# Patient Record
Sex: Male | Born: 1935
Health system: Southern US, Community
[De-identification: ages and names within clinical notes are randomized; demographics above are authoritative.]

## PROBLEM LIST (undated history)

## (undated) DIAGNOSIS — K909 Intestinal malabsorption, unspecified: Secondary | ICD-10-CM

## (undated) DIAGNOSIS — H409 Unspecified glaucoma: Secondary | ICD-10-CM

## (undated) DIAGNOSIS — C169 Malignant neoplasm of stomach, unspecified: Secondary | ICD-10-CM

## (undated) DIAGNOSIS — Z86718 Personal history of other venous thrombosis and embolism: Secondary | ICD-10-CM

## (undated) DIAGNOSIS — C801 Malignant (primary) neoplasm, unspecified: Secondary | ICD-10-CM

## (undated) DIAGNOSIS — I2510 Atherosclerotic heart disease of native coronary artery without angina pectoris: Secondary | ICD-10-CM

## (undated) DIAGNOSIS — R410 Disorientation, unspecified: Secondary | ICD-10-CM

## (undated) HISTORY — PX: HERNIA REPAIR: SHX51

## (undated) HISTORY — PX: STOMACH SURGERY: SHX791

---
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
## 1985-10-16 DIAGNOSIS — C801 Malignant (primary) neoplasm, unspecified: Secondary | ICD-10-CM

## 1985-10-16 HISTORY — PX: STOMACH SURGERY: SHX791

## 1985-10-16 HISTORY — DX: Malignant (primary) neoplasm, unspecified: C80.1

## 2007-08-01 ENCOUNTER — Ambulatory Visit: Payer: Self-pay | Admitting: Family Medicine

## 2010-03-19 ENCOUNTER — Ambulatory Visit: Payer: Self-pay | Admitting: Internal Medicine

## 2011-10-18 ENCOUNTER — Ambulatory Visit: Payer: Self-pay | Admitting: Surgery

## 2011-10-18 LAB — CBC WITH DIFFERENTIAL/PLATELET
Basophil %: 0.7 %
Eosinophil #: 0.1 10*3/uL (ref 0.0–0.7)
MCH: 30 pg (ref 26.0–34.0)
MCHC: 32 g/dL (ref 32.0–36.0)
Monocyte #: 0.7 10*3/uL (ref 0.0–0.7)
Monocyte %: 10.8 %
Neutrophil #: 4 10*3/uL (ref 1.4–6.5)
Neutrophil %: 62.5 %
RDW: 13.1 % (ref 11.5–14.5)

## 2011-10-18 LAB — BASIC METABOLIC PANEL
Anion Gap: 6 — ABNORMAL LOW (ref 7–16)
Calcium, Total: 9.1 mg/dL (ref 8.5–10.1)
Co2: 31 mmol/L (ref 21–32)
Creatinine: 0.81 mg/dL (ref 0.60–1.30)
EGFR (African American): >60
EGFR (Non-African Amer.): >60
Osmolality: 280 (ref 275–301)
Sodium: 138 mmol/L (ref 136–145)

## 2011-10-19 ENCOUNTER — Ambulatory Visit: Payer: Self-pay | Admitting: Anesthesiology

## 2011-10-19 DIAGNOSIS — Z01810 Encounter for preprocedural cardiovascular examination: Secondary | ICD-10-CM

## 2011-10-23 ENCOUNTER — Ambulatory Visit: Payer: Self-pay | Admitting: Surgery

## 2012-12-18 ENCOUNTER — Ambulatory Visit: Payer: Self-pay | Admitting: Ophthalmology

## 2013-11-20 DIAGNOSIS — H6120 Impacted cerumen, unspecified ear: Secondary | ICD-10-CM | POA: Diagnosis not present

## 2013-12-04 DIAGNOSIS — H4010X0 Unspecified open-angle glaucoma, stage unspecified: Secondary | ICD-10-CM | POA: Diagnosis not present

## 2014-02-02 DIAGNOSIS — H4010X0 Unspecified open-angle glaucoma, stage unspecified: Secondary | ICD-10-CM | POA: Diagnosis not present

## 2014-02-02 DIAGNOSIS — H2510 Age-related nuclear cataract, unspecified eye: Secondary | ICD-10-CM | POA: Diagnosis not present

## 2014-02-11 ENCOUNTER — Ambulatory Visit: Payer: Self-pay | Admitting: Ophthalmology

## 2014-02-11 DIAGNOSIS — Z85028 Personal history of other malignant neoplasm of stomach: Secondary | ICD-10-CM | POA: Diagnosis not present

## 2014-02-11 DIAGNOSIS — K909 Intestinal malabsorption, unspecified: Secondary | ICD-10-CM | POA: Diagnosis not present

## 2014-02-11 DIAGNOSIS — H2510 Age-related nuclear cataract, unspecified eye: Secondary | ICD-10-CM | POA: Diagnosis not present

## 2014-02-11 DIAGNOSIS — H259 Unspecified age-related cataract: Secondary | ICD-10-CM | POA: Diagnosis not present

## 2014-02-11 DIAGNOSIS — H4010X0 Unspecified open-angle glaucoma, stage unspecified: Secondary | ICD-10-CM | POA: Diagnosis not present

## 2014-02-11 DIAGNOSIS — Z87891 Personal history of nicotine dependence: Secondary | ICD-10-CM | POA: Diagnosis not present

## 2014-02-11 DIAGNOSIS — Z79899 Other long term (current) drug therapy: Secondary | ICD-10-CM | POA: Diagnosis not present

## 2014-02-11 DIAGNOSIS — H409 Unspecified glaucoma: Secondary | ICD-10-CM | POA: Diagnosis not present

## 2014-05-11 DIAGNOSIS — L821 Other seborrheic keratosis: Secondary | ICD-10-CM | POA: Diagnosis not present

## 2014-05-11 DIAGNOSIS — D485 Neoplasm of uncertain behavior of skin: Secondary | ICD-10-CM | POA: Diagnosis not present

## 2014-06-05 DIAGNOSIS — H4010X0 Unspecified open-angle glaucoma, stage unspecified: Secondary | ICD-10-CM | POA: Diagnosis not present

## 2014-06-16 DIAGNOSIS — C44721 Squamous cell carcinoma of skin of unspecified lower limb, including hip: Secondary | ICD-10-CM | POA: Diagnosis not present

## 2014-07-21 DIAGNOSIS — H4011X3 Primary open-angle glaucoma, severe stage: Secondary | ICD-10-CM | POA: Diagnosis not present

## 2014-09-04 DIAGNOSIS — H4011X3 Primary open-angle glaucoma, severe stage: Secondary | ICD-10-CM | POA: Diagnosis not present

## 2014-10-02 DIAGNOSIS — H4011X3 Primary open-angle glaucoma, severe stage: Secondary | ICD-10-CM | POA: Diagnosis not present

## 2014-12-02 DIAGNOSIS — H9319 Tinnitus, unspecified ear: Secondary | ICD-10-CM | POA: Diagnosis not present

## 2014-12-02 DIAGNOSIS — H903 Sensorineural hearing loss, bilateral: Secondary | ICD-10-CM | POA: Diagnosis not present

## 2014-12-02 DIAGNOSIS — H6120 Impacted cerumen, unspecified ear: Secondary | ICD-10-CM | POA: Diagnosis not present

## 2014-12-04 DIAGNOSIS — H4011X3 Primary open-angle glaucoma, severe stage: Secondary | ICD-10-CM | POA: Diagnosis not present

## 2015-01-19 DIAGNOSIS — H4011X3 Primary open-angle glaucoma, severe stage: Secondary | ICD-10-CM | POA: Diagnosis not present

## 2015-02-05 DIAGNOSIS — H4011X3 Primary open-angle glaucoma, severe stage: Secondary | ICD-10-CM | POA: Diagnosis not present

## 2015-05-04 DIAGNOSIS — L821 Other seborrheic keratosis: Secondary | ICD-10-CM | POA: Diagnosis not present

## 2015-06-01 DIAGNOSIS — H4011X3 Primary open-angle glaucoma, severe stage: Secondary | ICD-10-CM | POA: Diagnosis not present

## 2015-06-03 DIAGNOSIS — L821 Other seborrheic keratosis: Secondary | ICD-10-CM | POA: Diagnosis not present

## 2016-01-19 DIAGNOSIS — H903 Sensorineural hearing loss, bilateral: Secondary | ICD-10-CM | POA: Diagnosis not present

## 2016-01-19 DIAGNOSIS — H6123 Impacted cerumen, bilateral: Secondary | ICD-10-CM | POA: Diagnosis not present

## 2016-01-30 ENCOUNTER — Encounter: Payer: Self-pay | Admitting: *Deleted

## 2016-01-30 ENCOUNTER — Ambulatory Visit
Admission: EM | Admit: 2016-01-30 | Discharge: 2016-01-30 | Disposition: A | Discharge status: Home / Self Care | Payer: Medicare Other | Attending: Family Medicine | Admitting: Family Medicine

## 2016-01-30 DIAGNOSIS — W57XXXA Bitten or stung by nonvenomous insect and other nonvenomous arthropods, initial encounter: Secondary | ICD-10-CM

## 2016-01-30 DIAGNOSIS — T148 Other injury of unspecified body region: Secondary | ICD-10-CM

## 2016-01-30 HISTORY — DX: Malignant neoplasm of stomach, unspecified: C16.9

## 2016-01-30 HISTORY — DX: Personal history of other venous thrombosis and embolism: Z86.718

## 2016-01-30 HISTORY — DX: Unspecified glaucoma: H40.9

## 2016-01-30 HISTORY — DX: Intestinal malabsorption, unspecified: K90.9

## 2016-01-30 MED ORDER — DOXYCYCLINE HYCLATE 100 MG PO CAPS: 100.0000 mg | ORAL_CAPSULE | Freq: Two times a day (BID) | ORAL | Status: DC

## 2016-01-30 NOTE — ED Provider Notes (Signed)
CSN: [id number]     Arrival date & time 01/30/16  1427 History   First MD Initiated Contact with Patient 01/30/16 1559     Chief Complaint  Patient presents with  . Insect Bite   (Consider location/radiation/quality/duration/timing/severity/associated sxs/prior Treatment) HPI: Patient presents today with history of tick bites. Patient states that he has one on his left axillary area into on his right posterior thigh. Patient denies any fever, chills, joint pain, nausea, headache, vomiting. He states that the area on his right posterior thigh looks like there is a ring forming. 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 He states that he removed the tick's on these areas starting last week.  Past Medical History  Diagnosis Date  . Glaucoma   . Stomach cancer ([place])   . Malabsorption syndrome   . History of blood clots    Past Surgical History  Procedure Laterality Date  . Stomach surgery    . Hernia repair     No family history on file. Social History  Substance Use Topics  . Smoking status: Former Research scientist (life sciences)  . Smokeless tobacco: None  . Alcohol Use: Yes    Review of Systems: Negative except mentioned above.  Allergies  Review of patient's allergies indicates no known allergies.  Home Medications   Prior to Admission medications   Medication Sig Start Date End Date Taking? Authorizing Provider  doxycycline (VIBRAMYCIN) 100 MG capsule Take 1 capsule (100 mg total) by mouth 2 (two) times daily. 01/30/16   Paulina Fusi, MD   Meds Ordered and Administered this Visit  Medications - No data to display  BP 136/78 mmHg  Pulse 72  Temp(Src) 97.4 F (36.3 C) (Oral)  Ht 5\' 10"  (1.778 m)  Wt 150 lb (68.04 kg)  BMI 21.52 kg/m2  SpO2 98% No data found.   Physical Exam   GENERAL: NAD HEENT: no pharyngeal erythema, no exudate RESP: CTA B CARD: RRR SKIN: dime sized erythematous slightly raised area in the left axillary area, no drainage from the site, no streaks,  approximately 1.5" x 1.5" circular erythematous area on right posterior thigh, no central clearing, no drainage from site no streaks from site, there were no ticks noted on the body today.   ED Course  Procedures (including critical care time)  Labs Review Labs Reviewed - No data to display  Imaging Review No results found.    MDM   1. Tick bite   Will treat patient with Doxycycline for 10 days, keep area clean and dry, if symptoms do develop I do recommend that patient follow up with his primary care physician or seek other medical attention. Patient addresses understanding of plan. I have also encouraged him to do skin checks daily if he is outdoors frequently around ticks.    Paulina Fusi, MD 01/30/16 [phone]

## 2016-01-30 NOTE — ED Notes (Signed)
Pt states that he has 3 tick bites, one under his left arm, 2 on back of his left thigh.  First tick was found last week.  Pt states that there is a red ring around one of the tick bites

## 2016-08-11 DIAGNOSIS — H401133 Primary open-angle glaucoma, bilateral, severe stage: Secondary | ICD-10-CM | POA: Diagnosis not present

## 2016-08-23 DIAGNOSIS — D485 Neoplasm of uncertain behavior of skin: Secondary | ICD-10-CM | POA: Diagnosis not present

## 2016-08-23 DIAGNOSIS — C44729 Squamous cell carcinoma of skin of left lower limb, including hip: Secondary | ICD-10-CM | POA: Diagnosis not present

## 2016-08-23 DIAGNOSIS — L570 Actinic keratosis: Secondary | ICD-10-CM | POA: Diagnosis not present

## 2016-09-12 DIAGNOSIS — C44729 Squamous cell carcinoma of skin of left lower limb, including hip: Secondary | ICD-10-CM | POA: Diagnosis not present

## 2016-09-19 DIAGNOSIS — C44729 Squamous cell carcinoma of skin of left lower limb, including hip: Secondary | ICD-10-CM | POA: Diagnosis not present

## 2016-10-30 DIAGNOSIS — D485 Neoplasm of uncertain behavior of skin: Secondary | ICD-10-CM | POA: Diagnosis not present

## 2016-10-30 DIAGNOSIS — C44722 Squamous cell carcinoma of skin of right lower limb, including hip: Secondary | ICD-10-CM | POA: Diagnosis not present

## 2017-01-24 DIAGNOSIS — C44722 Squamous cell carcinoma of skin of right lower limb, including hip: Secondary | ICD-10-CM | POA: Diagnosis not present

## 2017-02-12 DIAGNOSIS — H903 Sensorineural hearing loss, bilateral: Secondary | ICD-10-CM | POA: Diagnosis not present

## 2017-02-12 DIAGNOSIS — H6123 Impacted cerumen, bilateral: Secondary | ICD-10-CM | POA: Diagnosis not present

## 2017-06-20 ENCOUNTER — Encounter: Payer: Self-pay | Admitting: *Deleted

## 2017-06-20 ENCOUNTER — Ambulatory Visit
Admission: EM | Admit: 2017-06-20 | Discharge: 2017-06-20 | Disposition: A | Discharge status: Home / Self Care | Payer: Medicare Other | Attending: Family Medicine | Admitting: Family Medicine

## 2017-06-20 DIAGNOSIS — N401 Enlarged prostate with lower urinary tract symptoms: Secondary | ICD-10-CM | POA: Diagnosis not present

## 2017-06-20 DIAGNOSIS — R350 Frequency of micturition: Secondary | ICD-10-CM | POA: Insufficient documentation

## 2017-06-20 DIAGNOSIS — Z87891 Personal history of nicotine dependence: Secondary | ICD-10-CM | POA: Insufficient documentation

## 2017-06-20 DIAGNOSIS — Z86718 Personal history of other venous thrombosis and embolism: Secondary | ICD-10-CM | POA: Diagnosis not present

## 2017-06-20 DIAGNOSIS — R300 Dysuria: Secondary | ICD-10-CM | POA: Diagnosis present

## 2017-06-20 DIAGNOSIS — K909 Intestinal malabsorption, unspecified: Secondary | ICD-10-CM | POA: Insufficient documentation

## 2017-06-20 DIAGNOSIS — Z85028 Personal history of other malignant neoplasm of stomach: Secondary | ICD-10-CM | POA: Insufficient documentation

## 2017-06-20 DIAGNOSIS — H409 Unspecified glaucoma: Secondary | ICD-10-CM | POA: Diagnosis not present

## 2017-06-20 DIAGNOSIS — N400 Enlarged prostate without lower urinary tract symptoms: Secondary | ICD-10-CM

## 2017-06-20 LAB — URINALYSIS, COMPLETE (UACMP) WITH MICROSCOPIC
Bacteria, UA: NONE SEEN
Bilirubin Urine: NEGATIVE
Glucose, UA: NEGATIVE mg/dL
HGB URINE DIPSTICK: NEGATIVE
KETONES UR: NEGATIVE mg/dL
LEUKOCYTES UA: NEGATIVE
NITRITE: NEGATIVE
Protein, ur: NEGATIVE mg/dL
RBC / HPF: NONE SEEN RBC/hpf (ref 0–5)
SQUAMOUS EPITHELIAL / LPF: NONE SEEN
Specific Gravity, Urine: 1.02 (ref 1.005–1.030)
pH: 7.5 (ref 5.0–8.0)

## 2017-06-20 NOTE — ED Triage Notes (Signed)
Patient started having symptoms of urinary frequency and burning 2 weeks ago.

## 2017-06-20 NOTE — ED Provider Notes (Addendum)
[facility]    CSN: [id number] Arrival date & time: 06/20/17  1257     History   Chief Complaint Chief Complaint  Patient presents with  . Urinary Frequency    HPI Brian Ferguson is a 83 y.o. male.   The history is provided by the patient.  Urinary Frequency  This is a new problem. The current episode started more than 1 week ago. The problem occurs constantly. The problem has not changed since onset.Pertinent negatives include no chest pain, no abdominal pain, no headaches and no shortness of breath. Associated symptoms comments: Dysuria; denies fevers, chills, hematuria.    Past Medical History:  Diagnosis Date  . Glaucoma   . History of blood clots   . Malabsorption syndrome   . Stomach cancer ([place])     There are no active problems to display for this patient.   Past Surgical History:  Procedure Laterality Date  . HERNIA REPAIR    . STOMACH SURGERY         Home Medications    Prior to Admission medications   Medication Sig Start Date End Date Taking? Authorizing Provider  doxycycline (VIBRAMYCIN) 100 MG capsule Take 1 capsule (100 mg total) by mouth 2 (two) times daily. 01/30/16   Paulina Fusi, MD    Family History History reviewed. No pertinent family history.  Social History Social History  Substance Use Topics  . Smoking status: Former Research scientist (life sciences)  . Smokeless tobacco: Never Used  . Alcohol use Yes     Allergies   Patient has no known allergies.   Review of Systems Review of Systems  Respiratory: Negative for shortness of breath.   Cardiovascular: Negative for chest pain.  Gastrointestinal: Negative for abdominal pain.  Genitourinary: Positive for frequency.  Neurological: Negative for headaches.     Physical Exam Triage Vital Signs ED Triage Vitals  Enc Vitals Group     BP 06/20/17 1421 (!) 153/68     Pulse Rate 06/20/17 1421 71     Resp 06/20/17 1421 16     Temp 06/20/17 1421 97.6 F (36.4 C)     Temp Source 06/20/17  1421 Oral     SpO2 06/20/17 1421 100 %     Weight 06/20/17 1424 145 lb (65.8 kg)     Height 06/20/17 1424 5\' 10"  (1.778 m)     Head Circumference --      Peak Flow --      Pain Score 06/20/17 1424 0     Pain Loc --      Pain Edu? --      Excl. in [place]? --    No data found.   Updated Vital Signs BP (!) 153/68 (BP Location: Left Arm)   Pulse 71   Temp 97.6 F (36.4 C) (Oral)   Resp 16   Ht 5\' 10"  (1.778 m)   Wt 145 lb (65.8 kg)   SpO2 100%   BMI 20.81 kg/m   Visual Acuity Right Eye Distance:   Left Eye Distance:   Bilateral Distance:    Right Eye Near:   Left Eye Near:    Bilateral Near:     Physical Exam  Constitutional: He appears well-developed and well-nourished. No distress.  Abdominal: Soft. Bowel sounds are normal. He exhibits no distension and no mass. There is no tenderness. There is no rebound and no guarding. No hernia.  Genitourinary: Prostate is enlarged. Prostate is not tender.  Skin: He is not diaphoretic.  Nursing note  and vitals reviewed.    UC Treatments / Results  Labs (all labs ordered are listed, but only abnormal results are displayed) Labs Reviewed  URINALYSIS, COMPLETE (UACMP) WITH MICROSCOPIC    EKG  EKG Interpretation None       Radiology No results found.  Procedures Procedures (including critical care time)  Medications Ordered in UC Medications - No data to display   Initial Impression / Assessment and Plan / UC Course  I have reviewed the triage vital signs and the nursing notes.  Pertinent labs & imaging results that were available during my care of the patient were reviewed by me and considered in my medical decision making (see chart for details).       Final Clinical Impressions(s) / UC Diagnoses   Final diagnoses:  Benign prostatic hyperplasia, unspecified whether lower urinary tract symptoms present    New Prescriptions Discharge Medication List as of 06/20/2017  2:49 PM     1. Lab result (normal UA)  and diagnosis reviewed with patient 2.patient refuses medication for BPH; states he will take otc saw palmetto instead 3. Follow-up prn if symptoms worsen or don't improve  Controlled Substance Prescriptions [place] Controlled Substance Registry consulted? Not Applicable   Norval Gable, MD 06/20/17 [place], [place], MD 06/20/17 [phone]

## 2017-12-13 DIAGNOSIS — H401133 Primary open-angle glaucoma, bilateral, severe stage: Secondary | ICD-10-CM | POA: Diagnosis not present

## 2018-01-16 DIAGNOSIS — H353132 Nonexudative age-related macular degeneration, bilateral, intermediate dry stage: Secondary | ICD-10-CM | POA: Diagnosis not present

## 2018-01-16 DIAGNOSIS — H401133 Primary open-angle glaucoma, bilateral, severe stage: Secondary | ICD-10-CM | POA: Diagnosis not present

## 2018-02-28 DIAGNOSIS — L570 Actinic keratosis: Secondary | ICD-10-CM | POA: Diagnosis not present

## 2018-04-22 DIAGNOSIS — H401133 Primary open-angle glaucoma, bilateral, severe stage: Secondary | ICD-10-CM | POA: Diagnosis not present

## 2018-04-26 ENCOUNTER — Ambulatory Visit (INDEPENDENT_AMBULATORY_CARE_PROVIDER_SITE_OTHER): Payer: Medicare Other | Admitting: Podiatry

## 2018-04-26 DIAGNOSIS — B351 Tinea unguium: Secondary | ICD-10-CM

## 2018-04-26 DIAGNOSIS — L989 Disorder of the skin and subcutaneous tissue, unspecified: Secondary | ICD-10-CM | POA: Diagnosis not present

## 2018-04-26 DIAGNOSIS — M79676 Pain in unspecified toe(s): Secondary | ICD-10-CM

## 2018-04-29 NOTE — Progress Notes (Signed)
    Subjective: Patient is a 84 y.o. male presenting to the office today as a new patient with a chief complaint of a painful callus lesion to the right sub-fifth MPJ that has been present for several years. Walking increases the pain. He has tried to trim the callus himself unsuccessfully.   Patient also complains of elongated, thickened nails that cause pain while ambulating in shoes. He is unable to trim his own nails. Patient presents today for further treatment and evaluation.  Past Medical History:  Diagnosis Date  . Glaucoma   . History of blood clots   . Malabsorption syndrome   . Stomach cancer ([place])     Objective:  Physical Exam General: Alert and oriented x3 in no acute distress  Dermatology: Hyperkeratotic lesion present on the right sub-fifth MPJ. Pain on palpation with a central nucleated core noted. Skin is warm, dry and supple bilateral lower extremities. Negative for open lesions or macerations. Nails are tender, long, thickened and dystrophic with subungual debris, consistent with onychomycosis, 1-5 bilateral. No signs of infection noted.  Vascular: Palpable pedal pulses bilaterally. No edema or erythema noted. Capillary refill within normal limits.  Neurological: Epicritic and protective threshold grossly intact bilaterally.   Musculoskeletal Exam: Pain on palpation at the keratotic lesion noted. Range of motion within normal limits bilateral. Muscle strength 5/5 in all groups bilateral.  Assessment: 1. Onychodystrophic nails 1-5 bilateral with hyperkeratosis of nails.  2. Onychomycosis of nail due to dermatophyte bilateral 3. Porokeratosis noted to the right sib-fifth MPJ   Plan of Care:  1. Patient evaluated. 2. Excisional debridement of keratoic lesion using a chisel blade was performed without incident.  3. Dressed with light dressing. 4. Mechanical debridement of nails 1-5 bilaterally performed using a nail nipper. Filed with dremel without incident.  5.  Patient is to return to the clinic as needed.   Edrick Kins, DPM [facility]  Dr. Edrick Kins, [place]                                        [place] [place], [place] [address]                Office [phone]  Fax [phone]

## 2018-05-11 ENCOUNTER — Ambulatory Visit
Admission: EM | Admit: 2018-05-11 | Discharge: 2018-05-11 | Disposition: A | Discharge status: Home / Self Care | Payer: Medicare Other | Attending: Family Medicine | Admitting: Family Medicine

## 2018-05-11 ENCOUNTER — Ambulatory Visit: Payer: Medicare Other

## 2018-05-11 ENCOUNTER — Other Ambulatory Visit: Payer: Self-pay

## 2018-05-11 ENCOUNTER — Encounter: Payer: Self-pay | Admitting: Emergency Medicine

## 2018-05-11 DIAGNOSIS — Z87891 Personal history of nicotine dependence: Secondary | ICD-10-CM | POA: Insufficient documentation

## 2018-05-11 DIAGNOSIS — S8001XA Contusion of right knee, initial encounter: Secondary | ICD-10-CM

## 2018-05-11 DIAGNOSIS — S0081XA Abrasion of other part of head, initial encounter: Secondary | ICD-10-CM

## 2018-05-11 DIAGNOSIS — S022XXA Fracture of nasal bones, initial encounter for closed fracture: Secondary | ICD-10-CM | POA: Insufficient documentation

## 2018-05-11 DIAGNOSIS — M25561 Pain in right knee: Secondary | ICD-10-CM | POA: Diagnosis not present

## 2018-05-11 DIAGNOSIS — W19XXXA Unspecified fall, initial encounter: Secondary | ICD-10-CM | POA: Insufficient documentation

## 2018-05-11 DIAGNOSIS — S0181XA Laceration without foreign body of other part of head, initial encounter: Secondary | ICD-10-CM | POA: Diagnosis not present

## 2018-05-11 DIAGNOSIS — S0083XA Contusion of other part of head, initial encounter: Secondary | ICD-10-CM | POA: Diagnosis not present

## 2018-05-11 DIAGNOSIS — M1711 Unilateral primary osteoarthritis, right knee: Secondary | ICD-10-CM | POA: Diagnosis not present

## 2018-05-11 DIAGNOSIS — Z23 Encounter for immunization: Secondary | ICD-10-CM | POA: Diagnosis not present

## 2018-05-11 DIAGNOSIS — H409 Unspecified glaucoma: Secondary | ICD-10-CM | POA: Insufficient documentation

## 2018-05-11 DIAGNOSIS — Y9301 Activity, walking, marching and hiking: Secondary | ICD-10-CM | POA: Insufficient documentation

## 2018-05-11 DIAGNOSIS — S0003XA Contusion of scalp, initial encounter: Secondary | ICD-10-CM | POA: Diagnosis not present

## 2018-05-11 DIAGNOSIS — G9389 Other specified disorders of brain: Secondary | ICD-10-CM | POA: Insufficient documentation

## 2018-05-11 DIAGNOSIS — R51 Headache: Secondary | ICD-10-CM | POA: Diagnosis present

## 2018-05-11 MED ORDER — MUPIROCIN 2 % EX OINT: TOPICAL_OINTMENT | CUTANEOUS | 0 refills | Status: DC

## 2018-05-11 MED ORDER — TETANUS-DIPHTHERIA TOXOIDS TD 5-2 LFU IM INJ
0.5000 mL | INJECTION | Freq: Once | INTRAMUSCULAR | Status: AC
  Administered 2018-05-11: 0.5 mL via INTRAMUSCULAR

## 2018-05-11 MED ORDER — TETANUS-DIPHTH-ACELL PERTUSSIS 5-2.5-18.5 LF-MCG/0.5 IM SUSP: 0.5000 mL | Freq: Once | INTRAMUSCULAR | Status: DC

## 2018-05-11 NOTE — ED Provider Notes (Signed)
[facility] ____________________________________________  Time seen: Approximately 9518 AM  I have reviewed the triage vital signs and the nursing notes.   HISTORY  Chief Complaint Fall  HPI Brian Ferguson is a 84 y.o. male presenting with significant other and daughter at bedside for evaluation after fall that occurred approximately 1 hour prior to arrival.  Patient reports that he is a very active person and regularly jogs and exercises outside.  States he has not been running as much recently due to the higher heat levels, and states last week he was at the beach.  States that today he wanted to start back in his exercise regimen.  States that he went for a 1 mile jog and felt fine during the jog except for he felt both of his legs getting sore, and he feels that he overdid his running.  States when he got back home and he was walking to his house in the driveway his right knee gave out some causing him to fall forward.  States that he tried to catch himself but was unable to do so quick enough and hit his left face on the concrete and grass.  States that he did also hit his right knee and having some minor discomfort to the knee but does not really say a pain.  States left wrist feels fine.  Daughter states that he earlier said he had a mild headache, patient denies any headache at this time.  History of glaucoma, denies any acute vision loss or change.  Denies any unsteady gait, loss of consciousness, confusion, nausea, vomiting, neck or back pain, other extremity pain or paresthesias.  Daughter reports that he is continued to act normally since the event and is continue to remain ambulatory.  Patient states he felt completely fine before his  right knee gave out.  States that he has a previous cartilage injury to his right knee and he does sometimes have similar issues with his knee,   And states that this is the only reason he fell.   Denies chest pain, shortness of breath,  paresthesias, weakness,abdominal pain, dysuria, or rash. Denies recent sickness. Denies recent antibiotic use.  Unsure last tetanus immunization.  Juline Patch, MD: PCP    Past Medical History:  Diagnosis Date  . Glaucoma   . History of blood clots   . Malabsorption syndrome   . Stomach cancer ([place])     There are no active problems to display for this patient.   Past Surgical History:  Procedure Laterality Date  . HERNIA REPAIR    . STOMACH SURGERY       No current facility-administered medications for this encounter.   Current Outpatient Medications:  .  mupirocin ointment (BACTROBAN) 2 %, Apply two times a day for 7 days., Disp: 22 g, Rfl: 0  Allergies Patient has no known allergies.  History reviewed. No pertinent family history.  Social History Social History   Tobacco Use  . Smoking status: Former Research scientist (life sciences)  . Smokeless tobacco: Never Used  Substance Use Topics  . Alcohol use: Yes  . Drug use: No    Review of Systems Constitutional: No fever Eyes: No visual changes. ENT: No sore throat. Cardiovascular: Denies chest pain. Respiratory: Denies shortness of breath. Gastrointestinal: No abdominal pain.  No nausea, no vomiting.  No diarrhea.  No constipation. Genitourinary: Negative for dysuria. Musculoskeletal: Negative for back pain. Skin: Abrasions to face. Neurological: Negative for focal weakness or numbness.   ____________________________________________   PHYSICAL  EXAM:  VITAL SIGNS: ED Triage Vitals  Enc Vitals Group     BP 05/11/18 1020 (!) 150/86     Pulse Rate 05/11/18 1020 74     Resp 05/11/18 1020 18     Temp 05/11/18 1020 97.9 F (36.6 C)     Temp Source 05/11/18 1020 Oral     SpO2 05/11/18 1020 99 %     Weight 05/11/18 1017 145 lb (65.8 kg)     Height 05/11/18 1017 5\' 11"  (1.803 m)     Head Circumference --      Peak Flow --      Pain Score 05/11/18 1017 0     Pain Loc --      Pain Edu? --      Excl. in [place]? --      Constitutional: Alert and oriented. Well appearing and in no acute distress. Eyes: Conjunctivae are normal. PERRL. EOMI. ENT      Head: Normocephalic.  Left frontal mild hematoma, nontender superficial abrasions.  Left maxillary also with superficial abrasions and mild ecchymosis, nontender.  Nasal bridge with superficial abrasions, mild left medial tenderness.  No other facial tenderness noted.      Nose: As above.  No epistaxis.  Dried blood around external nares.      Mouth/Throat: Mucous membranes are moist.Oropharynx non-erythematous.No dental injury noted.  Neck: No stridor. Supple without meningismus.  Hematological/Lymphatic/Immunilogical: No cervical lymphadenopathy. Cardiovascular: Normal rate, regular rhythm. Grossly normal heart sounds.  Good peripheral circulation. Respiratory: Normal respiratory effort without tachypnea nor retractions. Breath sounds are clear and equal bilaterally. No wheezes, rales, rhonchi. Gastrointestinal: Soft and nontender.  Musculoskeletal: Nontender with normal range of motion in all extremities. No midline cervical, thoracic or lumbar tenderness to palpation. Bilateral distal radial and pedal pulses equal and easily palpated.  Minimal tenderness to right superior medial knee with superficial abrasion noted, full range of motion present, knee stable, no medial or lateral stress, no pain with anterior posterior drawer test.  Left forearm nontender. Neurologic:  Normal speech and language. No gross focal neurologic deficits are appreciated. Speech is normal. No gait instability. Steady gait. No paresthesias.  Skin:  Skin is warm, dry. Abrasions to left face.  Psychiatric: Mood and affect are normal. Speech and behavior are normal. Patient exhibits appropriate insight and judgment   ___________________________________________   LABS (all labs ordered are listed, but only abnormal results are displayed)  Labs Reviewed - No data to  display  RADIOLOGY  Ct Head Wo Contrast  Result Date: 05/11/2018 CLINICAL DATA:  Status post fall with laceration to the face and trauma to the head. EXAM: CT HEAD WITHOUT CONTRAST CT MAXILLOFACIAL WITHOUT CONTRAST TECHNIQUE: Multidetector CT imaging of the head and maxillofacial structures were performed using the standard protocol without intravenous contrast. Multiplanar CT image reconstructions of the maxillofacial structures were also generated. COMPARISON:  None. FINDINGS: CT HEAD FINDINGS Brain: No evidence of acute infarction, hemorrhage, or extra-axial collection or mass lesion/mass effect. The lateral ventricles are dilated out of proportion to the amount of volume loss of the brain parenchyma. Encephalomalacia in the periventricular white matter of the right frontal lobe may represent asymmetric microangiopathy or prior ischemic infarction. Vascular: Calcific atherosclerotic disease of the intra cavernous carotid arteries. Skull: Normal. Negative for fracture or focal lesion. Other: Bilateral cataract surgery. CT MAXILLOFACIAL FINDINGS Osseous: Fragmentation of the left nasal bone likely represents minimally displaced comminuted fracture. Orbits: Negative. No traumatic or inflammatory finding. Sinuses: Clear. Soft tissues: Left frontal scalp  hematoma IMPRESSION: No evidence of acute intracranial traumatic injury. Lateral ventricles are dilated out of proportion to the amount of brain parenchymal volume loss, which may be seen with normal pressure hydrocephalus. Comparison to prior exams, if available, will be most helpful. Encephalomalacia in the periventricular white matter of the right frontal lobe may represent asymmetric microangiopathy or prior ischemic infarction. Suspected minimally displaced comminuted left nasal bone fracture. Left frontal scalp hematoma. Electronically Signed   By: Fidela Salisbury M.D.   On: 05/11/2018 11:58   Dg Knee Complete 4 Views Right  Result Date:  05/11/2018 CLINICAL DATA:  Right knee pain after fall EXAM: RIGHT KNEE - COMPLETE 4+ VIEW COMPARISON:  None. FINDINGS: No fracture or dislocation. Soft tissue thickening in the region of the quadriceps tendon without appreciable joint effusion. No suspicious focal osseous lesion. Small superior patellar enthesophyte. Mild tricompartmental osteoarthritis, most prominent in the medial compartment. Vascular calcifications throughout the posterior soft tissues without radiopaque foreign body. IMPRESSION: 1. No right knee fracture, dislocation or joint effusion. 2. Soft tissue thickening in the region of the quadriceps tendon. 3. Mild tricompartmental right knee osteoarthritis. Electronically Signed   By: Ilona Sorrel M.D.   On: 05/11/2018 11:25   Ct Maxillofacial Wo Contrast  Result Date: 05/11/2018 CLINICAL DATA:  Status post fall with laceration to the face and trauma to the head. EXAM: CT HEAD WITHOUT CONTRAST CT MAXILLOFACIAL WITHOUT CONTRAST TECHNIQUE: Multidetector CT imaging of the head and maxillofacial structures were performed using the standard protocol without intravenous contrast. Multiplanar CT image reconstructions of the maxillofacial structures were also generated. COMPARISON:  None. FINDINGS: CT HEAD FINDINGS Brain: No evidence of acute infarction, hemorrhage, or extra-axial collection or mass lesion/mass effect. The lateral ventricles are dilated out of proportion to the amount of volume loss of the brain parenchyma. Encephalomalacia in the periventricular white matter of the right frontal lobe may represent asymmetric microangiopathy or prior ischemic infarction. Vascular: Calcific atherosclerotic disease of the intra cavernous carotid arteries. Skull: Normal. Negative for fracture or focal lesion. Other: Bilateral cataract surgery. CT MAXILLOFACIAL FINDINGS Osseous: Fragmentation of the left nasal bone likely represents minimally displaced comminuted fracture. Orbits: Negative. No traumatic or  inflammatory finding. Sinuses: Clear. Soft tissues: Left frontal scalp hematoma IMPRESSION: No evidence of acute intracranial traumatic injury. Lateral ventricles are dilated out of proportion to the amount of brain parenchymal volume loss, which may be seen with normal pressure hydrocephalus. Comparison to prior exams, if available, will be most helpful. Encephalomalacia in the periventricular white matter of the right frontal lobe may represent asymmetric microangiopathy or prior ischemic infarction. Suspected minimally displaced comminuted left nasal bone fracture. Left frontal scalp hematoma. Electronically Signed   By: Fidela Salisbury M.D.   On: 05/11/2018 11:58   ____________________________________________   PROCEDURES Procedures   INITIAL IMPRESSION / ASSESSMENT AND PLAN / ED COURSE  Pertinent labs & imaging results that were available during my care of the patient were reviewed by me and considered in my medical decision making (see chart for details).  Presenting with significant other and daughter at bedside after a fall that occurred at home prior to arrival as above.  Patient at this time states that he feels fine.  Patient states that he fell only because his right knee gave out, and reports that this is history of similar in the past.  Denies any precipitating dizziness, chest pain, weakness, shortness of breath or other complaints.  Steady gait.  Discussed with patient with multiple abrasions and contusions noted  to, recommend CT eval of head and maxillofacial.  We will also evaluate right knee.  Tetanus immunization updated.  CT facial and head as above per radiologist.  Discussed these results in detail with patient and family as well as copy given.  Radiologist no evidence of acute intracranial  traumatic injury, lateral ventricle proportional abnormality and reports may be seen with normal pressure hydrocephalus as well as encephalomalacia right frontal lobe may be present  prior ischemic infarct.  Patient initially reports that he has no previous history of infarct, however reports he believes that he was told he had a mini stroke 30 years ago.  Recommend for close follow-up with primary care this coming week.  Also encouraged to follow-up with neurologist this week regarding the above other ct findings.  Encouraged ice, rest and supportive care over-the-counter Tylenol as needed.Patient states currently he feels fine and has no pain or complaints.  Discussed follow up with Primary care physician this week. Discussed follow up and return parameters including no resolution or any worsening concerns. Patient verbalized understanding and agreed to plan.   ____________________________________________   FINAL CLINICAL IMPRESSION(S) / ED DIAGNOSES  Final diagnoses:  Contusion of face, initial encounter  Closed fracture of nasal bone, initial encounter  Facial abrasion, initial encounter  Contusion of right knee, initial encounter     ED Discharge Orders        Ordered    mupirocin ointment (BACTROBAN) 2 %     05/11/18 1219       Note: This dictation was prepared with Dragon dictation along with smaller phrase technology. Any transcriptional errors that result from this process are unintentional.         Marylene Land, NP 05/11/18 1245

## 2018-05-11 NOTE — ED Triage Notes (Signed)
Patient was out jogging and fell in his driveway after his left leg "gave out" on him. He fell on his right knee, left wrist and hit his face. No LOC.

## 2018-05-11 NOTE — Discharge Instructions (Addendum)
Use medication as prescribed. Rest. Drink plenty of fluids.  Apply ice.  You need to follow-up closely with primary care as discussed.  Follow-up with neurology next week.  Return to Urgent care for new or worsening concerns.

## 2018-06-03 DIAGNOSIS — H401133 Primary open-angle glaucoma, bilateral, severe stage: Secondary | ICD-10-CM | POA: Diagnosis not present

## 2018-06-03 DIAGNOSIS — H47013 Ischemic optic neuropathy, bilateral: Secondary | ICD-10-CM | POA: Diagnosis not present

## 2018-06-11 DIAGNOSIS — E860 Dehydration: Secondary | ICD-10-CM | POA: Diagnosis not present

## 2018-06-11 DIAGNOSIS — H6121 Impacted cerumen, right ear: Secondary | ICD-10-CM | POA: Diagnosis not present

## 2018-06-11 DIAGNOSIS — H903 Sensorineural hearing loss, bilateral: Secondary | ICD-10-CM | POA: Diagnosis not present

## 2018-07-22 DIAGNOSIS — H401133 Primary open-angle glaucoma, bilateral, severe stage: Secondary | ICD-10-CM | POA: Diagnosis not present

## 2018-11-08 ENCOUNTER — Other Ambulatory Visit: Payer: Self-pay

## 2018-11-08 ENCOUNTER — Emergency Department: Payer: Medicare Other

## 2018-11-08 ENCOUNTER — Emergency Department
Admission: EM | Admit: 2018-11-08 | Discharge: 2018-11-08 | Disposition: A | Discharge status: Home / Self Care | Payer: Medicare Other | Attending: Emergency Medicine | Admitting: Emergency Medicine

## 2018-11-08 ENCOUNTER — Encounter: Payer: Self-pay | Admitting: Emergency Medicine

## 2018-11-08 DIAGNOSIS — M5489 Other dorsalgia: Secondary | ICD-10-CM | POA: Diagnosis present

## 2018-11-08 DIAGNOSIS — Z87891 Personal history of nicotine dependence: Secondary | ICD-10-CM | POA: Insufficient documentation

## 2018-11-08 DIAGNOSIS — M4726 Other spondylosis with radiculopathy, lumbar region: Secondary | ICD-10-CM | POA: Diagnosis not present

## 2018-11-08 DIAGNOSIS — M549 Dorsalgia, unspecified: Secondary | ICD-10-CM | POA: Diagnosis not present

## 2018-11-08 MED ORDER — TRAMADOL HCL 50 MG PO TABS: 50.0000 mg | ORAL_TABLET | Freq: Two times a day (BID) | ORAL | 0 refills | Status: DC | PRN

## 2018-11-08 MED ORDER — LIDOCAINE 5 % EX PTCH
1.0000 | MEDICATED_PATCH | CUTANEOUS | Status: DC
  Administered 2018-11-08: 1 via TRANSDERMAL
  Filled 2018-11-08: qty 1

## 2018-11-08 MED ORDER — LIDOCAINE 5 % EX PTCH: 1.0000 | MEDICATED_PATCH | Freq: Two times a day (BID) | CUTANEOUS | 0 refills | Status: DC

## 2018-11-08 MED ORDER — TRAMADOL HCL 50 MG PO TABS
50.0000 mg | ORAL_TABLET | Freq: Once | ORAL | Status: AC
  Administered 2018-11-08: 50 mg via ORAL
  Filled 2018-11-08: qty 1

## 2018-11-08 NOTE — Discharge Instructions (Addendum)
Advised to discontinue push-ups and sit ups until evaluated by orthopedic doctor.  Call orthopedic clinic to schedule an appointment.

## 2018-11-08 NOTE — ED Provider Notes (Signed)
[facility] Emergency Department Provider Note   ____________________________________________   First MD Initiated Contact with Patient 11/08/18 1341     (approximate)  I have reviewed the triage vital signs and the nursing notes.   HISTORY  Chief Complaint Back Pain    HPI Tiras Bianchini is a 85 y.o. male patient complain of lower left back pain increasing over the past 2 weeks.  He state pain increased with walking and moving.  Patient state mild trans-relief with heat application is applied also anti-inflammatory medication.  Patient's report he does a lot of squats and push-ups in order to keep healthy.  He state he did not believe that the push-ups and squats increases his back pain.  Patient states pain does increase with flexion.  Patient denies bladder or bowel dysfunction.  Patient said there is a radicular component down the left leg when walking.    Past Medical History:  Diagnosis Date  . Glaucoma   . History of blood clots   . Malabsorption syndrome   . Stomach cancer ([place])     There are no active problems to display for this patient.   Past Surgical History:  Procedure Laterality Date  . HERNIA REPAIR    . STOMACH SURGERY      Prior to Admission medications   Medication Sig Start Date End Date Taking? Authorizing Provider  lidocaine (LIDODERM) 5 % Place 1 patch onto the skin every 12 (twelve) hours. Remove & Discard patch within 12 hours or as directed by MD 11/08/18 11/08/19  Sable Feil, PA-C  mupirocin ointment (BACTROBAN) 2 % Apply two times a day for 7 days. 05/11/18   Marylene Land, NP  traMADol (ULTRAM) 50 MG tablet Take 1 tablet (50 mg total) by mouth every 12 (twelve) hours as needed. 11/08/18   Sable Feil, PA-C    Allergies Patient has no known allergies.  History reviewed. No pertinent family history.  Social History Social History   Tobacco Use  . Smoking status: Former Research scientist (life sciences)  . Smokeless tobacco: Never  Used  Substance Use Topics  . Alcohol use: Yes  . Drug use: No    Review of Systems Constitutional: No fever/chills Eyes: No visual changes. ENT: No sore throat. Cardiovascular: Denies chest pain. Respiratory: Denies shortness of breath. Gastrointestinal: No abdominal pain.  No nausea, no vomiting.  No diarrhea.  No constipation. Genitourinary: Negative for dysuria. Musculoskeletal: Positive for back pain. Skin: Negative for rash. Neurological: Negative for headaches, focal weakness or numbness.   ____________________________________________   PHYSICAL EXAM:  VITAL SIGNS: ED Triage Vitals  Enc Vitals Group     BP 11/08/18 1332 (!) 146/81     Pulse Rate 11/08/18 1332 79     Resp 11/08/18 1332 16     Temp 11/08/18 1332 97.9 F (36.6 C)     Temp Source 11/08/18 1332 Oral     SpO2 11/08/18 1332 97 %     Weight 11/08/18 1335 147 lb (66.7 kg)     Height 11/08/18 1335 5\' 10"  (1.778 m)     Head Circumference --      Peak Flow --      Pain Score 11/08/18 1334 4     Pain Loc --      Pain Edu? --      Excl. in [place]? --    Constitutional: Alert and oriented. Well appearing and in no acute distress. Neck: No stridor.  Hematological/Lymphatic/Immunilogical: No cervical lymphadenopathy. Cardiovascular: Normal rate,  regular rhythm. Grossly normal heart sounds.  Good peripheral circulation. Respiratory: Normal respiratory effort.  No retractions. Lungs CTAB. Gastrointestinal: Soft and nontender. No distention. No abdominal bruits. No CVA tenderness. Musculoskeletal: No lower extremity tenderness nor edema.  No joint effusions. Neurologic:  Normal speech and language. No gross focal neurologic deficits are appreciated. No gait instability. Skin:  Skin is warm, dry and intact. No rash noted. Psychiatric: Mood and affect are normal. Speech and behavior are normal.  ____________________________________________   LABS (all labs ordered are listed, but only abnormal results are  displayed)  Labs Reviewed - No data to display ____________________________________________  EKG   ____________________________________________  RADIOLOGY  ED MD interpretation:    Official radiology report(s): Dg Lumbar Spine 2-3 Views  Result Date: 11/08/2018 CLINICAL DATA:  Worsening back pain for the past 2 weeks. EXAM: LUMBAR SPINE - 2-3 VIEW COMPARISON:  None. FINDINGS: There are 5 non rib-bearing lumbar type vertebral bodies. Moderate to severe scoliotic curvature of the thoracolumbar spine with dominant mid component convex to the right measuring approximately 21 degrees (as from the superior endplate of E95 to the inferior endplate of L3). There is straightening expected lumbar lordosis. No anterolisthesis or retrolisthesis. Lumbar vertebral body heights appear preserved given obliquity. Moderate to severe multilevel lumbar spine DDD, worse at L1-L2 and L2-L3 with disc space height loss, endplate irregularity and sclerosis. Multiple surgical clips and enteric staples are seen within the upper abdomen. Moderate to large colonic stool burden without evidence of enteric obstruction. IMPRESSION: 1. Moderate to severe multilevel lumbar spine DDD, worse at L1-L2 and L2-L3. 2. Moderate to severe scoliotic curvature of the thoracolumbar spine. Electronically Signed   By: Sandi Mariscal M.D.   On: 11/08/2018 15:11    ____________________________________________   PROCEDURES  Procedure(s) performed:   Procedures  Critical Care performed: No  ____________________________________________   INITIAL IMPRESSION / ASSESSMENT AND PLAN / ED COURSE  As part of my medical decision making, I reviewed the following data within the [place] back pain secondary to degenerative disc disease.  Discussed x-ray findings with patient.  Patient given discharge care instructions.  Patient advised to follow-up with orthopedic for definitive evaluation and treatment.       ____________________________________________   FINAL CLINICAL IMPRESSION(S) / ED DIAGNOSES  Final diagnoses:  Osteoarthritis of spine with radiculopathy, lumbar region     ED Discharge Orders         Ordered    lidocaine (LIDODERM) 5 %  Every 12 hours     11/08/18 1523    traMADol (ULTRAM) 50 MG tablet  Every 12 hours PRN     11/08/18 1523           Note:  This document was prepared using Dragon voice recognition software and may include unintentional dictation errors.    Sable Feil, PA-C 11/08/18 1528    Lavonia Drafts, MD 11/09/18 [phone]

## 2018-11-08 NOTE — ED Notes (Signed)
See triage note  Presents with pain to lower back  States pain started about 2 weeks ago.Pain is when walking and moving.  If patient lays with legs up there is no pain.  Heat relieves pain.  aleeve also relieves the pain.  Pt reports does a lot of squats and push ups and that does not seem to aggravate pain, only when walking.  Does not hurt when bends.  Mild pain when sitting.  Pain is to left lower back and radiates down left lower leg when walking.

## 2018-11-08 NOTE — ED Triage Notes (Addendum)
Pt has had lower left back pain for last 2 weeks.  Pain is when walking and moving.  If patient lays with legs up there is no pain.  Heat relieves pain.  aleeve also relieves the pain.  Pt reports does a lot of squats and push ups and that does not seem to aggravate pain, only when walking.  Does not hurt when bends.  Mild pain when sitting.  Pain is to left lower back and radiates down left lower leg when walking.  Pain is not to flank area.  Pain does have to spurs on lower vertebrae.  Denies urinary sx. No fevers.

## 2018-11-11 ENCOUNTER — Ambulatory Visit: Payer: Self-pay | Admitting: Family Medicine

## 2018-12-06 DIAGNOSIS — H02056 Trichiasis without entropian left eye, unspecified eyelid: Secondary | ICD-10-CM | POA: Diagnosis not present

## 2018-12-06 DIAGNOSIS — H401133 Primary open-angle glaucoma, bilateral, severe stage: Secondary | ICD-10-CM | POA: Diagnosis not present

## 2019-03-07 ENCOUNTER — Encounter: Payer: Self-pay | Admitting: Podiatry

## 2019-03-07 ENCOUNTER — Other Ambulatory Visit: Payer: Self-pay

## 2019-03-07 ENCOUNTER — Ambulatory Visit (INDEPENDENT_AMBULATORY_CARE_PROVIDER_SITE_OTHER): Payer: Medicare Other | Admitting: Podiatry

## 2019-03-07 VITALS — Temp 97.1°F | Resp 16

## 2019-03-07 DIAGNOSIS — B351 Tinea unguium: Secondary | ICD-10-CM | POA: Diagnosis not present

## 2019-03-07 DIAGNOSIS — M79676 Pain in unspecified toe(s): Secondary | ICD-10-CM

## 2019-03-07 DIAGNOSIS — L989 Disorder of the skin and subcutaneous tissue, unspecified: Secondary | ICD-10-CM | POA: Diagnosis not present

## 2019-03-07 DIAGNOSIS — M7751 Other enthesopathy of right foot: Secondary | ICD-10-CM | POA: Diagnosis not present

## 2019-03-10 NOTE — Progress Notes (Signed)
    Subjective: Patient is a 85 y.o. male presenting to the office today for follow up evaluation of painful callus lesion to the right sub-fifth MPJ. Walking increases the pain. He has not done anything at home for treatment.   Patient also complains of elongated, thickened nails that cause pain while ambulating in shoes. He is unable to trim his own nails. Patient presents today for further treatment and evaluation.  Past Medical History:  Diagnosis Date  . Glaucoma   . History of blood clots   . Malabsorption syndrome   . Stomach cancer ([place])     Objective:  Physical Exam General: Alert and oriented x3 in no acute distress  Dermatology: Hyperkeratotic lesion present on the right sub-fifth MPJ. Pain on palpation with a central nucleated core noted. Skin is warm, dry and supple bilateral lower extremities. Negative for open lesions or macerations. Nails are tender, long, thickened and dystrophic with subungual debris, consistent with onychomycosis, 1-5 bilateral. No signs of infection noted.  Vascular: Palpable pedal pulses bilaterally. No edema or erythema noted. Capillary refill within normal limits.  Neurological: Epicritic and protective threshold grossly intact bilaterally.   Musculoskeletal Exam: Pain on palpation at the keratotic lesion noted as well as to the right 5th MPJ. Range of motion within normal limits bilateral. Muscle strength 5/5 in all groups bilateral.  Assessment: 1. Onychodystrophic nails 1-5 bilateral with hyperkeratosis of nails.  2. Onychomycosis of nail due to dermatophyte bilateral 3. Porokeratosis noted to the right sib-fifth MPJ   Plan of Care:  1. Patient evaluated. 2. Excisional debridement of keratoic lesion using a chisel blade was performed without incident.  3. Dressed with light dressing. 4. Mechanical debridement of nails 1-5 bilaterally performed using a nail nipper. Filed with dremel without incident.  5. Injection of 0.5 mLs Celestone  Soluspan injected into the 5th MPJ of the right foot.  6. Recommended good shoe gear.  7. Patient is to return to clinic in 6 months.   Edrick Kins, DPM [facility]  Dr. Edrick Kins, [address]                                        [place], [place] [address]                Office [phone]  Fax [phone]

## 2019-03-18 ENCOUNTER — Other Ambulatory Visit: Payer: Self-pay

## 2019-03-18 ENCOUNTER — Encounter: Payer: Self-pay | Admitting: Family Medicine

## 2019-03-18 ENCOUNTER — Ambulatory Visit (INDEPENDENT_AMBULATORY_CARE_PROVIDER_SITE_OTHER): Payer: Medicare Other | Admitting: Family Medicine

## 2019-03-18 VITALS — BP 132/58 | HR 68 | Temp 97.8°F | Resp 16 | Ht 70.0 in | Wt 108.0 lb

## 2019-03-18 DIAGNOSIS — R296 Repeated falls: Secondary | ICD-10-CM | POA: Diagnosis not present

## 2019-03-18 DIAGNOSIS — Z85028 Personal history of other malignant neoplasm of stomach: Secondary | ICD-10-CM

## 2019-03-18 DIAGNOSIS — E43 Unspecified severe protein-calorie malnutrition: Secondary | ICD-10-CM | POA: Diagnosis not present

## 2019-03-18 DIAGNOSIS — R7309 Other abnormal glucose: Secondary | ICD-10-CM | POA: Diagnosis not present

## 2019-03-18 DIAGNOSIS — R1310 Dysphagia, unspecified: Secondary | ICD-10-CM | POA: Diagnosis not present

## 2019-03-18 DIAGNOSIS — R531 Weakness: Secondary | ICD-10-CM

## 2019-03-18 DIAGNOSIS — E46 Unspecified protein-calorie malnutrition: Secondary | ICD-10-CM | POA: Insufficient documentation

## 2019-03-18 DIAGNOSIS — R29818 Other symptoms and signs involving the nervous system: Secondary | ICD-10-CM | POA: Diagnosis not present

## 2019-03-18 NOTE — Patient Instructions (Addendum)
Thank you for coming to the office today.  Increase nutrition with Ensure protein supplement. Work on more soft mechanical diet eggs yogurt type foods, mac n cheese, avoid meats.  Referral to [facility] to do Swallow Study testing - they will call and schedule.  Referral to [facility] to send a nurse and a physical therapist to the house to help you a few times a week with evaluation and therapy. This is only short term. - you can ask them about home health aide that can come out more often to help with daily activities if needed  [employer] (resources, through [facility], Care Management, assist with home health / family caregivers, local community resources, PDF educational resource)  Requirement for Care Management - NOTE: In order to be eligible for Care Management, the individual must be 44 years of age or older, have impairments in two activities of daily living, have complicated medical or behavioral impairments, and prefer to be cared for at home.  ?[facility] [address] [place], [place] [address]  Phone: [phone] [email or web address]   Please schedule a Follow-up Appointment to: Return in about 6 weeks (around 04/29/2019) for 6 week follow-up swallowing, weakness, falls HH update.  If you have any other questions or concerns, please feel free to call the office or send a message through [facility]. You may also schedule an earlier appointment if necessary.  Additionally, you may be receiving a survey about your experience at our office within a few days to 1 week by e-mail or mail. We value your feedback.  Nobie Putnam, DO [place]

## 2019-03-18 NOTE — Progress Notes (Signed)
Subjective:    Patient ID: Brian Ferguson, male    DOB: 11/27/1928, 85 y.o.   MRN: [id number]  Brian Ferguson is a 85 y.o. male presenting on 03/18/2019 for Establish Care (weight, choking on pasta yesterday as per daughter, fall, head pressure past weekend)  Transferred care to here, previous PCP Dr Otilio Miu at [facility].  Lives at home with son, Darly. His oldest daughter, [name] is primary caregiver assistance, lives nearby.  HPI   Dysphagia / Severe Protein Calorie Malnutrition / Weight Loss / Recurrent Falls Reports recently worsening difficulty with chewing and swallowing, has not had this tested before, they are asking for a swallow study. Eats 3 times a day, reduced portions, soft mechanical diet, yogurts, eggs, ice cream. He was having difficulty with some foods caused choking and now he tries to avoid those. He has original teeth, no dentures. Problem he endorses is swallowing not chewing as much. - He has had weight loss and poor nutrition, some reduced appetite at times a swell - Using some weights home weight exercise, not doing as much cardio - Recurrent falls with issue of weakness and balance, not using cane or walker but has devices at home. Admits some minor skin tears with bleeding, from some recent fall and lifting   PMH - Osteoarthritis, lumbar spine, chronic history of low back pain PMH - BPH not on medication  History Blood Clots, Right Lower Leg >10 years ago, diagnosed with multiple blood clots triggered by stomach cancer we believe. No other provoking injury. Resolved no further issue since  History of Stomach Cancer / Malabsoprtion - Age 35 he was diagnosed with stomach cancer, he was treated with surgical intervention, and resulting in malabsorption.  Glaucoma / reduced Vision Established w/ Eye Doctor, drops for glaucoma.  Also Podiatry for foot pain, see note.    Depression screen PHQ 2/9 03/18/2019  Decreased Interest 0  Down, Depressed, Hopeless  0  PHQ - 2 Score 0    Past Medical History:  Diagnosis Date  . Glaucoma   . History of blood clots   . Malabsorption syndrome    Past Surgical History:  Procedure Laterality Date  . HERNIA REPAIR    . STOMACH SURGERY     Social History   Socioeconomic History  . Marital status: Widowed    Spouse name: Not on file  . Number of children: Not on file  . Years of education: Not on file  . Highest education level: Not on file  Occupational History  . Not on file  Social Needs  . Financial resource strain: Not on file  . Food insecurity:    Worry: Not on file    Inability: Not on file  . Transportation needs:    Medical: Not on file    Non-medical: Not on file  Tobacco Use  . Smoking status: Former Research scientist (life sciences)  . Smokeless tobacco: Former Network engineer and Sexual Activity  . Alcohol use: Yes  . Drug use: No  . Sexual activity: Not on file  Lifestyle  . Physical activity:    Days per week: Not on file    Minutes per session: Not on file  . Stress: Not on file  Relationships  . Social connections:    Talks on phone: Not on file    Gets together: Not on file    Attends religious service: Not on file    Active member of club or organization: Not on file    Attends  meetings of clubs or organizations: Not on file    Relationship status: Not on file  . Intimate partner violence:    Fear of current or ex partner: Not on file    Emotionally abused: Not on file    Physically abused: Not on file    Forced sexual activity: Not on file  Other Topics Concern  . Not on file  Social History Narrative  . Not on file   History reviewed. No pertinent family history. No current outpatient medications on file prior to visit.   No current facility-administered medications on file prior to visit.     Review of Systems Per HPI unless specifically indicated above     Objective:    BP (!) 132/58   Pulse 68   Temp 97.8 F (36.6 C) (Oral)   Resp 16   Ht 5\' 10"  (1.778 m)    Wt 108 lb (49 kg)   BMI 15.50 kg/m   Wt Readings from Last 3 Encounters:  03/18/19 108 lb (49 kg)  11/08/18 147 lb (66.7 kg)  05/11/18 145 lb (65.8 kg)    Physical Exam Vitals signs and nursing note reviewed.  Constitutional:      General: He is not in acute distress.    Appearance: He is well-developed. He is not diaphoretic.     Comments: Chronically ill and elderly appearing 85 year old male, comfortable, cooperative, thin  HENT:     Head: Normocephalic and atraumatic.  Eyes:     General:        Right eye: No discharge.        Left eye: No discharge.     Conjunctiva/sclera: Conjunctivae normal.  Neck:     Musculoskeletal: Normal range of motion and neck supple.     Thyroid: No thyromegaly.  Cardiovascular:     Rate and Rhythm: Normal rate and regular rhythm.     Heart sounds: Normal heart sounds. No murmur.  Pulmonary:     Effort: Pulmonary effort is normal. No respiratory distress.     Breath sounds: Normal breath sounds. No wheezing or rales.  Musculoskeletal:     Right lower leg: No edema.     Left lower leg: No edema.     Comments: Significant muscle atrophy generalized, upper and lower extremity, facial and temples, and trunk.  Lymphadenopathy:     Cervical: No cervical adenopathy.  Skin:    General: Skin is warm and dry.     Findings: No erythema or rash.     Comments: Chronic dry skin statis dermatitis discoloration bilateral lower extremity, no open ulceration  Neurological:     Mental Status: He is alert and oriented to person, place, and time.  Psychiatric:        Behavior: Behavior normal.     Comments: Well groomed, good eye contact, normal speech and thoughts    Results for orders placed or performed during the hospital encounter of 06/20/17  Urinalysis, Complete w Microscopic  Result Value Ref Range   Color, Urine YELLOW YELLOW   APPearance CLEAR CLEAR   Specific Gravity, Urine 1.020 1.005 - 1.030   pH 7.5 5.0 - 8.0   Glucose, UA NEGATIVE  NEGATIVE mg/dL   Hgb urine dipstick NEGATIVE NEGATIVE   Bilirubin Urine NEGATIVE NEGATIVE   Ketones, ur NEGATIVE NEGATIVE mg/dL   Protein, ur NEGATIVE NEGATIVE mg/dL   Nitrite NEGATIVE NEGATIVE   Leukocytes, UA NEGATIVE NEGATIVE   Squamous Epithelial / LPF NONE SEEN NONE SEEN  WBC, UA 0-5 0 - 5 WBC/hpf   RBC / HPF NONE SEEN 0 - 5 RBC/hpf   Bacteria, UA NONE SEEN NONE SEEN      Assessment & Plan:   Problem List Items Addressed This Visit    Generalized weakness   Relevant Orders   COMPLETE METABOLIC PANEL WITH GFR   CBC with Differential/Platelet   TSH   Ambulatory referral to Home Health   History of cancer of stomach   Recurrent falls - Primary   Relevant Orders   Ambulatory referral to Home Health   Severe protein-calorie malnutrition ([place])   Relevant Orders   COMPLETE METABOLIC PANEL WITH GFR   CBC with Differential/Platelet   TSH   Ambulatory referral to Home Health    Other Visit Diagnoses    Dysphagia, unspecified type       Relevant Orders   DG SWALLOW FUNC OP [place]   Ambulatory referral to [place]   Difficulty balancing       Relevant Orders   Ambulatory referral to [place]   Abnormal glucose       Relevant Orders   Hemoglobin A1c      Clinically consistent with constellation of symptoms affecting his function, primarily recurrent falls and associated factors with generalized weakness, off balance - Significant malnutrition, trial ensure supplement, mechanical soft diet - Also has underlying dysphagia, uncertain etiology, at risk for aspiration - Encourage use of walker to avoid falls  Referral to [facility] PT RN home aide if possible, see below  Contact info given for [employer] for home aide in future  Ordered SLP swallow study - anticipating schedule soon, advise given mechanical soft diet at this time.   No orders of the defined types were placed in this encounter.  Orders Placed This Encounter  Procedures  . DG  SWALLOW FUNC OP MEDICARE SPEECH PATH    Standing Status:   Future    Standing Expiration Date:   05/17/2020    Order Specific Question:   Reason for Exam (SYMPTOM  OR DIAGNOSIS REQUIRED)    Answer:   Worsening dysphagia difficulty with chewing and swallowing, more choking, poor nutrition 85 year old male    Order Specific Question:   Where should this test be performed?    Answer:   Other  . COMPLETE METABOLIC PANEL WITH GFR  . CBC with Differential/Platelet  . TSH  . Hemoglobin A1c  . Ambulatory referral to Home Health    Referral Priority:   Routine    Referral Type:   Home Health Care    Referral Reason:   Specialty Services Required    Requested Specialty:   [place]    Number of Visits Requested:   1     Follow up plan: Return in about 6 weeks (around 04/29/2019) for 6 week follow-up swallowing, weakness, falls HH update.  Nobie Putnam, DO [facility] 03/18/2019, 10:24 AM

## 2019-03-19 LAB — COMPLETE METABOLIC PANEL WITH GFR
AG Ratio: 1.5 (calc) (ref 1.0–2.5)
ALT: 10 U/L (ref 9–46)
AST: 16 U/L (ref 10–35)
Albumin: 4.1 g/dL (ref 3.6–5.1)
Alkaline phosphatase (APISO): 54 U/L (ref 35–144)
BUN/Creatinine Ratio: 37 (calc) — ABNORMAL HIGH (ref 6–22)
BUN: 31 mg/dL — ABNORMAL HIGH (ref 7–25)
CO2: 29 mmol/L (ref 20–32)
Calcium: 9.5 mg/dL (ref 8.6–10.3)
Chloride: 105 mmol/L (ref 98–110)
Creat: 0.83 mg/dL (ref 0.70–1.11)
GFR, Est African American: 91 mL/min/{1.73_m2} (ref >=60)
GFR, Est Non African American: 79 mL/min/{1.73_m2} (ref >=60)
Globulin: 2.7 g/dL (calc) (ref 1.9–3.7)
Glucose, Bld: 100 mg/dL (ref 65–139)
Potassium: 4.6 mmol/L (ref 3.5–5.3)
Sodium: 142 mmol/L (ref 135–146)
Total Bilirubin: 0.8 mg/dL (ref 0.2–1.2)
Total Protein: 6.8 g/dL (ref 6.1–8.1)

## 2019-03-19 LAB — CBC WITH DIFFERENTIAL/PLATELET
Absolute Monocytes: 893 cells/uL (ref 200–950)
Basophils Absolute: 38 cells/uL (ref 0–200)
Basophils Relative: 0.4 %
Eosinophils Absolute: 47 cells/uL (ref 15–500)
Eosinophils Relative: 0.5 %
HCT: 40.4 % (ref 38.5–50.0)
Hemoglobin: 12.7 g/dL — ABNORMAL LOW (ref 13.2–17.1)
Lymphs Abs: 1513 cells/uL (ref 850–3900)
MCH: 29.1 pg (ref 27.0–33.0)
MCHC: 31.4 g/dL — ABNORMAL LOW (ref 32.0–36.0)
MCV: 92.7 fL (ref 80.0–100.0)
MPV: 11.1 fL (ref 7.5–12.5)
Monocytes Relative: 9.5 %
Neutro Abs: 6909 cells/uL (ref 1500–7800)
Neutrophils Relative %: 73.5 %
Platelets: 200 10*3/uL (ref 140–400)
RBC: 4.36 10*6/uL (ref 4.20–5.80)
RDW: 12.4 % (ref 11.0–15.0)
Total Lymphocyte: 16.1 %
WBC: 9.4 10*3/uL (ref 3.8–10.8)

## 2019-03-19 LAB — TSH: TSH: 2.27 mIU/L (ref 0.40–4.50)

## 2019-03-19 LAB — HEMOGLOBIN A1C
Hgb A1c MFr Bld: 6 % of total Hgb — ABNORMAL HIGH (ref <5.7)
Mean Plasma Glucose: 126 (calc)
eAG (mmol/L): 7 (calc)

## 2019-03-20 DIAGNOSIS — Z85028 Personal history of other malignant neoplasm of stomach: Secondary | ICD-10-CM | POA: Diagnosis not present

## 2019-03-20 DIAGNOSIS — M47816 Spondylosis without myelopathy or radiculopathy, lumbar region: Secondary | ICD-10-CM | POA: Diagnosis not present

## 2019-03-20 DIAGNOSIS — K909 Intestinal malabsorption, unspecified: Secondary | ICD-10-CM | POA: Diagnosis not present

## 2019-03-20 DIAGNOSIS — E43 Unspecified severe protein-calorie malnutrition: Secondary | ICD-10-CM | POA: Diagnosis not present

## 2019-03-20 DIAGNOSIS — Z87891 Personal history of nicotine dependence: Secondary | ICD-10-CM | POA: Diagnosis not present

## 2019-03-20 DIAGNOSIS — G8929 Other chronic pain: Secondary | ICD-10-CM | POA: Diagnosis not present

## 2019-03-20 DIAGNOSIS — R1310 Dysphagia, unspecified: Secondary | ICD-10-CM | POA: Diagnosis not present

## 2019-03-20 DIAGNOSIS — H409 Unspecified glaucoma: Secondary | ICD-10-CM | POA: Diagnosis not present

## 2019-03-20 DIAGNOSIS — M6281 Muscle weakness (generalized): Secondary | ICD-10-CM | POA: Diagnosis not present

## 2019-03-20 DIAGNOSIS — Z9181 History of falling: Secondary | ICD-10-CM | POA: Diagnosis not present

## 2019-03-21 DIAGNOSIS — M47816 Spondylosis without myelopathy or radiculopathy, lumbar region: Secondary | ICD-10-CM | POA: Diagnosis not present

## 2019-03-21 DIAGNOSIS — K909 Intestinal malabsorption, unspecified: Secondary | ICD-10-CM | POA: Diagnosis not present

## 2019-03-21 DIAGNOSIS — E43 Unspecified severe protein-calorie malnutrition: Secondary | ICD-10-CM | POA: Diagnosis not present

## 2019-03-21 DIAGNOSIS — M6281 Muscle weakness (generalized): Secondary | ICD-10-CM | POA: Diagnosis not present

## 2019-03-21 DIAGNOSIS — R1310 Dysphagia, unspecified: Secondary | ICD-10-CM | POA: Diagnosis not present

## 2019-03-21 DIAGNOSIS — G8929 Other chronic pain: Secondary | ICD-10-CM | POA: Diagnosis not present

## 2019-03-25 DIAGNOSIS — G8929 Other chronic pain: Secondary | ICD-10-CM | POA: Diagnosis not present

## 2019-03-25 DIAGNOSIS — R1310 Dysphagia, unspecified: Secondary | ICD-10-CM | POA: Diagnosis not present

## 2019-03-25 DIAGNOSIS — E43 Unspecified severe protein-calorie malnutrition: Secondary | ICD-10-CM | POA: Diagnosis not present

## 2019-03-25 DIAGNOSIS — K909 Intestinal malabsorption, unspecified: Secondary | ICD-10-CM | POA: Diagnosis not present

## 2019-03-25 DIAGNOSIS — M6281 Muscle weakness (generalized): Secondary | ICD-10-CM | POA: Diagnosis not present

## 2019-03-25 DIAGNOSIS — M47816 Spondylosis without myelopathy or radiculopathy, lumbar region: Secondary | ICD-10-CM | POA: Diagnosis not present

## 2019-03-27 DIAGNOSIS — M6281 Muscle weakness (generalized): Secondary | ICD-10-CM | POA: Diagnosis not present

## 2019-03-27 DIAGNOSIS — E43 Unspecified severe protein-calorie malnutrition: Secondary | ICD-10-CM | POA: Diagnosis not present

## 2019-03-27 DIAGNOSIS — K909 Intestinal malabsorption, unspecified: Secondary | ICD-10-CM | POA: Diagnosis not present

## 2019-03-27 DIAGNOSIS — M47816 Spondylosis without myelopathy or radiculopathy, lumbar region: Secondary | ICD-10-CM | POA: Diagnosis not present

## 2019-03-27 DIAGNOSIS — G8929 Other chronic pain: Secondary | ICD-10-CM | POA: Diagnosis not present

## 2019-03-27 DIAGNOSIS — R1310 Dysphagia, unspecified: Secondary | ICD-10-CM | POA: Diagnosis not present

## 2019-03-31 DIAGNOSIS — K909 Intestinal malabsorption, unspecified: Secondary | ICD-10-CM | POA: Diagnosis not present

## 2019-03-31 DIAGNOSIS — E43 Unspecified severe protein-calorie malnutrition: Secondary | ICD-10-CM | POA: Diagnosis not present

## 2019-03-31 DIAGNOSIS — M6281 Muscle weakness (generalized): Secondary | ICD-10-CM | POA: Diagnosis not present

## 2019-03-31 DIAGNOSIS — G8929 Other chronic pain: Secondary | ICD-10-CM | POA: Diagnosis not present

## 2019-03-31 DIAGNOSIS — R1310 Dysphagia, unspecified: Secondary | ICD-10-CM | POA: Diagnosis not present

## 2019-03-31 DIAGNOSIS — M47816 Spondylosis without myelopathy or radiculopathy, lumbar region: Secondary | ICD-10-CM | POA: Diagnosis not present

## 2019-04-02 DIAGNOSIS — R1310 Dysphagia, unspecified: Secondary | ICD-10-CM | POA: Diagnosis not present

## 2019-04-02 DIAGNOSIS — E43 Unspecified severe protein-calorie malnutrition: Secondary | ICD-10-CM | POA: Diagnosis not present

## 2019-04-02 DIAGNOSIS — G8929 Other chronic pain: Secondary | ICD-10-CM | POA: Diagnosis not present

## 2019-04-02 DIAGNOSIS — M47816 Spondylosis without myelopathy or radiculopathy, lumbar region: Secondary | ICD-10-CM | POA: Diagnosis not present

## 2019-04-02 DIAGNOSIS — K909 Intestinal malabsorption, unspecified: Secondary | ICD-10-CM | POA: Diagnosis not present

## 2019-04-02 DIAGNOSIS — M6281 Muscle weakness (generalized): Secondary | ICD-10-CM | POA: Diagnosis not present

## 2019-04-07 DIAGNOSIS — H401133 Primary open-angle glaucoma, bilateral, severe stage: Secondary | ICD-10-CM | POA: Diagnosis not present

## 2019-04-08 DIAGNOSIS — R1310 Dysphagia, unspecified: Secondary | ICD-10-CM | POA: Diagnosis not present

## 2019-04-08 DIAGNOSIS — G8929 Other chronic pain: Secondary | ICD-10-CM | POA: Diagnosis not present

## 2019-04-08 DIAGNOSIS — K909 Intestinal malabsorption, unspecified: Secondary | ICD-10-CM | POA: Diagnosis not present

## 2019-04-08 DIAGNOSIS — M6281 Muscle weakness (generalized): Secondary | ICD-10-CM | POA: Diagnosis not present

## 2019-04-08 DIAGNOSIS — M47816 Spondylosis without myelopathy or radiculopathy, lumbar region: Secondary | ICD-10-CM | POA: Diagnosis not present

## 2019-04-08 DIAGNOSIS — E43 Unspecified severe protein-calorie malnutrition: Secondary | ICD-10-CM | POA: Diagnosis not present

## 2019-04-10 DIAGNOSIS — R1310 Dysphagia, unspecified: Secondary | ICD-10-CM | POA: Diagnosis not present

## 2019-04-10 DIAGNOSIS — G8929 Other chronic pain: Secondary | ICD-10-CM | POA: Diagnosis not present

## 2019-04-10 DIAGNOSIS — M47816 Spondylosis without myelopathy or radiculopathy, lumbar region: Secondary | ICD-10-CM | POA: Diagnosis not present

## 2019-04-10 DIAGNOSIS — E43 Unspecified severe protein-calorie malnutrition: Secondary | ICD-10-CM | POA: Diagnosis not present

## 2019-04-10 DIAGNOSIS — M6281 Muscle weakness (generalized): Secondary | ICD-10-CM | POA: Diagnosis not present

## 2019-04-10 DIAGNOSIS — K909 Intestinal malabsorption, unspecified: Secondary | ICD-10-CM | POA: Diagnosis not present

## 2019-04-25 ENCOUNTER — Ambulatory Visit: Payer: Medicare Other | Admitting: Podiatry

## 2019-04-29 ENCOUNTER — Ambulatory Visit (INDEPENDENT_AMBULATORY_CARE_PROVIDER_SITE_OTHER): Payer: Medicare Other | Admitting: Family Medicine

## 2019-04-29 ENCOUNTER — Other Ambulatory Visit: Payer: Self-pay

## 2019-04-29 ENCOUNTER — Encounter: Payer: Self-pay | Admitting: Family Medicine

## 2019-04-29 VITALS — BP 115/67 | HR 72 | Temp 97.6°F | Resp 16 | Ht 70.0 in | Wt 105.0 lb

## 2019-04-29 DIAGNOSIS — E43 Unspecified severe protein-calorie malnutrition: Secondary | ICD-10-CM | POA: Diagnosis not present

## 2019-04-29 DIAGNOSIS — Z85028 Personal history of other malignant neoplasm of stomach: Secondary | ICD-10-CM

## 2019-04-29 DIAGNOSIS — R634 Abnormal weight loss: Secondary | ICD-10-CM | POA: Diagnosis not present

## 2019-04-29 DIAGNOSIS — R1310 Dysphagia, unspecified: Secondary | ICD-10-CM | POA: Diagnosis not present

## 2019-04-29 NOTE — Progress Notes (Addendum)
Subjective:    Patient ID: Brian Ferguson, male    DOB: 10-17-1928, 85 y.o.   MRN: [id number]  Brian Ferguson is a 85 y.o. male presenting on 04/29/2019 for Weight Loss   HPI   Lives at home with son, Arvine. His oldest daughter, [name] is primary caregiver assistance who provides history today  Follow-up Dysphagia / Severe Protein Calorie Malnutrition / Weight Loss / Recurrent Falls - Last visit with me 03/18/19, for initial visit for same problems, treated with basic blood panel ordered and referral to [place] and SLP Swallow Study, see prior notes for background information. - Interval update with labs were shared with patient. [place] provided assistance and they may have completed their therapy as of now, and SLP Swallow Study never scheduled due to issue with delays on scheduling those tests. Also SLP advised them that they needed the results to help further.  - Today patient / caregiver report still worsening problems. His appetite is mostly improved but still issue is difficulty with choking and swallowing. He has lost 3 lbs weight in past 6 weeks since last visit. Despite trying to keep up with Ensure supplement and mac n cheese food and trying to improve PO intake but still very limited due to choking. Has tried soft mechanical diet. Has worked with SLP before. - He can chew but problem is primarily swallowing. He has original teeth, no dentures - Using some weights home weight exercise, not doing as much cardio - Recurrent falls with issue of weakness and balance, has used wheelchair mostly, w/ home health tried walker but needs a new one, request order today - Also he needs a bedside urinal to avoid walking to bathroom [facility] PT, forward posture, slow gait, using walker - would benefit from rolling walker   PMH - Osteoarthritis, lumbar spine, chronic history of low back pain  History of Stomach Cancer / Malabsoprtion - Age 66 he was diagnosed with  stomach cancer, he was treated with surgical intervention, and resulting in malabsorption. He says this was resolved in past. But now they are worried malabsorption symptoms can be causing some of his weight loss, he is interested in evaluation by a GI specialist now due to further weight loss   Additional questions about Podiatry / Foot Pain - he was seen recently given cortisone injection, limited relief, plans to return and ask about x-ray   Depression screen [facility] 2/9 04/29/2019 03/18/2019  Decreased Interest 0 0  Down, Depressed, Hopeless 3 0  PHQ - 2 Score 3 0  Altered sleeping 0 -  Tired, decreased energy 0 -  Change in appetite 0 -  Feeling bad or failure about yourself  0 -  Trouble concentrating 1 -  Moving slowly or fidgety/restless 0 -  Suicidal thoughts 0 -  PHQ-9 Score 4 -  Difficult doing work/chores Somewhat difficult -    Social History   Tobacco Use  . Smoking status: Former Research scientist (life sciences)  . Smokeless tobacco: Former Network engineer Use Topics  . Alcohol use: Yes  . Drug use: No    Review of Systems Per HPI unless specifically indicated above     Objective:    BP 115/67   Pulse 72   Temp 97.6 F (36.4 C) (Oral)   Resp 16   Ht _0  (1.778 m)   Wt 105 lb (47.6 kg)   BMI 15.07 kg/m   Wt Readings from Last 3 Encounters:  04/29/19 105 lb (47.6  kg)  03/18/19 108 lb (49 kg)  11/08/18 147 lb (66.7 kg)    Physical Exam Vitals signs and nursing note reviewed.  Constitutional:      General: He is not in acute distress.    Appearance: He is well-developed. He is not diaphoretic.     Comments: Chronically ill and elderly appearing 85 year old male, comfortable, cooperative, thin. In wheelchair.  HENT:     Head: Normocephalic and atraumatic.  Eyes:     General:        Right eye: No discharge.        Left eye: No discharge.     Conjunctiva/sclera: Conjunctivae normal.  Neck:     Musculoskeletal: Normal range of motion and neck supple.     Thyroid: No  thyromegaly.  Cardiovascular:     Rate and Rhythm: Normal rate and regular rhythm.     Heart sounds: Normal heart sounds. No murmur.  Pulmonary:     Effort: Pulmonary effort is normal. No respiratory distress.     Breath sounds: Normal breath sounds. No wheezing or rales.  Musculoskeletal:     Right lower leg: No edema.     Left lower leg: No edema.     Comments: Significant muscle atrophy generalized, upper and lower extremity, facial and temples, and trunk.  Lymphadenopathy:     Cervical: No cervical adenopathy.  Skin:    General: Skin is warm and dry.     Findings: No erythema or rash.     Comments: Chronic dry skin statis dermatitis discoloration bilateral lower extremity, no open ulceration  Neurological:     Mental Status: He is alert and oriented to person, place, and time.  Psychiatric:        Behavior: Behavior normal.     Comments: Well groomed, good eye contact, normal speech and thoughts       Results for orders placed or performed in visit on 03/18/19  COMPLETE METABOLIC PANEL WITH GFR  Result Value Ref Range   Glucose, Bld 100 65 - 139 mg/dL   BUN 31 (H) 7 - 25 mg/dL   Creat 0.83 0.70 - 1.11 mg/dL   GFR, Est Non African American 79 > OR = 60 mL/min/1.10m   GFR, Est African American 91 > OR = 60 mL/min/1.716m  BUN/Creatinine Ratio 37 (H) 6 - 22 (calc)   Sodium 142 135 - 146 mmol/L   Potassium 4.6 3.5 - 5.3 mmol/L   Chloride 105 98 - 110 mmol/L   CO2 29 20 - 32 mmol/L   Calcium 9.5 8.6 - 10.3 mg/dL   Total Protein 6.8 6.1 - 8.1 g/dL   Albumin 4.1 3.6 - 5.1 g/dL   Globulin 2.7 1.9 - 3.7 g/dL (calc)   AG Ratio 1.5 1.0 - 2.5 (calc)   Total Bilirubin 0.8 0.2 - 1.2 mg/dL   Alkaline phosphatase (APISO) 54 35 - 144 U/L   AST 16 10 - 35 U/L   ALT 10 9 - 46 U/L  CBC with Differential/Platelet  Result Value Ref Range   WBC 9.4 3.8 - 10.8 Thousand/uL   RBC 4.36 4.20 - 5.80 Million/uL   Hemoglobin 12.7 (L) 13.2 - 17.1 g/dL   HCT 40.4 38.5 - 50.0 %   MCV 92.7  80.0 - 100.0 fL   MCH 29.1 27.0 - 33.0 pg   MCHC 31.4 (L) 32.0 - 36.0 g/dL   RDW 12.4 11.0 - 15.0 %   Platelets 200 140 - 400 Thousand/uL  MPV 11.1 7.5 - 12.5 fL   Neutro Abs 6,909 1,500 - 7,800 cells/uL   Lymphs Abs 1,513 850 - 3,900 cells/uL   Absolute Monocytes 893 200 - 950 cells/uL   Eosinophils Absolute 47 15 - 500 cells/uL   Basophils Absolute 38 0 - 200 cells/uL   Neutrophils Relative % 73.5 %   Total Lymphocyte 16.1 %   Monocytes Relative 9.5 %   Eosinophils Relative 0.5 %   Basophils Relative 0.4 %  TSH  Result Value Ref Range   TSH 2.27 0.40 - 4.50 mIU/L  Hemoglobin A1c  Result Value Ref Range   Hgb A1c MFr Bld 6.0 (H) <5.7 % of total Hgb   Mean Plasma Glucose 126 (calc)   eAG (mmol/L) 7.0 (calc)      Assessment & Plan:   Problem List Items Addressed This Visit    History of cancer of stomach   Relevant Orders   Ambulatory referral to Gastroenterology   Severe protein-calorie malnutrition ([place]) - Primary   Relevant Orders   Ambulatory referral to Gastroenterology   DG SWALLOW FUNC OP MEDICARE SPEECH PATH    Other Visit Diagnoses    Weight loss, unintentional       Relevant Orders   Ambulatory referral to Gastroenterology   DG SWALLOW FUNC OP MEDICARE SPEECH PATH   Dysphagia, unspecified type       Relevant Orders   Ambulatory referral to Gastroenterology   DG SWALLOW FUNC OP MEDICARE SPEECH PATH      Gradual worsening and functional decline in frail 85 year old patient with worsening weight loss and difficulty with PO due to dysphagia, with severe protein calorie malnutrition - Recurrent falls and generalized weakness can be related to poor nutrition and elderly age - Has completed Home Health program with [facility] already - Awaiting on SLP Swallow Study scheduled, had issue with scheduling this test last ordered at last visit, now today to investigate this  Plan - Referral to [facility] for further evaluation and management, now concern with history  of gastric cancer and known dysphagia / malabsorption may be affecting his weight loss further, requesting further input and treatment options - Re order SLP Swallow Study STAT now to request sooner scheduling given continued weight loss very limited PO intake now, hospital was contacted by staff and work on scheduling this, as opposed to waiting until late August for next apt since problem with referral initially - Hand written orders for DME for Rolling Walker without seat and Bedside Urinal with Lid given to patient today they can take to med supply store, notify if need - Follow-up as needed within 4-6 weeks  Orders Placed This Encounter  Procedures  . DG SWALLOW FUNC OP MEDICARE SPEECH PATH    Standing Status:   Future    Standing Expiration Date:   06/29/2020    Order Specific Question:   Reason for Exam (SYMPTOM  OR DIAGNOSIS REQUIRED)    Answer:   Significant weight loss - Worsening dysphagia difficulty with chewing and swallowing, more choking, poor nutrition 85 year old male    Order Specific Question:   Where should this test be performed?    Answer:   [facility])  . Ambulatory referral to Gastroenterology    Referral Priority:   Routine    Referral Type:   Consultation    Referral Reason:   Specialty Services Required    Number of Visits Requested:   1     No orders  of the defined types were placed in this encounter.   Follow up plan: Return in about 6 weeks (around 06/10/2019) for Weight Loss / Malabsorption.  Nobie Putnam, [facility] 04/29/2019, 11:26 AM

## 2019-04-29 NOTE — Patient Instructions (Addendum)
Thank you for coming to the office today.  Referral to GI specialist for malabsorption, dysphagia or choking,  [facility]) [address], [place] [address] Phone: [phone]  We have re ordered the swallow study, they should be scheduling this soon now, we have made it more urgent.  [address], [place] [address] [address] [place] Phone: [phone]  Please schedule a Follow-up Appointment to: Return in about 6 weeks (around 06/10/2019) for Weight Loss / Malabsorption.  If you have any other questions or concerns, please feel free to call the office or send a message through [place]. You may also schedule an earlier appointment if necessary.  Additionally, you may be receiving a survey about your experience at our office within a few days to 1 week by e-mail or mail. We value your feedback.  Nobie Putnam, DO [place]

## 2019-05-01 ENCOUNTER — Other Ambulatory Visit: Payer: Self-pay

## 2019-05-01 ENCOUNTER — Ambulatory Visit
Admission: RE | Admit: 2019-05-01 | Discharge: 2019-05-01 | Disposition: A | Discharge status: Home / Self Care | Payer: Medicare Other | Source: Ambulatory Visit | Attending: Family Medicine | Admitting: Family Medicine

## 2019-05-01 DIAGNOSIS — E43 Unspecified severe protein-calorie malnutrition: Secondary | ICD-10-CM

## 2019-05-01 DIAGNOSIS — R634 Abnormal weight loss: Secondary | ICD-10-CM | POA: Insufficient documentation

## 2019-05-01 DIAGNOSIS — R1310 Dysphagia, unspecified: Secondary | ICD-10-CM | POA: Diagnosis not present

## 2019-05-01 DIAGNOSIS — R1312 Dysphagia, oropharyngeal phase: Secondary | ICD-10-CM | POA: Insufficient documentation

## 2019-05-01 NOTE — Therapy (Signed)
[place], [place], [address] Phone: [phone]   Fax:     Modified Barium Swallow  Patient Details  Name: Brian Ferguson MRN: [id number] Date of Birth: [birth year] No data recorded  Encounter Date: 05/01/2019  End of Session - 05/01/19 1445    Visit Number  1    Number of Visits  1    Date for SLP Re-Evaluation  05/01/19    SLP Start Time  76    SLP Stop Time   2111    SLP Time Calculation (min)  60 min       Past Medical History:  Diagnosis Date  . Glaucoma   . History of blood clots   . Malabsorption syndrome     Past Surgical History:  Procedure Laterality Date  . HERNIA REPAIR    . STOMACH SURGERY      There were no vitals filed for this visit.   Subjective: Patient behavior: (alertness, ability to follow instructions, etc.):  The patient is very frail and thin.  He is cooperative with the study and demonstrated the capacity to understand results and recommendations.  Chief complaint: Significant weight loss- Worsening dysphagia eifficulty with chewing and swallowing, more choking, poor nutrition    Objective:  Radiological Procedure: A videoflouroscopic evaluation of oral-preparatory, reflex initiation, and pharyngeal phases of the swallow was performed; as well as a screening of the upper esophageal phase.  I. POSTURE: Upright in MBS chair  II. VIEW: Lateral  III. COMPENSATORY STRATEGIES: N/A  IV. BOLUSES ADMINISTERED:   Thin Liquid: DNT   Nectar-thick Liquid: 1 cup rim   Honey-thick Liquid: DNT   Puree: 1 teaspoon presentation   Mechanical Soft: DNT  V. RESULTS OF EVALUATION: A. ORAL PREPARATORY PHASE: (The lips, tongue, and velum are observed for strength and coordination)       **Overall Severity Rating: Within functional limits  B. SWALLOW INITIATION/REFLEX: (The reflex is normal if "triggered" by the time the bolus reached the base of the  tongue)  **Overall Severity Rating: Severe; requires multiple attempts to initiate pharyngeal swallow  C. PHARYNGEAL PHASE: (Pharyngeal function is normal if the bolus shows rapid, smooth, and continuous transit through the pharynx and there is no pharyngeal residue after the swallow)  **Overall Severity Rating: Profound; Insufficient opening of the pharyngoesophageal segment, absent hyolaryngeal anterior movement, absent epiglottic inversion, minimal amount of food/liquid entering the esophagus with severe pharyngeal residue, aspiration of residue with nectar-thick liquid.  D. LARYNGEAL PENETRATION: (Material entering into the laryngeal inlet/vestibule but not aspirated) No  E. ASPIRATION: Greater than trace aspiration of pharyngeal residue with nectar-thick liquid  F. ESOPHAGEAL PHASE: (Screening of the upper esophagus) Insufficient opening of the pharyngoesophageal segment with a tight appearance in the upper esophagus with the small amount that enters the esophagus.  ASSESSMENT: This 85 year old man; with significant weight loss; is presenting with severe oropharyngeal dysphagia characterized by insufficient opening of the pharyngoesophageal segment, absent hyolaryngeal anterior movement, absent epiglottic inversion, minimal amount of food/liquid entering the esophagus with severe pharyngeal residue, aspiration of residue with nectar-thick liquid.  The patient is at severe risk for malnutrition secondary poor PES opening and severe risk for chronic aspiration of pharyngeal resdue.  The patient may benefit from referral to ENT to assess laryngeal function.  Given the weight loss and aspiration risk, recommend alternative feeding route such as a PEG.  In the meantime, he should maintain stingent oral care to reduce the risk of aspriation  of oral/pharyngeal bacteria.  PLAN/RECOMMENDATIONS:   A. Diet: Safest diet is NPO, in the mean time very soft/loose purees may be best    B. Swallowing  Precautions: Stringent oral care   C. Recommended consultation to: GI, ENT   D. Therapy recommendations: speech therapy is not indicated at this point- the patient does not have sufficient strength to participate in active dysphagia therapy.   E. Results and recommendations were discussed with the patient and his daughter immediately following the study and the final report routed to the referring provider.   Patient will benefit from skilled therapeutic intervention in order to improve the following deficits and impairments:   1. Oropharyngeal dysphagia   2. Severe protein-calorie malnutrition ([place])   3. Weight loss, unintentional           Problem List Patient Active Problem List   Diagnosis Date Noted  . History of cancer of stomach 03/18/2019  . Severe protein-calorie malnutrition ([place]) 03/18/2019  . Recurrent falls 03/18/2019  . Generalized weakness 03/18/2019   Brian Sea, MS/CCC- SLP  [name] 05/01/2019, 2:46 PM  [name] [place], [place], [address] Phone: [phone]   Fax:     Name: Brian Ferguson MRN: [id number] Date of Birth: Mar 13, 1929

## 2019-05-02 ENCOUNTER — Ambulatory Visit (INDEPENDENT_AMBULATORY_CARE_PROVIDER_SITE_OTHER): Payer: Medicare Other

## 2019-05-02 ENCOUNTER — Telehealth: Payer: Self-pay | Admitting: Family Medicine

## 2019-05-02 ENCOUNTER — Other Ambulatory Visit: Payer: Self-pay

## 2019-05-02 ENCOUNTER — Ambulatory Visit (INDEPENDENT_AMBULATORY_CARE_PROVIDER_SITE_OTHER): Payer: Medicare Other | Admitting: Podiatry

## 2019-05-02 DIAGNOSIS — M7751 Other enthesopathy of right foot: Secondary | ICD-10-CM | POA: Diagnosis not present

## 2019-05-02 DIAGNOSIS — G8929 Other chronic pain: Secondary | ICD-10-CM

## 2019-05-02 DIAGNOSIS — M778 Other enthesopathies, not elsewhere classified: Secondary | ICD-10-CM

## 2019-05-02 DIAGNOSIS — M779 Enthesopathy, unspecified: Secondary | ICD-10-CM

## 2019-05-02 DIAGNOSIS — M79671 Pain in right foot: Secondary | ICD-10-CM

## 2019-05-02 NOTE — Patient Instructions (Signed)
Recommend Biofreeze topical daily

## 2019-05-02 NOTE — Telephone Encounter (Signed)
Referral form completed to [facility] for in home Palliative Care assessment for goals of care discuss future indication of G-Tube / PEG tube due to worsening oropharyngeal dysphagia choking and high aspiration risk on recent swallow eval, with severe protein calorie malnutrition / weight loss, tolerating some foods such as mac n cheese this has been gradual declining problem but he is still able to take in PO at this time safely per daughter primary caregiver but now she has higher concern for him, discussion briefly today on phone with Brian Ferguson that we may need to pursue G-Tube per SLP recommendation, she says patient was hesitant on this initially and declined now he is open to this idea, I advised next step is consultation with Palliative to formally discuss his goals of care and wishes and make a plan - let us know if decision is for G-tube then proceed w/ surgery referral and already has GI specialist apt scheduled for August. She also asks about refer to [facility] for ALL specialist, and mentioned vascular too with podiatry saying reduced pulses. I advised that one potential option is to transfer his primary care to [facility] as well to consider coordinating with their specialist care as well, she will consider this.  In meantime, I have contacted [facility] and gave them case information, they will proceed with eval patient palliative next week, I am sending fax to them today for review with all requested info.  Also will place referral to CCM Nurse CM and Social Worker to help coordinate his care due to higher level of care needed now with clinical decline.  Brian Ferguson, [facility] 05/02/2019, 2:49 PM

## 2019-05-05 ENCOUNTER — Telehealth: Payer: Self-pay

## 2019-05-05 NOTE — Telephone Encounter (Signed)
Telephone call to patient's daughter [name] to schedule Palliative care assessment visit.  Daughter states patient has appointment to get his hair cut today at lunchtime. Daughter in agreement with palliative care SW and RM making home visit 05/05/19 at 9:00 AM.

## 2019-05-06 ENCOUNTER — Telehealth: Payer: Self-pay

## 2019-05-06 ENCOUNTER — Other Ambulatory Visit: Payer: Self-pay

## 2019-05-06 ENCOUNTER — Other Ambulatory Visit: Payer: Medicare Other

## 2019-05-06 DIAGNOSIS — Z515 Encounter for palliative care: Secondary | ICD-10-CM

## 2019-05-06 NOTE — Telephone Encounter (Signed)
Virgie informed she will let daughter know as well.

## 2019-05-06 NOTE — Telephone Encounter (Signed)
Brian Ferguson with Hospice called [phone].  Patient has GI consult 06/09/19 and patient is declining in weight and not eating well.  They are requesting for you to order an ultrasound since he can not be seen sooner.  Please advise

## 2019-05-06 NOTE — Progress Notes (Signed)
PATIENT NAME: Brian Ferguson DOB: 09-26-1929 MRN: [id number]  PRIMARY CARE PROVIDER: Olin Hauser, DO  RESPONSIBLE PARTY:  Acct ID - Guarantor Home Phone Work Phone Relationship Acct Type  [id number] Brian Ferguson [phone]  Self P/F     [address] [place], [place] [address]    PLAN OF CARE and INTERVENTIONS:               1.  GOALS OF CARE/ ADVANCE CARE PLANNING: Patient wants to know why he's losing weight and if he is a canditate for a G tube, Patient wants to remain in his home.                  2.  PATIENT/CAREGIVER EDUCATION:  Education on mechanical soft diet, education on thickened liquids, education on aspiration precautions, education on fall precautions, support.               3.  DISEASE STATUS: SW and RN made home visit to patient's home.  Palliative care team met with patient, daughter Brian Ferguson and son Brian Ferguson.  Son lives in the home with patient and has schizophrenia but is doing well in his meds.  Patient's past medial history: H/O stomach cancer, H/O blood clots, Severe protein calorie malnutrition, weakness, falls, Glaucoma and malabsorption syndrome. Patient has lost 40 pounds since 04/2018.  Patient had a swallowing study completed last week and ST recommended patient have a G tube placed.  Patient has a GI appointment 8/24 however patient is aspirating and only drinking small amounts.  Dr Parks Ranger recommended mechanical soft diet until patient can be seen by GI.  RN provided education to patient and family on foods and recommended Thick It in patient's liquids.  RN called GI department and MD can see patient 05/04/2019 at 11:00 AM.  RN feels patient is hospice appropriate however patient and family want diagnosis of what is causing patient's decline. Daughter concerned patient's cancer may have returned.  Patient reports MD removed 2/3 of his stomach and some of his intestine.  Education to patient he may not be a canditate for a G tube with removal of stomach and  malabsorption syndrome.  Patient states he would like G tube placed this week and would like to gain 100 pounds.  Patient denies pain at the present time but reports he has pain in his right foot when he walks.  Patient reports he has not been sleeping well at night due to anxiety about G tube.  Patient's breathsounds are diminished throughout.  Patient is unsteady on his feet and requires 1 person assist and use of walker when ambulating.  SW and RN made recommendation of bed assist rail and shower chair for patient.  Education provided to patient and family on fall precautions.  Patient wishes to remain a Full Code at the present time. Daughter reports patient has had marked difficulty swallowing the past 4-6 weeks.  Patient and family in agreement with palliative care services.  Patient and family in agreement with phone call from palliative care team member later this week. Patient and family encouraged to call with questions or concerns.                          HISTORY OF PRESENT ILLNESS:    CODE STATUS: Full Code  ADVANCED DIRECTIVES: N MOST FORM: No PPS: 40%   PHYSICAL EXAM:   VITALS: See vital signs  LUNGS: decreased breath sounds CARDIAC: Cor RRR  EXTREMITIES: Trace edema SKIN: Skin color, texture, turgor normal. No rashes or lesions or erythema - lower leg(s) bilateral and hyperpigmentation - lower leg(s) bilateral  NEURO: positive for headaches and weakness       Nilda Simmer, RN

## 2019-05-06 NOTE — Telephone Encounter (Signed)
Daughter called back and now patient has an appointment with Dr. Bonna Gains for his GI issues.  Please disregard last message.  Now Daughter is needing rx for wakler with seat to be sent to [facility].  They stated they are waiting on the form they faxed last week.  Ask for Helene Kelp or Corene Cornea at [facility] supply.  [phone] or [phone]

## 2019-05-06 NOTE — Telephone Encounter (Signed)
[facility] form for walker was completed yesterday after hours 05/05/19, it should have been in the to be faxed pile today for 05/06/19.  They can check back with [facility] later this week for any updates on that form after it is processed.  I see GI apt on 7/22 will stay tuned.  Nobie Putnam, [facility] 05/06/2019, 1:32 PM

## 2019-05-06 NOTE — Progress Notes (Signed)
COMMUNITY PALLIATIVE CARE SW NOTE  PATIENT NAME: Brian Ferguson DOB: [birth year] MRN: [id number]  PRIMARY CARE PROVIDER: Olin Hauser, DO  RESPONSIBLE PARTY:  Acct ID - Guarantor Home Phone Work Phone Relationship Acct Type  [id number] TAYSHON, WINKER [phone]  Self P/F     [address], [place],  [address]     PLAN OF CARE and INTERVENTIONS:             1. GOALS OF CARE/ ADVANCE CARE PLANNING:  Patient's goals are to remain at home, gain weight and feel better. Patient is a FULL CODE. Patient's HCPOA is [name].  2. SOCIAL/EMOTIONAL/SPIRITUAL ASSESSMENT/ INTERVENTIONS:  SW and RN met with patient, [name] (patient's daughter) and son. [name] (patient's daughter) was on speaker phone. [name] lives at [facility], and another daughter lives in [place]. Patient lives in his home with his adult son, [name] noted that patient's son has mental health diagnoses but is managed and doing well. Diane checks on patient daily. Patient also has a girlfriend, Rod Holler that visits. Patient and family provided brief history. Patient has history of stomach cancer and has had multiple surgeries. Patient started having difficulty with eating in March. Patient has lost 40 lbs since last July. Patient expressed desire for G-tube given his significant weight loss. Patient reports that he does cough with eating. Diane discussed food options. Patient uses a walker, and walks slowly. Patient does have pain in his foot and was told by podiatry to see vascular. Patient said he only has pain with walking. Patient is not on any medications. Patient is a widower, and is a retired Chief Financial Officer. Patient lived up [place] for most of his life but enjoys being in [place]. Patient talked about his old toy collections and reflected on his life during visit. 3. PATIENT/CAREGIVER EDUCATION/ COPING:  Team provided education on palliative care. Patient has many questions about diagnosis, prognosis and treatment options. Team  encouraged discussion with MD at visit tomorrow. SW provided emotional support, used active and reflective listening and encouraged ongoing goals of care conversation once they have more information.  4. PERSONAL EMERGENCY PLAN:  Patient/family will call 9-1-1 for emergencies. 5. COMMUNITY RESOURCES COORDINATION/ HEALTH CARE NAVIGATION:  RN contacted GI office during visit and was able to move patient's visit to tomorrow at Tony will take patient to appointment and helps to coordinate visits. Team provided education on DME, including shower bench and hospital bed -- patient and family want to discuss. Team discussed safety and fall precautions.  6. FINANCIAL/LEGAL CONCERNS/INTERVENTIONS:  None.     SOCIAL HX:  Social History   Tobacco Use  . Smoking status: Former Research scientist (life sciences)  . Smokeless tobacco: Former Network engineer Use Topics  . Alcohol use: Yes    CODE STATUS:   Code Status: Not on file  ADVANCED DIRECTIVES: N MOST FORM COMPLETE:  No. HOSPICE EDUCATION PROVIDED: Yes.  PPS: Patient is dependent of most ADLs. Patient is walking slowly with a walker.  I spent 90 minutes with patient/family, from 9:00-10:30a providing education, support and consultation.   Margaretmary Lombard, LCSW

## 2019-05-06 NOTE — Progress Notes (Signed)
   HPI: 85 y.o. male presenting today for evaluation of right foot pain.  Patient states that his foot kills him when he applies any pressure on his foot.  It hurts all over.  He does not want any oral medication or pain reliever.  He presents for further treatment and evaluation  Past Medical History:  Diagnosis Date  . Glaucoma   . History of blood clots   . Malabsorption syndrome        Physical Exam: General: The patient is alert and oriented x3 in no acute distress.  Dermatology: Skin is warm, dry and supple bilateral lower extremities. Negative for open lesions or macerations.  Skin is very frail with discoloration consistent with a possible venous congestion.  Vascular: Palpable pedal pulses bilaterally. No edema or erythema noted. Capillary refill within normal limits.  Skin is cool to touch.  Neurological: Epicritic and protective threshold grossly intact bilaterally.   Musculoskeletal Exam: Pain on palpation diffusely throughout the foot with limited range of motion likely consistent with DJD given the patient's age  Assessment: 1.  Generalized right foot pain   Plan of Care:  1. Patient evaluated.  2.  Oral anti-inflammatories and pain medication was offered however the patient declined. 3.  Recommend OTC topical analgesics such as Biofreeze 4.  Recommend good supportive shoe gear 5.  Given the patient's age I explained to the patient and his daughter that there is not much I could do other than oral medication to alleviate the patient's symptoms. 6.  Return to clinic as needed      Edrick Kins, DPM [facility]  Dr. Edrick Kins, DPM    [address], [place] [address]                Office [phone]  Fax [phone]

## 2019-05-07 ENCOUNTER — Ambulatory Visit (INDEPENDENT_AMBULATORY_CARE_PROVIDER_SITE_OTHER): Payer: Medicare Other | Admitting: Gastroenterology

## 2019-05-07 ENCOUNTER — Other Ambulatory Visit: Payer: Self-pay

## 2019-05-07 ENCOUNTER — Inpatient Hospital Stay
Admission: AD | Admit: 2019-05-07 | Discharge: 2019-06-17 | DRG: 329 | Disposition: E | Payer: Medicare Other | Source: Ambulatory Visit | Attending: Internal Medicine | Admitting: Internal Medicine

## 2019-05-07 ENCOUNTER — Encounter: Payer: Self-pay | Admitting: Gastroenterology

## 2019-05-07 ENCOUNTER — Inpatient Hospital Stay: Payer: Medicare Other

## 2019-05-07 VITALS — BP 108/73 | HR 79 | Temp 98.0°F | Ht 70.0 in | Wt 103.2 lb

## 2019-05-07 DIAGNOSIS — K660 Peritoneal adhesions (postprocedural) (postinfection): Secondary | ICD-10-CM | POA: Diagnosis present

## 2019-05-07 DIAGNOSIS — E46 Unspecified protein-calorie malnutrition: Secondary | ICD-10-CM | POA: Diagnosis not present

## 2019-05-07 DIAGNOSIS — H409 Unspecified glaucoma: Secondary | ICD-10-CM | POA: Diagnosis present

## 2019-05-07 DIAGNOSIS — R29818 Other symptoms and signs involving the nervous system: Secondary | ICD-10-CM | POA: Diagnosis not present

## 2019-05-07 DIAGNOSIS — E860 Dehydration: Secondary | ICD-10-CM | POA: Diagnosis present

## 2019-05-07 DIAGNOSIS — S36408A Unspecified injury of other part of small intestine, initial encounter: Secondary | ICD-10-CM | POA: Diagnosis not present

## 2019-05-07 DIAGNOSIS — Z85028 Personal history of other malignant neoplasm of stomach: Secondary | ICD-10-CM | POA: Diagnosis not present

## 2019-05-07 DIAGNOSIS — Z903 Acquired absence of stomach [part of]: Secondary | ICD-10-CM

## 2019-05-07 DIAGNOSIS — G9340 Encephalopathy, unspecified: Secondary | ICD-10-CM | POA: Diagnosis not present

## 2019-05-07 DIAGNOSIS — K9171 Accidental puncture and laceration of a digestive system organ or structure during a digestive system procedure: Secondary | ICD-10-CM | POA: Diagnosis not present

## 2019-05-07 DIAGNOSIS — Z87891 Personal history of nicotine dependence: Secondary | ICD-10-CM

## 2019-05-07 DIAGNOSIS — R918 Other nonspecific abnormal finding of lung field: Secondary | ICD-10-CM | POA: Diagnosis not present

## 2019-05-07 DIAGNOSIS — T17908A Unspecified foreign body in respiratory tract, part unspecified causing other injury, initial encounter: Secondary | ICD-10-CM

## 2019-05-07 DIAGNOSIS — Y658 Other specified misadventures during surgical and medical care: Secondary | ICD-10-CM | POA: Diagnosis not present

## 2019-05-07 DIAGNOSIS — Z515 Encounter for palliative care: Secondary | ICD-10-CM | POA: Diagnosis not present

## 2019-05-07 DIAGNOSIS — I872 Venous insufficiency (chronic) (peripheral): Secondary | ICD-10-CM | POA: Diagnosis present

## 2019-05-07 DIAGNOSIS — R634 Abnormal weight loss: Secondary | ICD-10-CM

## 2019-05-07 DIAGNOSIS — L89152 Pressure ulcer of sacral region, stage 2: Secondary | ICD-10-CM | POA: Diagnosis not present

## 2019-05-07 DIAGNOSIS — J690 Pneumonitis due to inhalation of food and vomit: Secondary | ICD-10-CM | POA: Diagnosis not present

## 2019-05-07 DIAGNOSIS — R1312 Dysphagia, oropharyngeal phase: Secondary | ICD-10-CM | POA: Diagnosis present

## 2019-05-07 DIAGNOSIS — Z980 Intestinal bypass and anastomosis status: Secondary | ICD-10-CM | POA: Diagnosis not present

## 2019-05-07 DIAGNOSIS — Z86718 Personal history of other venous thrombosis and embolism: Secondary | ICD-10-CM

## 2019-05-07 DIAGNOSIS — R0600 Dyspnea, unspecified: Secondary | ICD-10-CM

## 2019-05-07 DIAGNOSIS — Z538 Procedure and treatment not carried out for other reasons: Secondary | ICD-10-CM | POA: Diagnosis not present

## 2019-05-07 DIAGNOSIS — R45851 Suicidal ideations: Secondary | ICD-10-CM | POA: Diagnosis present

## 2019-05-07 DIAGNOSIS — I2510 Atherosclerotic heart disease of native coronary artery without angina pectoris: Secondary | ICD-10-CM | POA: Diagnosis not present

## 2019-05-07 DIAGNOSIS — K911 Postgastric surgery syndromes: Secondary | ICD-10-CM | POA: Diagnosis not present

## 2019-05-07 DIAGNOSIS — R451 Restlessness and agitation: Secondary | ICD-10-CM | POA: Diagnosis not present

## 2019-05-07 DIAGNOSIS — R1310 Dysphagia, unspecified: Secondary | ICD-10-CM | POA: Diagnosis not present

## 2019-05-07 DIAGNOSIS — R059 Cough, unspecified: Secondary | ICD-10-CM

## 2019-05-07 DIAGNOSIS — R05 Cough: Secondary | ICD-10-CM

## 2019-05-07 DIAGNOSIS — Z66 Do not resuscitate: Secondary | ICD-10-CM | POA: Diagnosis not present

## 2019-05-07 DIAGNOSIS — J9601 Acute respiratory failure with hypoxia: Secondary | ICD-10-CM | POA: Diagnosis not present

## 2019-05-07 DIAGNOSIS — E43 Unspecified severe protein-calorie malnutrition: Secondary | ICD-10-CM | POA: Diagnosis present

## 2019-05-07 DIAGNOSIS — Z8673 Personal history of transient ischemic attack (TIA), and cerebral infarction without residual deficits: Secondary | ICD-10-CM | POA: Diagnosis not present

## 2019-05-07 DIAGNOSIS — K567 Ileus, unspecified: Secondary | ICD-10-CM | POA: Diagnosis not present

## 2019-05-07 DIAGNOSIS — R339 Retention of urine, unspecified: Secondary | ICD-10-CM | POA: Diagnosis not present

## 2019-05-07 DIAGNOSIS — R633 Feeding difficulties: Secondary | ICD-10-CM | POA: Diagnosis not present

## 2019-05-07 DIAGNOSIS — F329 Major depressive disorder, single episode, unspecified: Secondary | ICD-10-CM | POA: Diagnosis present

## 2019-05-07 DIAGNOSIS — R0602 Shortness of breath: Secondary | ICD-10-CM

## 2019-05-07 DIAGNOSIS — E870 Hyperosmolality and hypernatremia: Secondary | ICD-10-CM | POA: Diagnosis not present

## 2019-05-07 DIAGNOSIS — E876 Hypokalemia: Secondary | ICD-10-CM | POA: Diagnosis not present

## 2019-05-07 DIAGNOSIS — R64 Cachexia: Secondary | ICD-10-CM | POA: Diagnosis present

## 2019-05-07 DIAGNOSIS — Z20828 Contact with and (suspected) exposure to other viral communicable diseases: Secondary | ICD-10-CM | POA: Diagnosis present

## 2019-05-07 DIAGNOSIS — Z681 Body mass index (BMI) 19 or less, adult: Secondary | ICD-10-CM | POA: Diagnosis not present

## 2019-05-07 DIAGNOSIS — L8990 Pressure ulcer of unspecified site, unspecified stage: Secondary | ICD-10-CM | POA: Insufficient documentation

## 2019-05-07 HISTORY — DX: Disorientation, unspecified: R41.0

## 2019-05-07 HISTORY — DX: Atherosclerotic heart disease of native coronary artery without angina pectoris: I25.10

## 2019-05-07 LAB — COMPREHENSIVE METABOLIC PANEL
ALT: 17 U/L (ref 0–44)
AST: 22 U/L (ref 15–41)
Albumin: 3.6 g/dL (ref 3.5–5.0)
Alkaline Phosphatase: 62 U/L (ref 38–126)
Anion gap: 8 (ref 5–15)
BUN: 35 mg/dL — ABNORMAL HIGH (ref 8–23)
CO2: 29 mmol/L (ref 22–32)
Calcium: 9.3 mg/dL (ref 8.9–10.3)
Chloride: 105 mmol/L (ref 98–111)
Creatinine, Ser: 0.74 mg/dL (ref 0.61–1.24)
GFR calc Af Amer: >60 mL/min (ref >60)
GFR calc non Af Amer: >60 mL/min (ref >60)
Glucose, Bld: 104 mg/dL — ABNORMAL HIGH (ref 70–99)
Potassium: 4.1 mmol/L (ref 3.5–5.1)
Sodium: 142 mmol/L (ref 135–145)
Total Bilirubin: 0.8 mg/dL (ref 0.3–1.2)
Total Protein: 6.9 g/dL (ref 6.5–8.1)

## 2019-05-07 LAB — CBC
HCT: 39.8 % (ref 39.0–52.0)
Hemoglobin: 12.4 g/dL — ABNORMAL LOW (ref 13.0–17.0)
MCH: 30.2 pg (ref 26.0–34.0)
MCHC: 31.2 g/dL (ref 30.0–36.0)
MCV: 97.1 fL (ref 80.0–100.0)
Platelets: 194 10*3/uL (ref 150–400)
RBC: 4.1 MIL/uL — ABNORMAL LOW (ref 4.22–5.81)
RDW: 12.9 % (ref 11.5–15.5)
WBC: 8.1 10*3/uL (ref 4.0–10.5)
nRBC: 0 % (ref 0.0–0.2)

## 2019-05-07 LAB — ABO/RH: ABO/RH(D): O POS

## 2019-05-07 LAB — SARS CORONAVIRUS 2 BY RT PCR (HOSPITAL ORDER, PERFORMED IN ~~LOC~~ HOSPITAL LAB): SARS Coronavirus 2: NEGATIVE

## 2019-05-07 MED ORDER — ONDANSETRON HCL 4 MG/2ML IJ SOLN
4.0000 mg | Freq: Four times a day (QID) | INTRAMUSCULAR | Status: DC | PRN
  Administered 2019-05-11: 18:00:00 4 mg via INTRAVENOUS
  Filled 2019-05-07 (×2): qty 2

## 2019-05-07 MED ORDER — BISACODYL 5 MG PO TBEC: 5.0000 mg | DELAYED_RELEASE_TABLET | Freq: Every day | ORAL | Status: DC | PRN

## 2019-05-07 MED ORDER — ACETAMINOPHEN 650 MG RE SUPP
650.0000 mg | Freq: Four times a day (QID) | RECTAL | Status: DC | PRN
  Administered 2019-05-21: 04:00:00 650 mg via RECTAL
  Filled 2019-05-07: qty 1

## 2019-05-07 MED ORDER — DOCUSATE SODIUM 100 MG PO CAPS
100.0000 mg | ORAL_CAPSULE | Freq: Two times a day (BID) | ORAL | Status: DC
  Filled 2019-05-07: qty 1

## 2019-05-07 MED ORDER — SODIUM CHLORIDE 0.9 % IV SOLN
INTRAVENOUS | Status: DC
  Administered 2019-05-07 – 2019-05-13 (×11): via INTRAVENOUS
  Administered 2019-05-14: 75 mL/h via INTRAVENOUS
  Administered 2019-05-14: 04:00:00 via INTRAVENOUS

## 2019-05-07 MED ORDER — ONDANSETRON HCL 4 MG PO TABS: 4.0000 mg | ORAL_TABLET | Freq: Four times a day (QID) | ORAL | Status: DC | PRN

## 2019-05-07 MED ORDER — HEPARIN SODIUM (PORCINE) 5000 UNIT/ML IJ SOLN
5000.0000 [IU] | Freq: Three times a day (TID) | INTRAMUSCULAR | Status: AC
  Administered 2019-05-07 – 2019-05-13 (×14): 5000 [IU] via SUBCUTANEOUS
  Filled 2019-05-07 (×14): qty 1

## 2019-05-07 MED ORDER — IOHEXOL 240 MG/ML SOLN: 50.0000 mL | Freq: Once | INTRAMUSCULAR | Status: DC | PRN

## 2019-05-07 MED ORDER — TRAZODONE HCL 50 MG PO TABS
25.0000 mg | ORAL_TABLET | Freq: Every evening | ORAL | Status: DC | PRN
  Administered 2019-05-18: 25 mg via ORAL
  Filled 2019-05-07: qty 1

## 2019-05-07 MED ORDER — ACETAMINOPHEN 325 MG PO TABS: 650.0000 mg | ORAL_TABLET | Freq: Four times a day (QID) | ORAL | Status: DC | PRN

## 2019-05-07 MED ORDER — IOHEXOL 300 MG/ML  SOLN
75.0000 mL | Freq: Once | INTRAMUSCULAR | Status: AC | PRN
  Administered 2019-05-07: 75 mL via INTRAVENOUS

## 2019-05-07 NOTE — H&P (Signed)
[place] [facility] [place] NAME: Brian Ferguson    MR#:  [id number]  DATE OF BIRTH:  May 21, 1929  DATE OF ADMISSION:  05/16/2019  PRIMARY CARE PHYSICIAN: Olin Hauser, DO   REQUESTING/REFERRING PHYSICIAN: Dr. Bonna Gains  CHIEF COMPLAINT: Dysphagia, weight loss  No chief complaint on file.   HISTORY OF PRESENT ILLNESS:  Brian Ferguson  is a 85 y.o. male with a known history of gastric cancer in 1987 sent in by Dr. Bonna Gains for IV fluids, NG tube placement.  Patient had gastric cancer in 1987 and has been having progressive weight loss for last 6 to 7 months, patient told me that he lost about 40 pounds in the last 3 months associated with dysphagia.  Patient was seen by speech therapy recommended n.p.o., referred to gastroenterology.  Patient denies any nausea or abdominal pain.  And he says that he likes to eat pizza and has no abdominal pain or dysphagia or difficulty swallowing.  Patient was evaluated by speech therapy and had trouble even with applesauce and therefore the recommended n.p.o. status.  Patient had a history of gastric cancer in 1987 status post to surgery two thirds of stomach removed along with submental stents by Dr. Pat Patrick 1989, patient did not have any follow-up with oncology, surgery or GI since then.  Patient went to GI clinic today for feeding tube placement but because of his weight loss, dysphagia with unknown cancer status patient is referred for admission.  PAST MEDICAL HISTORY:   Past Medical History:  Diagnosis Date  . Glaucoma   . History of blood clots   . Malabsorption syndrome     PAST SURGICAL HISTOIRY:   Past Surgical History:  Procedure Laterality Date  . HERNIA REPAIR    . STOMACH SURGERY      SOCIAL HISTORY:   Social History   Tobacco Use  . Smoking status: Former Research scientist (life sciences)  . Smokeless tobacco: Former Network engineer Use Topics  . Alcohol use: Not Currently    FAMILY HISTORY:  History  reviewed. No pertinent family history.  DRUG ALLERGIES:  No Known Allergies  REVIEW OF SYSTEMS:  CONSTITUTIONAL: No fever but cachectic.Marland Kitchen  Appears very weak. EYES: No blurred or double vision.  EARS, NOSE, AND THROAT: No tinnitus or ear pain.  RESPIRATORY: No cough, shortness of breath, wheezing or hemoptysis.  CARDIOVASCULAR: No chest pain, orthopnea, edema.  GASTROINTESTINAL: No nausea, vomiting, diarrhea or abdominal pain.  GENITOURINARY: No dysuria, hematuria.  ENDOCRINE: No polyuria, nocturia,  HEMATOLOGY: No anemia, easy bruising or bleeding SKIN: No rash or lesion. MUSCULOSKELETAL: No joint pain or arthritis.   NEUROLOGIC: No tingling, numbness, weakness.  PSYCHIATRY: No anxiety or depression.   MEDICATIONS AT HOME:   Prior to Admission medications   Not on File      VITAL SIGNS:  Blood pressure 131/73, pulse 64, temperature 97.6 F (36.4 C), resp. rate 16, height 5\' 10"  (1.778 m), weight 48 kg, SpO2 100 %.  PHYSICAL EXAMINATION:  GENERAL:  85 y.o.-year-old patient lying in the bed with no acute distress.  Patient appears cachectic. EYES: Pupils equal, round, reactive to light  No scleral icterus. Extraocular muscles intact.  HEENT: Head atraumatic, normocephalic. Oropharynx and nasopharynx clear.  NECK:  Supple, no jugular venous distention. No thyroid enlargement, no tenderness.  LUNGS: Normal breath sounds bilaterally, no wheezing, rales,rhonchi or crepitation. No use of accessory muscles of respiration.  CARDIOVASCULAR: S1, S2 normal. No murmurs, rubs, or gallops.  ABDOMEN:  Soft, nontender, nondistended. Bowel sounds present. No organomegaly or mass.  EXTREMITIES: No pedal edema, cyanosis, or clubbing.  NEUROLOGIC: Cranial nerves II through XII are intact. Muscle strength 5/5 in all extremities. Sensation intact. Gait not checked.  PSYCHIATRIC: The patient is alert and oriented x 3.  SKIN: No obvious rash, lesion, or ulcer.   LABORATORY PANEL:   CBC Recent  Labs  Lab 04/18/2019 1416  WBC 8.1  HGB 12.4*  HCT 39.8  PLT 194   ------------------------------------------------------------------------------------------------------------------  Chemistries  Recent Labs  Lab 04/27/2019 1416  NA 142  K 4.1  CL 105  CO2 29  GLUCOSE 104*  BUN 35*  CREATININE 0.74  CALCIUM 9.3  AST 22  ALT 17  ALKPHOS 62  BILITOT 0.8   ------------------------------------------------------------------------------------------------------------------  Cardiac Enzymes No results for input(s): TROPONINI in the last 168 hours. ------------------------------------------------------------------------------------------------------------------  RADIOLOGY:  No results found.  EKG:  No orders found for this or any previous visit.  IMPRESSION AND PLAN:  85 year old male patient with history of gastric cancer now has weight loss, dysphagia. 1.  Dysphagia, weight loss with cachexia, referred from gastroenterology clinic /as we do not have any previous records or surgeries gastroenterology recommended admission to hospital, continue n.p.o. status as per speech therapy, started on IV fluids, will insert Dobbhoff tube placement 2 .history of stomach cancer with weight loss, GI recommends CT chest abdomen pelvis. 3.  Dysphagia, GI consult requested, no tenderness at Dr. tolerated on call. CT chest, abdomen pelvis report, based on that anatomy of feeding tube placement will depend.  Patient understands.  All the records are reviewed and case discussed with ED provider. Management plans discussed with the patient, family and they are in agreement.  CODE STATUS: Full code  TOTAL TIME TAKING CARE OF THIS PATIENT: 55 minutes.    Epifanio Lesches M.D on 04/25/2019 at 4:44 PM  Between 7am to 6pm - Pager - [phone]  After 6pm go to [email or web address] - password [email or web address] [facility]  Office  [phone]  CC: Primary care physician; Olin Hauser, DO  Note: This dictation was prepared with Dragon dictation along with smaller phrase technology. Any transcriptional errors that result from this process are unintentional.

## 2019-05-07 NOTE — Progress Notes (Signed)
[facility] [address]  [place]  [place], [place] [address]  Main: [phone]  Fax: [phone]   Gastroenterology Consultation  Referring Provider:     Nobie Putnam * Primary Care Physician:  Olin Hauser, DO Reason for Consultation:     Dysphagia, weight loss        HPI:    Chief Complaint  Patient presents with  . Weight Loss  . Dysphagia    Brian Ferguson is a 85 y.o. y/o male referred for consultation & management  by Dr. Parks Ranger, Devonne Doughty, DO.  Patient accompanied by his daughter, who complains of dysphagia and weight loss for the last 6 to 7 months.  Patient appears quite cachectic.  Patient underwent speech pathology evaluation last week which reported severe oropharyngeal dysphagia, recommended n.p.o. diet, and referral to ENT or GI.  Patient denies any nausea or vomiting.  States he is able to eat macaroni and cheese and drink Ensure, but daughter states when he was evaluated by speech pathology he had trouble even with applesauce and therefore they recommended n.p.o. status.  Patient has history of gastric cancer in 1987 and he states he had two thirds of the stomach removed along with some intestines removed with Dr. Geryl Rankins in 1989.  He does not know why his intestines were removed.  He has not had any follow-up with oncology or surgery or any GI doctor since then.  He also reports in the 90s he had a small bowel obstruction and surgery in [place] at the time.  We do not have any of his previous records  Past Medical History:  Diagnosis Date  . Glaucoma   . History of blood clots   . Malabsorption syndrome     Past Surgical History:  Procedure Laterality Date  . HERNIA REPAIR    . STOMACH SURGERY      Prior to Admission medications   Not on File    No family history on file.   Social History   Tobacco Use  . Smoking status: Former Research scientist (life sciences)  . Smokeless tobacco: Former Network engineer Use Topics  .  Alcohol use: Yes  . Drug use: No    Allergies as of 05/03/2019  . (No Known Allergies)    Review of Systems:    All systems reviewed and negative except where noted in HPI.   Physical Exam:  BP 108/73   Pulse 79   Temp 98 F (36.7 C)   Ht 5\' 10"  (1.778 m)   Wt 103 lb 3.2 oz (46.8 kg)   BMI 14.81 kg/m  No LMP for male patient. Psych:  Alert and cooperative. Normal mood and affect. General:   Alert,  Well-developed, well-nourished, pleasant and cooperative in NAD Head:  Normocephalic and atraumatic. Eyes:  Sclera clear, no icterus.   Conjunctiva pink. Ears:  Normal auditory acuity. Nose:  No deformity, discharge, or lesions. Mouth:  No deformity or lesions,oropharynx pink & moist. Neck:  Supple; no masses or thyromegaly. Abdomen:  Normal bowel sounds.  No bruits.  Soft, non-tender and non-distended without masses, hepatosplenomegaly or hernias noted.  No guarding or rebound tenderness.    Msk:  Symmetrical without gross deformities. Good, equal movement & strength bilaterally. Pulses:  Normal pulses noted. Extremities:  No clubbing or edema.  No cyanosis. Neurologic:  Alert and oriented x3;  grossly normal neurologically. Skin:  Intact without significant lesions or rashes. No jaundice. Lymph Nodes:  No significant cervical adenopathy. Psych:  Alert and cooperative. Normal mood and affect.   Labs: CBC    Component Value Date/Time   WBC 9.4 03/18/2019 1122   RBC 4.36 03/18/2019 1122   HGB 12.7 (L) 03/18/2019 1122   HGB 13.8 10/18/2011 1442   HCT 40.4 03/18/2019 1122   HCT 43.3 10/18/2011 1442   PLT 200 03/18/2019 1122   PLT 157 10/18/2011 1442   MCV 92.7 03/18/2019 1122   MCV 94 10/18/2011 1442   MCH 29.1 03/18/2019 1122   MCHC 31.4 (L) 03/18/2019 1122   RDW 12.4 03/18/2019 1122   RDW 13.1 10/18/2011 1442   LYMPHSABS 1,513 03/18/2019 1122   LYMPHSABS 1.5 10/18/2011 1442   MONOABS 0.7 10/18/2011 1442   EOSABS 47 03/18/2019 1122   EOSABS 0.1 10/18/2011 1442    BASOSABS 38 03/18/2019 1122   BASOSABS 0.0 10/18/2011 1442   CMP     Component Value Date/Time   NA 142 03/18/2019 1122   NA 138 10/18/2011 1442   K 4.6 03/18/2019 1122   K 4.0 10/18/2011 1442   CL 105 03/18/2019 1122   CL 101 10/18/2011 1442   CO2 29 03/18/2019 1122   CO2 31 10/18/2011 1442   GLUCOSE 100 03/18/2019 1122   GLUCOSE 106 (H) 10/18/2011 1442   BUN 31 (H) 03/18/2019 1122   BUN 24 (H) 10/18/2011 1442   CREATININE 0.83 03/18/2019 1122   CALCIUM 9.5 03/18/2019 1122   CALCIUM 9.1 10/18/2011 1442   PROT 6.8 03/18/2019 1122   AST 16 03/18/2019 1122   ALT 10 03/18/2019 1122   BILITOT 0.8 03/18/2019 1122   GFRNONAA 79 03/18/2019 1122   GFRAA 91 03/18/2019 1122    Imaging Studies: Dg Swallow Func Op Medicare Speech Path  Result Date: 05/01/2019 CLINICAL DATA:  Weight loss.  Dysphagia. EXAM: MODIFIED BARIUM SWALLOW TECHNIQUE: Different consistencies of barium were administered orally to the patient by the Speech Pathologist. Imaging of the pharynx was performed in the lateral projection. The radiologist was present in the fluoroscopy room for this study, providing personal supervision. FLUOROSCOPY TIME:  Fluoroscopy Time:  1 minutes Number of Acquired Spot Images: 0 COMPARISON:  None. FINDINGS: There was aspiration during the study. Please see the speech pathologist's report for full details. IMPRESSION: There was aspiration during the study. Please see the speech pathologist's report for full details. Please refer to the Speech Pathologists report for complete details and recommendations. Electronically Signed   By: Dorise Bullion III M.D   On: 05/01/2019 14:08   Dg Foot Complete Right  Result Date: 05/02/2019 Please see detailed radiograph report in office note.   Assessment and Plan:   Brian Ferguson is a 85 y.o. y/o male has been referred for weight loss and dysphagia  This is a complicated patient, with no previous records available and previous history of gastric  cancer and surgeries in the past  Endoscopic feeding tube placement may or may not be possible given his altered anatomy due to his previous surgeries.  In addition, given his cachexia and advanced age unclear if he will be able to tolerate sedation for endoscopy  Since speech pathology has recommended n.p.o. status, it would be best for patient to be admitted to the hospital, get IV fluids as needed to prevent dehydration due to the n.p.o. status.  Get Dobbhoff tube placement for feeds until a feeding tube can be placed.  Rather feeding tube placement will depend on his anatomy and ablility to tolerate procedures  I have discussed the options with  the patient and his daughter extensively and discussed that outpatient work-up would require obtaining previous records, getting CT scans to delineate his anatomy, referrals to other specialties if needed if feeding tube placement cannot be done endoscopically and this work-up can take weeks.  Given that he is getting dehydrated due to his n.p.o. status and inability to eat much at home, they are willing to be admitted to the hospital for work-up  I have talked to Dr. Vianne Bulls, hospitalist on call and she has accepted the patient for admission  Would recommend CT scan of the abdomen and pelvis and chest to evaluate his anatomy and rule out any underlying lesions.  This would help evaluate if endoscopic feeding tube placement would even be possible given his small stomach post surgery, and also would allow to rule out any large lesions on CT scan.  Would recommend inpatient GI consult on admission  It was also discussed with the patient that feeding tubes do not take away the risk of aspiration and other side effects such as infection, perforation can occur with feeding tube placement.  Again the route of feeding tube placement, to be done surgically, via IR, or via GI will depend on what above work-up and CT scans show  Dr Vonda Antigua  Speech  recognition software was used to dictate the above note.

## 2019-05-07 NOTE — Progress Notes (Addendum)
Please note, patient is currently followed by outpatient Palliative at home. CSW [employer] made aware.  Flo Shanks BSN, RN, [facility] [employer] [phone]

## 2019-05-08 LAB — BASIC METABOLIC PANEL
Anion gap: 6 (ref 5–15)
BUN: 32 mg/dL — ABNORMAL HIGH (ref 8–23)
CO2: 27 mmol/L (ref 22–32)
Calcium: 8.6 mg/dL — ABNORMAL LOW (ref 8.9–10.3)
Chloride: 109 mmol/L (ref 98–111)
Creatinine, Ser: 0.6 mg/dL — ABNORMAL LOW (ref 0.61–1.24)
GFR calc Af Amer: >60 mL/min (ref >60)
GFR calc non Af Amer: >60 mL/min (ref >60)
Glucose, Bld: 92 mg/dL (ref 70–99)
Potassium: 4 mmol/L (ref 3.5–5.1)
Sodium: 142 mmol/L (ref 135–145)

## 2019-05-08 LAB — GLUCOSE, CAPILLARY
Glucose-Capillary: 68 mg/dL — ABNORMAL LOW (ref 70–99)
Glucose-Capillary: 80 mg/dL (ref 70–99)

## 2019-05-08 LAB — CBC
HCT: 38.4 % — ABNORMAL LOW (ref 39.0–52.0)
Hemoglobin: 11.9 g/dL — ABNORMAL LOW (ref 13.0–17.0)
MCH: 29.8 pg (ref 26.0–34.0)
MCHC: 31 g/dL (ref 30.0–36.0)
MCV: 96 fL (ref 80.0–100.0)
Platelets: 177 10*3/uL (ref 150–400)
RBC: 4 MIL/uL — ABNORMAL LOW (ref 4.22–5.81)
RDW: 12.9 % (ref 11.5–15.5)
WBC: 6.7 10*3/uL (ref 4.0–10.5)
nRBC: 0 % (ref 0.0–0.2)

## 2019-05-08 MED ORDER — SODIUM CHLORIDE 0.9 % IV SOLN: INTRAVENOUS | Status: DC

## 2019-05-08 MED ORDER — CEFAZOLIN SODIUM-DEXTROSE 2-4 GM/100ML-% IV SOLN
2.0000 g | Freq: Once | INTRAVENOUS | Status: DC
  Filled 2019-05-08: qty 100

## 2019-05-08 NOTE — Progress Notes (Signed)
Initial Nutrition Assessment  RD working remotely.  DOCUMENTATION CODES:   Underweight  INTERVENTION:  Once Dobbhoff tube placed and confirmed, recommend initiating Osmolite 1.5 Cal at 15 mL/hr and advancing by 15 mL/hr every 12 hours to goal rate of 45 mL/hr. Also provide Pro-Stat 30 mL once daily per tube. Goal regimen provides 1720 kcal, 83 grams of protein, 821 mL H2O daily.  While on IV fluids provide a minimum free water flush of 30 mL Q4hrs to maintain tube patency. If plan is to transition to meeting fluid needs with enteral regimen, recommend a free water flush of 120 mL Q4hrs. Provides 1541 mL H2O daily including water in TF regimen.  If patient does not tolerate Osmolite can consider using a peptide-based formula (Vital).   Provide liquid MVI daily per tube.  Monitor magnesium, potassium, and phosphorus daily for at least 3 days, MD to replete as needed, as pt is at risk for refeeding syndrome.  NUTRITION DIAGNOSIS:   Inadequate oral intake related to inability to eat as evidenced by NPO status.  GOAL:   Patient will meet greater than or equal to 90% of their needs  MONITOR:   Labs, Weight trends, TF tolerance, I & O's  REASON FOR ASSESSMENT:   Consult Assessment of nutrition requirement/status  ASSESSMENT:   85 year old male with PMHx of gastric cancer in 1987 s/p removal of 2/3 of stomach, hx resection of some bowel in 1989 (unsure of reason removed), glaucoma, malabsorption syndrome admitted from GI clinic for dysphagia, weight loss with cachexia.   Attempted to call patient over the phone for nutrition/weight history but he was unable to answer the phone. There is a very thorough history in GI note from yesterday. Patient is followed by outpatient palliative at home. He has had dysphagia and weight loss for the past 6-7 months and per chart patient is cachetic. Patient underwent MBSS on 05/01/2019 and recommendation was for patient to remain NPO. Patient reported  to GI MD that he had 2/3 of stomach removed in 1987 for cancer and also had a bowel resection in 1989 and then another operation for SBO at later time. No records are available. Noted there was a hx of malabsorption syndrome listed in chart. There is a risk that patient will have difficulty absorbing enteral nutrition so will need to monitor. CT Abd/Pelvis was completed yesterday but still pending read so will monitor for comments on anatomy. Plan is for placement of Dobbhoff tube by IR for initiation of enteral nutrition and patient will also receive IV fluids. He will undergo work-up to see if it is possible for G-tube to be placed with his anatomy.  Per weight history in chart patient's recent UBW appeared to be around 66 kg. He was 65.8 kg on 05/11/2018 and 66.7 kg on 11/08/2018. He is now 48 kg (105.82 lbs). He has lost 18.7 kg (28% body weight) over the past 6 months, which is significant for time frame. This is a very concerning amount of weight loss as >20% over one year is significant and he has lost more than this is only 6 months.  Medications reviewed and include: Colace 100 mg BID, NS at 75 mL/hr.  Labs reviewed: CBG 68, BUN 32, Creatinine 0.6.  Strongly suspect patient would meet criteria for severe chronic malnutrition and noted he has been diagnosed with this recently by his PCP. RD unable to confirm if patients meets criteria without completing NFPE.  NUTRITION - FOCUSED PHYSICAL EXAM:  Unable to  complete at this time.  Diet Order:   Diet Order            Diet NPO time specified  Diet effective now             EDUCATION NEEDS:   No education needs have been identified at this time  Skin:  Skin Assessment: Reviewed RN Assessment  Last BM:  Unknown  Height:   Ht Readings from Last 1 Encounters:  05/05/2019 5\' 10"  (1.778 m)   Weight:   Wt Readings from Last 1 Encounters:  04/30/2019 48 kg   Ideal Body Weight:  75.5 kg  BMI:  Body mass index is 15.18  kg/m.  Estimated Nutritional Needs:   Kcal:  1500-1700  Protein:  72-86 grams  Fluid:  1.5 L/day  Willey Blade, MS, RD, LDN Office: [phone] Pager: [phone] After Hours/Weekend Pager: [phone]

## 2019-05-08 NOTE — H&P (View-Only) (Signed)
[facility] GI Inpatient Consult Note   Kathline Magic, M.D.  Reason for Consult: Feeding problem, dysphagia, abnormal swallowing study.   Attending Requesting Consult: Bettey Costa, M.D.   History of Present Illness: Brian Ferguson is a 85 y.o. male presents after hospital admission for dysphagia, malnutrition, dehydration weight loss.  Patient was seen yesterday by Dr. Bonna Gains of [facility] and sent for elective admission and asked that I see the patient.  Patient was seen today and complains of weakness, dysphagia to liquids and solids but does claim that he is very hungry.  He has lost over 50 pounds in the last 3 months.  He has a remote history of gastric cancer diagnosed in 1987 and underwent 2 surgeries.  He is undergone laparotomies as well for partial small bowel obstructions.  His last colonoscopy was over 10 years ago and was "normal". The patient underwent CT scan of the abdomen showing no obvious recurrence of gastric cancer.  I spoke with the radiologist, Dr. Candise Che, who says that the patient had still has retained approximately one third of his stomach including the cardia and fundus and part possibly part of the body of the stomach.  There was some gastric wall thickening without obvious tumor involvement.  Past Medical History:  Past Medical History:  Diagnosis Date   Cancer ([place])    stomach   Glaucoma    History of blood clots    Malabsorption syndrome     Problem List: Patient Active Problem List   Diagnosis Date Noted   Cachexia ([place]) 04/17/2019   History of cancer of stomach 03/18/2019   Severe protein-calorie malnutrition ([place]) 03/18/2019   Recurrent falls 03/18/2019   Generalized weakness 03/18/2019    Past Surgical History: Past Surgical History:  Procedure Laterality Date   HERNIA REPAIR     STOMACH SURGERY      Allergies: No Known Allergies  Home Medications: No medications prior to admission.   Home  medication reconciliation was completed with the patient.   Scheduled Inpatient Medications:    docusate sodium  100 mg Oral BID   heparin  5,000 Units Subcutaneous Q8H    Continuous Inpatient Infusions:    sodium chloride 75 mL/hr at 05/08/19 0702    PRN Inpatient Medications:  acetaminophen **OR** acetaminophen, bisacodyl, iohexol, ondansetron **OR** ondansetron (ZOFRAN) IV, traZODone  Family History: family history is not on file.   GI Family History: Negative  Social History:   reports that he has quit smoking. He has quit using smokeless tobacco. He reports previous alcohol use. He reports that he does not use drugs. The patient denies ETOH, tobacco, or drug use.    Review of Systems: Review of Systems - Negative except HPI  Physical Examination: BP 135/89    Pulse 77    Temp 98.3 F (36.8 C) (Oral)    Resp 20    Ht _0  (1.778 m)    Wt 48 kg    SpO2 98%    BMI 15.18 kg/m  Physical Exam Vitals signs reviewed.  Constitutional:      General: He is not in acute distress.    Appearance: Normal appearance. He is ill-appearing. He is not diaphoretic.  HENT:     Head: Normocephalic and atraumatic.  Eyes:     Conjunctiva/sclera: Conjunctivae normal.     Pupils: Pupils are equal, round, and reactive to light.  Cardiovascular:     Rate and Rhythm: Normal rate.     Pulses: Normal pulses.  Pulmonary:     Effort: Pulmonary effort is normal.     Breath sounds: Normal breath sounds.  Abdominal:     General: Abdomen is flat.     Palpations: Abdomen is soft.       Comments: Scaphoid, multiple scars  Musculoskeletal: Normal range of motion.  Skin:    General: Skin is warm and dry.  Neurological:     Mental Status: He is alert.     Data: Lab Results  Component Value Date   WBC 6.7 05/08/2019   HGB 11.9 (L) 05/08/2019   HCT 38.4 (L) 05/08/2019   MCV 96.0 05/08/2019   PLT 177 05/08/2019   Recent Labs  Lab 04/18/2019 1416 05/08/19 0251  HGB 12.4* 11.9*    Lab Results  Component Value Date   NA 142 05/08/2019   K 4.0 05/08/2019   CL 109 05/08/2019   CO2 27 05/08/2019   BUN 32 (H) 05/08/2019   CREATININE 0.60 (L) 05/08/2019   Lab Results  Component Value Date   ALT 17 05/06/2019   AST 22 05/06/2019   ALKPHOS 62 05/06/2019   BILITOT 0.8 04/22/2019   No results for input(s): APTT, INR, PTT in the last 168 hours. CBC Latest Ref Rng & Units 05/08/2019 04/27/2019 03/18/2019  WBC 4.0 - 10.5 K/uL 6.7 8.1 9.4  Hemoglobin 13.0 - 17.0 g/dL 11.9(L) 12.4(L) 12.7(L)  Hematocrit 39.0 - 52.0 % 38.4(L) 39.8 40.4  Platelets 150 - 400 K/uL 177 194 200    STUDIES: Ct Chest W Contrast  Result Date: 05/08/2019 CLINICAL DATA:  Remote history of gastric cancer. Patient is having progressive weight loss. Symptoms of dysphagia. EXAM: CT CHEST, ABDOMEN, AND PELVIS WITH CONTRAST TECHNIQUE: Multidetector CT imaging of the chest, abdomen and pelvis was performed following the standard protocol during bolus administration of intravenous contrast. CONTRAST:  81m OMNIPAQUE IOHEXOL 300 MG/ML  SOLN COMPARISON:  None. FINDINGS: CT CHEST FINDINGS Cardiovascular: The heart is normal in size. There is age-related tortuosity, ectasia and calcification of the thoracic aorta but no dissection or focal aneurysm. The branch vessels are patent. Moderate three-vessel coronary artery calcifications are noted. Mediastinum/Nodes: No mediastinal or hilar mass or lymphadenopathy. The esophagus is grossly normal. Lungs/Pleura: There is a 2.5 x 1.4 x 1.3 cm lesion in the right upper lobe suspicious for neoplasm but possibly scar tissue. Without prior studies for comparison PET-CT may be helpful for further evaluation. Patchy areas of tree-in-bud opacity in the left upper lobe and left lower lobe suggesting chronic inflammation or possibly atypical infection such as MAC. Endobronchial debris in the right lower lobe along with peribronchial thickening may suggest aspiration. No right lower lobe  pneumonia. Patchy atelectasis or scarring change noted in the superior segment of the left lower lobe. Musculoskeletal: Fairly marked cachexia. No chest wall mass or axillary adenopathy. The bony structures are intact.  No bone lesions are identified. CT ABDOMEN PELVIS FINDINGS Hepatobiliary: Numerous hepatic cysts. No worrisome hepatic lesions or intrahepatic biliary dilatation. Pancreas: No mass, inflammation or ductal dilatation. Moderate pancreatic atrophy. Spleen: Normal size.  No worrisome lesions. Adrenals/Urinary Tract: The adrenal glands and kidneys are unremarkable. A right renal cyst is noted with some thin peripheral calcifications. No hydronephrosis. The bladder is grossly normal. Stomach/Bowel: Surgical changes noted involving the stomach but I do not see any findings suspicious for recurrent mass. The small bowel and colon are grossly normal. Vascular/Lymphatic: Marked tortuosity and mild ectasia of the abdominal aorta with moderate atherosclerotic calcifications but no focal aneurysm or  dissection. The branch vessels are patent. There is marked tortuosity, ectasia and calcification of the iliac arteries. 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 No obvious mesenteric or retroperitoneal mass or adenopathy. Reproductive: The prostate gland and seminal vesicles are grossly normal. Other: No pelvic mass or free pelvic fluid collections. Musculoskeletal: Scoliosis and advanced degenerative lumbar spondylosis but no worrisome bone lesions. IMPRESSION: 1. Worrisome right upper lobe pulmonary lesion measuring 2.5 x 1.4 x 1.3 cm. PET-CT may be helpful for further evaluation. No mediastinal or hilar lymphadenopathy or evidence of metastatic disease elsewhere. 2. Patchy areas of tree-in-bud appearance suggesting chronic inflammation or atypical infection such as MAC. 3. Moderate amount of endobronchial debris in the right lower lobe along with peribronchial thickening  possibly due to aspiration but no definite findings for pneumonia. 4. Numerous hepatic cysts but no worrisome hepatic lesions. There is also a complex upper pole right renal cyst with areas of peripheral calcification but no solid enhancing components. 5. Advanced vascular disease. 6. Surgical changes from gastric surgery without complicating features. I do not see any definite findings for recurrent gastric mass. Electronically Signed   By: P.  Gallerani M.D.   On: 05/08/2019 08:51  ° °Ct Abdomen Pelvis W Contrast ° °Result Date: 05/08/2019 °CLINICAL DATA:  Remote history of gastric cancer. Patient is having progressive weight loss. Symptoms of dysphagia. EXAM: CT CHEST, ABDOMEN, AND PELVIS WITH CONTRAST TECHNIQUE: Multidetector CT imaging of the chest, abdomen and pelvis was performed following the standard protocol during bolus administration of intravenous contrast. CONTRAST:  75mL OMNIPAQUE IOHEXOL 300 MG/ML  SOLN COMPARISON:  None. FINDINGS: CT CHEST FINDINGS Cardiovascular: The heart is normal in size. There is age-related tortuosity, ectasia and calcification of the thoracic aorta but no dissection or focal aneurysm. The branch vessels are patent. Moderate three-vessel coronary artery calcifications are noted. Mediastinum/Nodes: No mediastinal or hilar mass or lymphadenopathy. The esophagus is grossly normal. Lungs/Pleura: There is a 2.5 x 1.4 x 1.3 cm lesion in the right upper lobe suspicious for neoplasm but possibly scar tissue. Without prior studies for comparison PET-CT may be helpful for further evaluation. Patchy areas of tree-in-bud opacity in the left upper lobe and left lower lobe suggesting chronic inflammation or possibly atypical infection such as MAC. Endobronchial debris in the right lower lobe along with peribronchial thickening may suggest aspiration. No right lower lobe pneumonia. Patchy atelectasis or scarring change noted in the superior segment of the left lower lobe. Musculoskeletal:  Fairly marked cachexia. No chest wall mass or axillary adenopathy. The bony structures are intact.  No bone lesions are identified. CT ABDOMEN PELVIS FINDINGS Hepatobiliary: Numerous hepatic cysts. No worrisome hepatic lesions or intrahepatic biliary dilatation. Pancreas: No mass, inflammation or ductal dilatation. Moderate pancreatic atrophy. Spleen: Normal size.  No worrisome lesions. Adrenals/Urinary Tract: The adrenal glands and kidneys are unremarkable. A right renal cyst is noted with some thin peripheral calcifications. No hydronephrosis. The bladder is grossly normal. Stomach/Bowel: Surgical changes noted involving the stomach but I do not see any findings suspicious for recurrent mass. The small bowel and colon are grossly normal. Vascular/Lymphatic: Marked tortuosity and mild ectasia of the abdominal aorta with moderate atherosclerotic calcifications but no focal aneurysm or dissection. The branch vessels are patent. There is marked tortuosity, ectasia and calcification of the iliac arteries. 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 No obvious mesenteric or retroperitoneal mass or adenopathy. Reproductive: The prostate gland and seminal vesicles are grossly normal.   Other: No pelvic mass or free pelvic fluid collections. Musculoskeletal: Scoliosis and advanced degenerative lumbar spondylosis but no worrisome bone lesions. IMPRESSION: 1. Worrisome right upper lobe pulmonary lesion measuring 2.5 x 1.4 x 1.3 cm. PET-CT may be helpful for further evaluation. No mediastinal or hilar lymphadenopathy or evidence of metastatic disease elsewhere. 2. Patchy areas of tree-in-bud appearance suggesting chronic inflammation or atypical infection such as MAC. 3. Moderate amount of endobronchial debris in the right lower lobe along with peribronchial thickening possibly due to aspiration but no definite findings for pneumonia. 4. Numerous hepatic cysts but no worrisome hepatic  lesions. There is also a complex upper pole right renal cyst with areas of peripheral calcification but no solid enhancing components. 5. Advanced vascular disease. 6. Surgical changes from gastric surgery without complicating features. I do not see any definite findings for recurrent gastric mass. Electronically Signed   By: Marijo Sanes M.D.   On: 05/08/2019 08:51   _0 @  Assessment: 1. Oropharyngeal dysphagia - LIkely CNS etiology.   2. Hx of gastric cancer - No obvious bowel obstruction or recurrent CA on CT scan of abdomen/pelvis.     COVID-19 status: Tested negative  Recommendations:  1. IV hydratrion, NG tube feeding temporarily.  2. EGD/PEG when cilinicaly feasible. I discussed the EGD and PEG placement with the patient and his daughter, Eutimio Gharibian, Durable power of attorney for the patient. The patient understands the nature of the planned procedure, indications, risks, alternatives and potential complications including but not limited to bleeding, infection, perforation, damage to internal organs and possible oversedation/side effects from anesthesia. The patient and Elya Diloreto agree and give  consent to proceed. Phone consent obtained from Bing Neighbors and witnessed by Freddie Breech, RN on the hospital floor.  Please refer to procedure notes for findings, recommendations and patient disposition/instructions.  Thank you for the consult. Please call with questions or concerns.  Olean Ree, "Lanny Hurst MD [facility] [place], [place] [address] [phone]  05/08/2019 12:14 PM

## 2019-05-08 NOTE — Progress Notes (Signed)
[facility] NAME: Brian Ferguson    MR#:  [id number]  DATE OF BIRTH:  1929-07-02  SUBJECTIVE:   Patient plan for Dobbhoff tube.  REVIEW OF SYSTEMS:    Review of Systems  Constitutional: Negative for fever, chills ++ poor appetite with weight loss HENT: Negative for ear pain, nosebleeds, congestion, facial swelling, rhinorrhea, neck pain, neck stiffness and ear discharge.   Respiratory: Negative for cough, shortness of breath, wheezing  Cardiovascular: Negative for chest pain, palpitations and leg swelling.  Gastrointestinal: Negative for heartburn, abdominal pain, vomiting, diarrhea or consitpation Genitourinary: Negative for dysuria, urgency, frequency, hematuria Musculoskeletal: Negative for back pain or joint pain Neurological: Negative for dizziness, seizures, syncope, focal weakness,  numbness and headaches.  Hematological: Does not bruise/bleed easily.  Psychiatric/Behavioral: Negative for hallucinations, confusion, dysphoric mood    Tolerating Diet: npo      DRUG ALLERGIES:  No Known Allergies  VITALS:  Blood pressure 135/89, pulse 77, temperature 98.3 F (36.8 C), temperature source Oral, resp. rate 20, height _0  (1.778 m), weight 48 kg, SpO2 98 %.  PHYSICAL EXAMINATION:  Constitutional: Appears frail thin cachectic no distress. HENT: Normocephalic. Marland Kitchen Oropharynx is clear and moist.  Eyes: Conjunctivae and EOM are normal. PERRLA, no scleral icterus.  Neck: Normal ROM. Neck supple. No JVD. No tracheal deviation. CVS: RRR, S1/S2 +, no murmurs, no gallops, no carotid bruit.  Pulmonary: Effort and breath sounds normal, no stridor, rhonchi, wheezes, rales.  Abdominal: Soft. BS +,  no distension, tenderness, rebound or guarding.  Musculoskeletal: Normal range of motion. No edema and no tenderness.  Neuro: Alert. CN 2-12 grossly intact. No focal deficits. Skin: Skin is warm and dry. No rash noted. Psychiatric:  Normal mood and affect.      LABORATORY PANEL:   CBC Recent Labs  Lab 05/08/19 0251  WBC 6.7  HGB 11.9*  HCT 38.4*  PLT 177   ------------------------------------------------------------------------------------------------------------------  Chemistries  Recent Labs  Lab 04/16/2019 1416 05/08/19 0251  NA 142 142  K 4.1 4.0  CL 105 109  CO2 29 27  GLUCOSE 104* 92  BUN 35* 32*  CREATININE 0.74 0.60*  CALCIUM 9.3 8.6*  AST 22  --   ALT 17  --   ALKPHOS 62  --   BILITOT 0.8  --    ------------------------------------------------------------------------------------------------------------------  Cardiac Enzymes No results for input(s): TROPONINI in the last 168 hours. ------------------------------------------------------------------------------------------------------------------  RADIOLOGY:  Ct Chest W Contrast  Result Date: 05/08/2019 CLINICAL DATA:  Remote history of gastric cancer. Patient is having progressive weight loss. Symptoms of dysphagia. EXAM: CT CHEST, ABDOMEN, AND PELVIS WITH CONTRAST TECHNIQUE: Multidetector CT imaging of the chest, abdomen and pelvis was performed following the standard protocol during bolus administration of intravenous contrast. CONTRAST:  82m OMNIPAQUE IOHEXOL 300 MG/ML  SOLN COMPARISON:  None. FINDINGS: CT CHEST FINDINGS Cardiovascular: The heart is normal in size. There is age-related tortuosity, ectasia and calcification of the thoracic aorta but no dissection or focal aneurysm. The branch vessels are patent. Moderate three-vessel coronary artery calcifications are noted. Mediastinum/Nodes: No mediastinal or hilar mass or lymphadenopathy. The esophagus is grossly normal. Lungs/Pleura: There is a 2.5 x 1.4 x 1.3 cm lesion in the right upper lobe suspicious for neoplasm but possibly scar tissue. Without prior studies for comparison PET-CT may be helpful for further evaluation. Patchy areas of tree-in-bud opacity in the left upper lobe and  left lower lobe suggesting chronic inflammation or possibly atypical infection  such as MAC. Endobronchial debris in the right lower lobe along with peribronchial thickening may suggest aspiration. No right lower lobe pneumonia. Patchy atelectasis or scarring change noted in the superior segment of the left lower lobe. Musculoskeletal: Fairly marked cachexia. No chest wall mass or axillary adenopathy. The bony structures are intact.  No bone lesions are identified. CT ABDOMEN PELVIS FINDINGS Hepatobiliary: Numerous hepatic cysts. No worrisome hepatic lesions or intrahepatic biliary dilatation. Pancreas: No mass, inflammation or ductal dilatation. Moderate pancreatic atrophy. Spleen: Normal size.  No worrisome lesions. Adrenals/Urinary Tract: The adrenal glands and kidneys are unremarkable. A right renal cyst is noted with some thin peripheral calcifications. No hydronephrosis. The bladder is grossly normal. Stomach/Bowel: Surgical changes noted involving the stomach but I do not see any findings suspicious for recurrent mass. The small bowel and colon are grossly normal. Vascular/Lymphatic: Marked tortuosity and mild ectasia of the abdominal aorta with moderate atherosclerotic calcifications but no focal aneurysm or dissection. The branch vessels are patent. There is marked tortuosity, ectasia and calcification of the iliac arteries. 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 No obvious mesenteric or retroperitoneal mass or adenopathy. Reproductive: The prostate gland and seminal vesicles are grossly normal. Other: No pelvic mass or free pelvic fluid collections. Musculoskeletal: Scoliosis and advanced degenerative lumbar spondylosis but no worrisome bone lesions. IMPRESSION: 1. Worrisome right upper lobe pulmonary lesion measuring 2.5 x 1.4 x 1.3 cm. PET-CT may be helpful for further evaluation. No mediastinal or hilar lymphadenopathy or evidence of metastatic disease  elsewhere. 2. Patchy areas of tree-in-bud appearance suggesting chronic inflammation or atypical infection such as MAC. 3. Moderate amount of endobronchial debris in the right lower lobe along with peribronchial thickening possibly due to aspiration but no definite findings for pneumonia. 4. Numerous hepatic cysts but no worrisome hepatic lesions. There is also a complex upper pole right renal cyst with areas of peripheral calcification but no solid enhancing components. 5. Advanced vascular disease. 6. Surgical changes from gastric surgery without complicating features. I do not see any definite findings for recurrent gastric mass. Electronically Signed   By: Marijo Sanes M.D.   On: 05/08/2019 08:51   Ct Abdomen Pelvis W Contrast  Result Date: 05/08/2019 CLINICAL DATA:  Remote history of gastric cancer. Patient is having progressive weight loss. Symptoms of dysphagia. EXAM: CT CHEST, ABDOMEN, AND PELVIS WITH CONTRAST TECHNIQUE: Multidetector CT imaging of the chest, abdomen and pelvis was performed following the standard protocol during bolus administration of intravenous contrast. CONTRAST:  42m OMNIPAQUE IOHEXOL 300 MG/ML  SOLN COMPARISON:  None. FINDINGS: CT CHEST FINDINGS Cardiovascular: The heart is normal in size. There is age-related tortuosity, ectasia and calcification of the thoracic aorta but no dissection or focal aneurysm. The branch vessels are patent. Moderate three-vessel coronary artery calcifications are noted. Mediastinum/Nodes: No mediastinal or hilar mass or lymphadenopathy. The esophagus is grossly normal. Lungs/Pleura: There is a 2.5 x 1.4 x 1.3 cm lesion in the right upper lobe suspicious for neoplasm but possibly scar tissue. Without prior studies for comparison PET-CT may be helpful for further evaluation. Patchy areas of tree-in-bud opacity in the left upper lobe and left lower lobe suggesting chronic inflammation or possibly atypical infection such as MAC. Endobronchial debris in  the right lower lobe along with peribronchial thickening may suggest aspiration. No right lower lobe pneumonia. Patchy atelectasis or scarring change noted in the superior segment of the left lower lobe. Musculoskeletal: Fairly marked cachexia. No chest wall mass or axillary  adenopathy. The bony structures are intact.  No bone lesions are identified. CT ABDOMEN PELVIS FINDINGS Hepatobiliary: Numerous hepatic cysts. No worrisome hepatic lesions or intrahepatic biliary dilatation. Pancreas: No mass, inflammation or ductal dilatation. Moderate pancreatic atrophy. Spleen: Normal size.  No worrisome lesions. Adrenals/Urinary Tract: The adrenal glands and kidneys are unremarkable. A right renal cyst is noted with some thin peripheral calcifications. No hydronephrosis. The bladder is grossly normal. Stomach/Bowel: Surgical changes noted involving the stomach but I do not see any findings suspicious for recurrent mass. The small bowel and colon are grossly normal. Vascular/Lymphatic: Marked tortuosity and mild ectasia of the abdominal aorta with moderate atherosclerotic calcifications but no focal aneurysm or dissection. The branch vessels are patent. There is marked tortuosity, ectasia and calcification of the iliac arteries. 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 No obvious mesenteric or retroperitoneal mass or adenopathy. Reproductive: The prostate gland and seminal vesicles are grossly normal. Other: No pelvic mass or free pelvic fluid collections. Musculoskeletal: Scoliosis and advanced degenerative lumbar spondylosis but no worrisome bone lesions. IMPRESSION: 1. Worrisome right upper lobe pulmonary lesion measuring 2.5 x 1.4 x 1.3 cm. PET-CT may be helpful for further evaluation. No mediastinal or hilar lymphadenopathy or evidence of metastatic disease elsewhere. 2. Patchy areas of tree-in-bud appearance suggesting chronic inflammation or atypical infection such as MAC. 3.  Moderate amount of endobronchial debris in the right lower lobe along with peribronchial thickening possibly due to aspiration but no definite findings for pneumonia. 4. Numerous hepatic cysts but no worrisome hepatic lesions. There is also a complex upper pole right renal cyst with areas of peripheral calcification but no solid enhancing components. 5. Advanced vascular disease. 6. Surgical changes from gastric surgery without complicating features. I do not see any definite findings for recurrent gastric mass. Electronically Signed   By: Marijo Sanes M.D.   On: 05/08/2019 08:51     ASSESSMENT AND PLAN:   85 year old male with history of gastric cancer and two thirds of stomach removed who presented from GI office as a direct admit due to dysphasia and weight loss.  1.  Severe malnutrition with severe cachexia, weight loss and dysphasia: Patient plan for Dobbhoff and possible endoscopic feeding tube pending CT scan and anatomy due to previous surgeries. Dr. Alice Reichert to see patient today. After Dobbhoff tube placed then plan to start nutrition as per dietary.   Patient is currently followed by outpatient Palliative at home   Management plans discussed with the patient and he is in agreement.  CODE STATUS: FULL  TOTAL TIME TAKING CARE OF THIS PATIENT: 30 minutes.     POSSIBLE D/C 1-2 days, DEPENDING ON CLINICAL CONDITION.   Bettey Costa M.D on 05/08/2019 at 10:50 AM  Between 7am to 6pm - Pager - [phone] After 6pm go to [email or web address] - password [email or web address] [facility]  Office  [phone]  CC: Primary care physician; Olin Hauser, DO  Note: This dictation was prepared with Dragon dictation along with smaller phrase technology. Any transcriptional errors that result from this process are unintentional.

## 2019-05-08 NOTE — Plan of Care (Signed)

## 2019-05-08 NOTE — Consult Note (Signed)
[facility] GI Inpatient Consult Note   Kathline Magic, M.D.  Reason for Consult: Feeding problem, dysphagia, abnormal swallowing study.   Attending Requesting Consult: Bettey Costa, M.D.   History of Present Illness: Brian Ferguson is a 85 y.o. male presents after hospital admission for dysphagia, malnutrition, dehydration weight loss.  Patient was seen yesterday by Dr. Bonna Gains of [facility] and sent for elective admission and asked that I see the patient.  Patient was seen today and complains of weakness, dysphagia to liquids and solids but does claim that he is very hungry.  He has lost over 50 pounds in the last 3 months.  He has a remote history of gastric cancer diagnosed in 1987 and underwent 2 surgeries.  He is undergone laparotomies as well for partial small bowel obstructions.  His last colonoscopy was over 10 years ago and was "normal". The patient underwent CT scan of the abdomen showing no obvious recurrence of gastric cancer.  I spoke with the radiologist, Dr. Candise Che, who says that the patient had still has retained approximately one third of his stomach including the cardia and fundus and part possibly part of the body of the stomach.  There was some gastric wall thickening without obvious tumor involvement.  Past Medical History:  Past Medical History:  Diagnosis Date   Cancer ([place])    stomach   Glaucoma    History of blood clots    Malabsorption syndrome     Problem List: Patient Active Problem List   Diagnosis Date Noted   Cachexia ([place]) 04/17/2019   History of cancer of stomach 03/18/2019   Severe protein-calorie malnutrition ([place]) 03/18/2019   Recurrent falls 03/18/2019   Generalized weakness 03/18/2019    Past Surgical History: Past Surgical History:  Procedure Laterality Date   HERNIA REPAIR     STOMACH SURGERY      Allergies: No Known Allergies  Home Medications: No medications prior to admission.   Home  medication reconciliation was completed with the patient.   Scheduled Inpatient Medications:    docusate sodium  100 mg Oral BID   heparin  5,000 Units Subcutaneous Q8H    Continuous Inpatient Infusions:    sodium chloride 75 mL/hr at 05/08/19 0702    PRN Inpatient Medications:  acetaminophen **OR** acetaminophen, bisacodyl, iohexol, ondansetron **OR** ondansetron (ZOFRAN) IV, traZODone  Family History: family history is not on file.   GI Family History: Negative  Social History:   reports that he has quit smoking. He has quit using smokeless tobacco. He reports previous alcohol use. He reports that he does not use drugs. The patient denies ETOH, tobacco, or drug use.    Review of Systems: Review of Systems - Negative except HPI  Physical Examination: BP 135/89    Pulse 77    Temp 98.3 F (36.8 C) (Oral)    Resp 20    Ht _0  (1.778 m)    Wt 48 kg    SpO2 98%    BMI 15.18 kg/m  Physical Exam Vitals signs reviewed.  Constitutional:      General: He is not in acute distress.    Appearance: Normal appearance. He is ill-appearing. He is not diaphoretic.  HENT:     Head: Normocephalic and atraumatic.  Eyes:     Conjunctiva/sclera: Conjunctivae normal.     Pupils: Pupils are equal, round, and reactive to light.  Cardiovascular:     Rate and Rhythm: Normal rate.     Pulses: Normal pulses.  Pulmonary:     Effort: Pulmonary effort is normal.     Breath sounds: Normal breath sounds.  Abdominal:     General: Abdomen is flat.     Palpations: Abdomen is soft.       Comments: Scaphoid, multiple scars  Musculoskeletal: Normal range of motion.  Skin:    General: Skin is warm and dry.  Neurological:     Mental Status: He is alert.     Data: Lab Results  Component Value Date   WBC 6.7 05/08/2019   HGB 11.9 (L) 05/08/2019   HCT 38.4 (L) 05/08/2019   MCV 96.0 05/08/2019   PLT 177 05/08/2019   Recent Labs  Lab 04/18/2019 1416 05/08/19 0251  HGB 12.4* 11.9*    Lab Results  Component Value Date   NA 142 05/08/2019   K 4.0 05/08/2019   CL 109 05/08/2019   CO2 27 05/08/2019   BUN 32 (H) 05/08/2019   CREATININE 0.60 (L) 05/08/2019   Lab Results  Component Value Date   ALT 17 05/06/2019   AST 22 05/06/2019   ALKPHOS 62 05/06/2019   BILITOT 0.8 04/22/2019   No results for input(s): APTT, INR, PTT in the last 168 hours. CBC Latest Ref Rng & Units 05/08/2019 04/27/2019 03/18/2019  WBC 4.0 - 10.5 K/uL 6.7 8.1 9.4  Hemoglobin 13.0 - 17.0 g/dL 11.9(L) 12.4(L) 12.7(L)  Hematocrit 39.0 - 52.0 % 38.4(L) 39.8 40.4  Platelets 150 - 400 K/uL 177 194 200    STUDIES: Ct Chest W Contrast  Result Date: 05/08/2019 CLINICAL DATA:  Remote history of gastric cancer. Patient is having progressive weight loss. Symptoms of dysphagia. EXAM: CT CHEST, ABDOMEN, AND PELVIS WITH CONTRAST TECHNIQUE: Multidetector CT imaging of the chest, abdomen and pelvis was performed following the standard protocol during bolus administration of intravenous contrast. CONTRAST:  81m OMNIPAQUE IOHEXOL 300 MG/ML  SOLN COMPARISON:  None. FINDINGS: CT CHEST FINDINGS Cardiovascular: The heart is normal in size. There is age-related tortuosity, ectasia and calcification of the thoracic aorta but no dissection or focal aneurysm. The branch vessels are patent. Moderate three-vessel coronary artery calcifications are noted. Mediastinum/Nodes: No mediastinal or hilar mass or lymphadenopathy. The esophagus is grossly normal. Lungs/Pleura: There is a 2.5 x 1.4 x 1.3 cm lesion in the right upper lobe suspicious for neoplasm but possibly scar tissue. Without prior studies for comparison PET-CT may be helpful for further evaluation. Patchy areas of tree-in-bud opacity in the left upper lobe and left lower lobe suggesting chronic inflammation or possibly atypical infection such as MAC. Endobronchial debris in the right lower lobe along with peribronchial thickening may suggest aspiration. No right lower lobe  pneumonia. Patchy atelectasis or scarring change noted in the superior segment of the left lower lobe. Musculoskeletal: Fairly marked cachexia. No chest wall mass or axillary adenopathy. The bony structures are intact.  No bone lesions are identified. CT ABDOMEN PELVIS FINDINGS Hepatobiliary: Numerous hepatic cysts. No worrisome hepatic lesions or intrahepatic biliary dilatation. Pancreas: No mass, inflammation or ductal dilatation. Moderate pancreatic atrophy. Spleen: Normal size.  No worrisome lesions. Adrenals/Urinary Tract: The adrenal glands and kidneys are unremarkable. A right renal cyst is noted with some thin peripheral calcifications. No hydronephrosis. The bladder is grossly normal. Stomach/Bowel: Surgical changes noted involving the stomach but I do not see any findings suspicious for recurrent mass. The small bowel and colon are grossly normal. Vascular/Lymphatic: Marked tortuosity and mild ectasia of the abdominal aorta with moderate atherosclerotic calcifications but no focal aneurysm or  dissection. The branch vessels are patent. There is marked tortuosity, ectasia and calcification of the iliac arteries. 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 No obvious mesenteric or retroperitoneal mass or adenopathy. Reproductive: The prostate gland and seminal vesicles are grossly normal. Other: No pelvic mass or free pelvic fluid collections. Musculoskeletal: Scoliosis and advanced degenerative lumbar spondylosis but no worrisome bone lesions. IMPRESSION: 1. Worrisome right upper lobe pulmonary lesion measuring 2.5 x 1.4 x 1.3 cm. PET-CT may be helpful for further evaluation. No mediastinal or hilar lymphadenopathy or evidence of metastatic disease elsewhere. 2. Patchy areas of tree-in-bud appearance suggesting chronic inflammation or atypical infection such as MAC. 3. Moderate amount of endobronchial debris in the right lower lobe along with peribronchial thickening  possibly due to aspiration but no definite findings for pneumonia. 4. Numerous hepatic cysts but no worrisome hepatic lesions. There is also a complex upper pole right renal cyst with areas of peripheral calcification but no solid enhancing components. 5. Advanced vascular disease. 6. Surgical changes from gastric surgery without complicating features. I do not see any definite findings for recurrent gastric mass. Electronically Signed   By: Marijo Sanes M.D.   On: 05/08/2019 08:51   Ct Abdomen Pelvis W Contrast  Result Date: 05/08/2019 CLINICAL DATA:  Remote history of gastric cancer. Patient is having progressive weight loss. Symptoms of dysphagia. EXAM: CT CHEST, ABDOMEN, AND PELVIS WITH CONTRAST TECHNIQUE: Multidetector CT imaging of the chest, abdomen and pelvis was performed following the standard protocol during bolus administration of intravenous contrast. CONTRAST:  9m OMNIPAQUE IOHEXOL 300 MG/ML  SOLN COMPARISON:  None. FINDINGS: CT CHEST FINDINGS Cardiovascular: The heart is normal in size. There is age-related tortuosity, ectasia and calcification of the thoracic aorta but no dissection or focal aneurysm. The branch vessels are patent. Moderate three-vessel coronary artery calcifications are noted. Mediastinum/Nodes: No mediastinal or hilar mass or lymphadenopathy. The esophagus is grossly normal. Lungs/Pleura: There is a 2.5 x 1.4 x 1.3 cm lesion in the right upper lobe suspicious for neoplasm but possibly scar tissue. Without prior studies for comparison PET-CT may be helpful for further evaluation. Patchy areas of tree-in-bud opacity in the left upper lobe and left lower lobe suggesting chronic inflammation or possibly atypical infection such as MAC. Endobronchial debris in the right lower lobe along with peribronchial thickening may suggest aspiration. No right lower lobe pneumonia. Patchy atelectasis or scarring change noted in the superior segment of the left lower lobe. Musculoskeletal:  Fairly marked cachexia. No chest wall mass or axillary adenopathy. The bony structures are intact.  No bone lesions are identified. CT ABDOMEN PELVIS FINDINGS Hepatobiliary: Numerous hepatic cysts. No worrisome hepatic lesions or intrahepatic biliary dilatation. Pancreas: No mass, inflammation or ductal dilatation. Moderate pancreatic atrophy. Spleen: Normal size.  No worrisome lesions. Adrenals/Urinary Tract: The adrenal glands and kidneys are unremarkable. A right renal cyst is noted with some thin peripheral calcifications. No hydronephrosis. The bladder is grossly normal. Stomach/Bowel: Surgical changes noted involving the stomach but I do not see any findings suspicious for recurrent mass. The small bowel and colon are grossly normal. Vascular/Lymphatic: Marked tortuosity and mild ectasia of the abdominal aorta with moderate atherosclerotic calcifications but no focal aneurysm or dissection. The branch vessels are patent. There is marked tortuosity, ectasia and calcification of the iliac arteries. 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 No obvious mesenteric or retroperitoneal mass or adenopathy. Reproductive: The prostate gland and seminal vesicles are grossly normal.  Other: No pelvic mass or free pelvic fluid collections. Musculoskeletal: Scoliosis and advanced degenerative lumbar spondylosis but no worrisome bone lesions. IMPRESSION: 1. Worrisome right upper lobe pulmonary lesion measuring 2.5 x 1.4 x 1.3 cm. PET-CT may be helpful for further evaluation. No mediastinal or hilar lymphadenopathy or evidence of metastatic disease elsewhere. 2. Patchy areas of tree-in-bud appearance suggesting chronic inflammation or atypical infection such as MAC. 3. Moderate amount of endobronchial debris in the right lower lobe along with peribronchial thickening possibly due to aspiration but no definite findings for pneumonia. 4. Numerous hepatic cysts but no worrisome hepatic  lesions. There is also a complex upper pole right renal cyst with areas of peripheral calcification but no solid enhancing components. 5. Advanced vascular disease. 6. Surgical changes from gastric surgery without complicating features. I do not see any definite findings for recurrent gastric mass. Electronically Signed   By: Marijo Sanes M.D.   On: 05/08/2019 08:51   _0 @  Assessment: 1. Oropharyngeal dysphagia - LIkely CNS etiology.   2. Hx of gastric cancer - No obvious bowel obstruction or recurrent CA on CT scan of abdomen/pelvis.     COVID-19 status: Tested negative  Recommendations:  1. IV hydratrion, NG tube feeding temporarily.  2. EGD/PEG when cilinicaly feasible. I discussed the EGD and PEG placement with the patient and his daughter, Brian Ferguson, Durable power of attorney for the patient. The patient understands the nature of the planned procedure, indications, risks, alternatives and potential complications including but not limited to bleeding, infection, perforation, damage to internal organs and possible oversedation/side effects from anesthesia. The patient and Brian Ferguson agree and give  consent to proceed. Phone consent obtained from Bing Neighbors and witnessed by Freddie Breech, RN on the hospital floor.  Please refer to procedure notes for findings, recommendations and patient disposition/instructions.  Thank you for the consult. Please call with questions or concerns.  Olean Ree, "Lanny Hurst MD [facility] [place], [place] [address] [phone]  05/08/2019 12:14 PM

## 2019-05-08 NOTE — Progress Notes (Signed)
   05/08/19 0900  Clinical Encounter Type  Visited With Patient  Visit Type Initial  Spiritual Encounters  Spiritual Needs Emotional  Stress Factors  Patient Stress Factors Health changes  Ch was rounding. Pt hasn't eaten since yesterday and is waiting for the feeding tube to be placed. Pt told the ch about his family about his 4 children and wife who passed in 2007. Pt repeatedly asked for a nurse with whom he developed a connection. Ch offered a listening ear and talked to the nurse to identify pt's needs. Next follow up will include a talk about his concern around feeding tube and any worries he might have.

## 2019-05-09 ENCOUNTER — Encounter: Payer: Self-pay | Admitting: Anesthesiology

## 2019-05-09 ENCOUNTER — Encounter: Admission: AD | Disposition: E | Payer: Self-pay | Source: Ambulatory Visit | Attending: Internal Medicine

## 2019-05-09 ENCOUNTER — Telehealth: Payer: Self-pay

## 2019-05-09 ENCOUNTER — Inpatient Hospital Stay: Payer: Medicare Other

## 2019-05-09 ENCOUNTER — Inpatient Hospital Stay: Payer: Medicare Other | Admitting: Certified Registered"

## 2019-05-09 HISTORY — PX: PEG PLACEMENT: SHX5437

## 2019-05-09 LAB — GLUCOSE, CAPILLARY
Glucose-Capillary: 128 mg/dL — ABNORMAL HIGH (ref 70–99)
Glucose-Capillary: 53 mg/dL — ABNORMAL LOW (ref 70–99)
Glucose-Capillary: 53 mg/dL — ABNORMAL LOW (ref 70–99)

## 2019-05-09 SURGERY — INSERTION, PEG TUBE
Anesthesia: General

## 2019-05-09 MED ORDER — DEXTROSE 50 % IV SOLN
25.0000 g | Freq: Once | INTRAVENOUS | Status: AC
  Administered 2019-05-09: 08:00:00 25 g via INTRAVENOUS
  Filled 2019-05-09: qty 50

## 2019-05-09 MED ORDER — PROPOFOL 10 MG/ML IV BOLUS
INTRAVENOUS | Status: DC | PRN
  Administered 2019-05-09 (×2): 10 mg via INTRAVENOUS
  Administered 2019-05-09: 20 mg via INTRAVENOUS
  Administered 2019-05-09: 10 mg via INTRAVENOUS

## 2019-05-09 MED ORDER — OSMOLITE 1.5 CAL PO LIQD
1000.0000 mL | ORAL | Status: AC
  Administered 2019-05-09: 480 mL
  Administered 2019-05-12 – 2019-05-13 (×3): 1000 mL

## 2019-05-09 MED ORDER — FREE WATER
30.0000 mL | Status: DC
  Administered 2019-05-09 – 2019-05-13 (×11): 30 mL

## 2019-05-09 MED ORDER — PRO-STAT SUGAR FREE PO LIQD
30.0000 mL | Freq: Every day | ORAL | Status: AC
  Administered 2019-05-13: 30 mL

## 2019-05-09 NOTE — Progress Notes (Addendum)
Nutrition Follow Up Note   DOCUMENTATION CODES:   Underweight  INTERVENTION:   Once Dobbhoff tube placed and confirmed, recommend initiating Osmolite 1.5 Cal at 15 mL/hr and advancing by 15 mL/hr every 12 hours to goal rate of 45 mL/hr. Also provide Pro-Stat 30 mL once daily per tube. Goal regimen provides 1720 kcal, 83 grams of protein, 821 mL H2O daily.  While on IV fluids provide a minimum free water flush of 30 mL Q4hrs to maintain tube patency. If plan is to transition to meeting fluid needs with enteral regimen, recommend a free water flush of 120 mL Q4hrs. Provides 1541 mL H2O daily including water in TF regimen.  If patient does not tolerate Osmolite can consider using a peptide-based formula (Vital).   Provide liquid MVI daily per tube.  Monitor magnesium, potassium, and phosphorus daily for at least 3 days, MD to replete as needed, as pt is at risk for refeeding syndrome.  NUTRITION DIAGNOSIS:   Inadequate oral intake related to inability to eat as evidenced by NPO status.  GOAL:   Patient will meet greater than or equal to 90% of their needs  -progressing with feeding tube placement   MONITOR:   Labs, Weight trends, TF tolerance, I & O's  ASSESSMENT:   85 year old male with PMHx of gastric cancer in 1987 s/p removal of 2/3 of stomach, hx resection of some bowel in 1989 (unsure of reason removed), glaucoma, malabsorption syndrome admitted from GI clinic for dysphagia, weight loss with cachexia.   Pt unable to have PEG tube placed today as gastric remnant was located in the chest. Plan is for post pyloric nasogastric tube to be placed by radiology today and initiate tube feeds. Pt is at high refeed risk; monitor electrolytes daily.   Medications reviewed and include:colace, heparin  Labs reviewed: BUN 32(H), creat 0.60(L)  Diet Order:   Diet Order            Diet NPO time specified  Diet effective now             EDUCATION NEEDS:   No education needs  have been identified at this time  Skin:  Skin Assessment: Reviewed RN Assessment  Last BM:  Unknown  Height:   Ht Readings from Last 1 Encounters:  05/13/2019 5\' 10"  (1.778 m)   Weight:   Wt Readings from Last 1 Encounters:  05/16/2019 48.9 kg   Ideal Body Weight:  75.5 kg  BMI:  Body mass index is 15.45 kg/m.  Estimated Nutritional Needs:   Kcal:  1500-1700  Protein:  72-86 grams  Fluid:  1.5 L/day  Koleen Distance MS, RD, LDN Pager #- [phone] Office#- [phone] After Hours Pager: [phone]

## 2019-05-09 NOTE — Anesthesia Post-op Follow-up Note (Signed)
Anesthesia QCDR form completed.        

## 2019-05-09 NOTE — Transfer of Care (Signed)
Immediate Anesthesia Transfer of Care Note  Patient: Brian Ferguson  Procedure(s) Performed: PERCUTANEOUS ENDOSCOPIC GASTROSTOMY (PEG) PLACEMENT (N/A )  Patient Location: Endoscopy Unit  Anesthesia Type:General  Level of Consciousness: drowsy and responds to stimulation  Airway & Oxygen Therapy: Patient Spontanous Breathing and Patient connected to face mask oxygen  Post-op Assessment: Report given to RN and Post -op Vital signs reviewed and stable  Post vital signs: Reviewed and stable  Last Vitals:  Vitals Value Taken Time  BP    Temp    Pulse 80 05/08/2019 1217  Resp 16 05/03/2019 1217  SpO2 100 % 05/01/2019 1217  Vitals shown include unvalidated device data.  Last Pain:  Vitals:   05/08/2019 1214  TempSrc: Tympanic  PainSc: Asleep         Complications: No apparent anesthesia complications

## 2019-05-09 NOTE — Progress Notes (Signed)
Pt pulled dobhoff tube getting out of bed. Feeding stopped.  Dr Jannifer Franklin notified.  Will day shift MD address in am.

## 2019-05-09 NOTE — Anesthesia Postprocedure Evaluation (Signed)
Anesthesia Post Note  Patient: Brian Ferguson  Procedure(s) Performed: PERCUTANEOUS ENDOSCOPIC GASTROSTOMY (PEG) PLACEMENT (N/A )  Patient location during evaluation: Endoscopy Anesthesia Type: General Level of consciousness: awake and alert Pain management: pain level controlled Vital Signs Assessment: post-procedure vital signs reviewed and stable Respiratory status: spontaneous breathing, nonlabored ventilation, respiratory function stable and patient connected to nasal cannula oxygen Cardiovascular status: blood pressure returned to baseline and stable Postop Assessment: no apparent nausea or vomiting Anesthetic complications: no     Last Vitals:  Vitals:   04/25/2019 1244 05/07/2019 1306  BP: 136/84 (!) 141/85  Pulse: 83 74  Resp: 17 17  Temp:  (!) 36.4 C  SpO2: 96% 97%    Last Pain:  Vitals:   05/06/2019 1306  TempSrc: Oral  PainSc:                  Precious Haws [place]

## 2019-05-09 NOTE — Progress Notes (Addendum)
[facility] NAME: Brian Ferguson    MR#:  [id number]  DATE OF BIRTH:  02-18-1939  SUBJECTIVE:   Patient plan for PEG tube today. REVIEW OF SYSTEMS:    Review of Systems  Constitutional: Negative for fever, chills ++ poor appetite with weight loss HENT: Negative for ear pain, nosebleeds, congestion, facial swelling, rhinorrhea, neck pain, neck stiffness and ear discharge.   Respiratory: Negative for cough, shortness of breath, wheezing  Cardiovascular: Negative for chest pain, palpitations and leg swelling.  Gastrointestinal: Negative for heartburn, abdominal pain, vomiting, diarrhea or consitpation Genitourinary: Negative for dysuria, urgency, frequency, hematuria Musculoskeletal: Negative for back pain or joint pain Neurological: Negative for dizziness, seizures, syncope, focal weakness,  numbness and headaches.  Hematological: Does not bruise/bleed easily.  Psychiatric/Behavioral: Negative for hallucinations, confusion, dysphoric mood    Tolerating Diet: npo      DRUG ALLERGIES:  No Known Allergies  VITALS:  Blood pressure 133/86, pulse 69, temperature 97.8 F (36.6 C), temperature source Oral, resp. rate 18, height _0  (1.778 m), weight 48.9 kg, SpO2 97 %.  PHYSICAL EXAMINATION:  Constitutional: Appears frail thin cachectic no distress. HENT: Normocephalic. Marland Kitchen Oropharynx is clear and moist.  Eyes: Conjunctivae and EOM are normal. PERRLA, no scleral icterus.  Neck: Normal ROM. Neck supple. No JVD. No tracheal deviation. CVS: RRR, S1/S2 +, no murmurs, no gallops, no carotid bruit.  Pulmonary: Effort and breath sounds normal, no stridor, rhonchi, wheezes, rales.  Abdominal: Soft. BS +,  no distension, tenderness, rebound or guarding.  Musculoskeletal: Normal range of motion. No edema and no tenderness.  Neuro: Alert. CN 2-12 grossly intact. No focal deficits. Skin: Skin is warm and dry. No rash noted. Psychiatric:  Normal mood and affect.      LABORATORY PANEL:   CBC Recent Labs  Lab 05/08/19 0251  WBC 6.7  HGB 11.9*  HCT 38.4*  PLT 177   ------------------------------------------------------------------------------------------------------------------  Chemistries  Recent Labs  Lab 04/29/2019 1416 05/08/19 0251  NA 142 142  K 4.1 4.0  CL 105 109  CO2 29 27  GLUCOSE 104* 92  BUN 35* 32*  CREATININE 0.74 0.60*  CALCIUM 9.3 8.6*  AST 22  --   ALT 17  --   ALKPHOS 62  --   BILITOT 0.8  --    ------------------------------------------------------------------------------------------------------------------  Cardiac Enzymes No results for input(s): TROPONINI in the last 168 hours. ------------------------------------------------------------------------------------------------------------------  RADIOLOGY:  Ct Chest W Contrast  Result Date: 05/08/2019 CLINICAL DATA:  Remote history of gastric cancer. Patient is having progressive weight loss. Symptoms of dysphagia. EXAM: CT CHEST, ABDOMEN, AND PELVIS WITH CONTRAST TECHNIQUE: Multidetector CT imaging of the chest, abdomen and pelvis was performed following the standard protocol during bolus administration of intravenous contrast. CONTRAST:  16m OMNIPAQUE IOHEXOL 300 MG/ML  SOLN COMPARISON:  None. FINDINGS: CT CHEST FINDINGS Cardiovascular: The heart is normal in size. There is age-related tortuosity, ectasia and calcification of the thoracic aorta but no dissection or focal aneurysm. The branch vessels are patent. Moderate three-vessel coronary artery calcifications are noted. Mediastinum/Nodes: No mediastinal or hilar mass or lymphadenopathy. The esophagus is grossly normal. Lungs/Pleura: There is a 2.5 x 1.4 x 1.3 cm lesion in the right upper lobe suspicious for neoplasm but possibly scar tissue. Without prior studies for comparison PET-CT may be helpful for further evaluation. Patchy areas of tree-in-bud opacity in the left upper lobe and  left lower lobe suggesting chronic inflammation or possibly atypical infection  such as MAC. Endobronchial debris in the right lower lobe along with peribronchial thickening may suggest aspiration. No right lower lobe pneumonia. Patchy atelectasis or scarring change noted in the superior segment of the left lower lobe. Musculoskeletal: Fairly marked cachexia. No chest wall mass or axillary adenopathy. The bony structures are intact.  No bone lesions are identified. CT ABDOMEN PELVIS FINDINGS Hepatobiliary: Numerous hepatic cysts. No worrisome hepatic lesions or intrahepatic biliary dilatation. Pancreas: No mass, inflammation or ductal dilatation. Moderate pancreatic atrophy. Spleen: Normal size.  No worrisome lesions. Adrenals/Urinary Tract: The adrenal glands and kidneys are unremarkable. A right renal cyst is noted with some thin peripheral calcifications. No hydronephrosis. The bladder is grossly normal. Stomach/Bowel: Surgical changes noted involving the stomach but I do not see any findings suspicious for recurrent mass. The small bowel and colon are grossly normal. Vascular/Lymphatic: Marked tortuosity and mild ectasia of the abdominal aorta with moderate atherosclerotic calcifications but no focal aneurysm or dissection. The branch vessels are patent. There is marked tortuosity, ectasia and calcification of the iliac arteries. 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 No obvious mesenteric or retroperitoneal mass or adenopathy. Reproductive: The prostate gland and seminal vesicles are grossly normal. Other: No pelvic mass or free pelvic fluid collections. Musculoskeletal: Scoliosis and advanced degenerative lumbar spondylosis but no worrisome bone lesions. IMPRESSION: 1. Worrisome right upper lobe pulmonary lesion measuring 2.5 x 1.4 x 1.3 cm. PET-CT may be helpful for further evaluation. No mediastinal or hilar lymphadenopathy or evidence of metastatic disease  elsewhere. 2. Patchy areas of tree-in-bud appearance suggesting chronic inflammation or atypical infection such as MAC. 3. Moderate amount of endobronchial debris in the right lower lobe along with peribronchial thickening possibly due to aspiration but no definite findings for pneumonia. 4. Numerous hepatic cysts but no worrisome hepatic lesions. There is also a complex upper pole right renal cyst with areas of peripheral calcification but no solid enhancing components. 5. Advanced vascular disease. 6. Surgical changes from gastric surgery without complicating features. I do not see any definite findings for recurrent gastric mass. Electronically Signed   By: Marijo Sanes M.D.   On: 05/08/2019 08:51   Ct Abdomen Pelvis W Contrast  Result Date: 05/08/2019 CLINICAL DATA:  Remote history of gastric cancer. Patient is having progressive weight loss. Symptoms of dysphagia. EXAM: CT CHEST, ABDOMEN, AND PELVIS WITH CONTRAST TECHNIQUE: Multidetector CT imaging of the chest, abdomen and pelvis was performed following the standard protocol during bolus administration of intravenous contrast. CONTRAST:  42m OMNIPAQUE IOHEXOL 300 MG/ML  SOLN COMPARISON:  None. FINDINGS: CT CHEST FINDINGS Cardiovascular: The heart is normal in size. There is age-related tortuosity, ectasia and calcification of the thoracic aorta but no dissection or focal aneurysm. The branch vessels are patent. Moderate three-vessel coronary artery calcifications are noted. Mediastinum/Nodes: No mediastinal or hilar mass or lymphadenopathy. The esophagus is grossly normal. Lungs/Pleura: There is a 2.5 x 1.4 x 1.3 cm lesion in the right upper lobe suspicious for neoplasm but possibly scar tissue. Without prior studies for comparison PET-CT may be helpful for further evaluation. Patchy areas of tree-in-bud opacity in the left upper lobe and left lower lobe suggesting chronic inflammation or possibly atypical infection such as MAC. Endobronchial debris in  the right lower lobe along with peribronchial thickening may suggest aspiration. No right lower lobe pneumonia. Patchy atelectasis or scarring change noted in the superior segment of the left lower lobe. Musculoskeletal: Fairly marked cachexia. No chest wall mass or axillary  adenopathy. The bony structures are intact.  No bone lesions are identified. CT ABDOMEN PELVIS FINDINGS Hepatobiliary: Numerous hepatic cysts. No worrisome hepatic lesions or intrahepatic biliary dilatation. Pancreas: No mass, inflammation or ductal dilatation. Moderate pancreatic atrophy. Spleen: Normal size.  No worrisome lesions. Adrenals/Urinary Tract: The adrenal glands and kidneys are unremarkable. A right renal cyst is noted with some thin peripheral calcifications. No hydronephrosis. The bladder is grossly normal. Stomach/Bowel: Surgical changes noted involving the stomach but I do not see any findings suspicious for recurrent mass. The small bowel and colon are grossly normal. Vascular/Lymphatic: Marked tortuosity and mild ectasia of the abdominal aorta with moderate atherosclerotic calcifications but no focal aneurysm or dissection. The branch vessels are patent. There is marked tortuosity, ectasia and calcification of the iliac arteries. 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 No obvious mesenteric or retroperitoneal mass or adenopathy. Reproductive: The prostate gland and seminal vesicles are grossly normal. Other: No pelvic mass or free pelvic fluid collections. Musculoskeletal: Scoliosis and advanced degenerative lumbar spondylosis but no worrisome bone lesions. IMPRESSION: 1. Worrisome right upper lobe pulmonary lesion measuring 2.5 x 1.4 x 1.3 cm. PET-CT may be helpful for further evaluation. No mediastinal or hilar lymphadenopathy or evidence of metastatic disease elsewhere. 2. Patchy areas of tree-in-bud appearance suggesting chronic inflammation or atypical infection such as MAC. 3.  Moderate amount of endobronchial debris in the right lower lobe along with peribronchial thickening possibly due to aspiration but no definite findings for pneumonia. 4. Numerous hepatic cysts but no worrisome hepatic lesions. There is also a complex upper pole right renal cyst with areas of peripheral calcification but no solid enhancing components. 5. Advanced vascular disease. 6. Surgical changes from gastric surgery without complicating features. I do not see any definite findings for recurrent gastric mass. Electronically Signed   By: Marijo Sanes M.D.   On: 05/08/2019 08:51     ASSESSMENT AND PLAN:   85 year old male with history of gastric cancer and two thirds of stomach removed who presented from GI office as a direct admit due to dysphasia and weight loss.  1.  Severe malnutrition with severe cachexia, weight loss and dysphasia: Patient plan for PEG tube today by Dr. Alice Reichert. Start feeds after PEG is placed per dietary recommendations. Will likely need to watch for refeeding syndrome  PT consult for discharge planning    Patient is currently followed by outpatient Palliative at home   Management plans discussed with the patient and he is in agreement.  CODE STATUS: FULL  TOTAL TIME TAKING CARE OF THIS PATIENT: 22 minutes.     POSSIBLE D/C 1-2 days, DEPENDING ON CLINICAL CONDITION.   Bettey Costa M.D on 04/24/2019 at 11:03 AM  Between 7am to 6pm - Pager - [phone] After 6pm go to [email or web address] - password [email or web address] [facility]  Office  [phone]  CC: Primary care physician; Olin Hauser, DO  Note: This dictation was prepared with Dragon dictation along with smaller phrase technology. Any transcriptional errors that result from this process are unintentional.

## 2019-05-09 NOTE — Care Management Important Message (Signed)
Important Message  Patient Details  Name: Brian Ferguson MRN: [id number] Date of Birth: 02-01-1929   Medicare Important Message Given:  Yes     Juliann Pulse A Jaceon Heiberger 04/21/2019, 11:29 AM

## 2019-05-09 NOTE — Interval H&P Note (Signed)
History and Physical Interval Note:  05/11/2019 11:38 AM  Brian Ferguson  has presented today for surgery, with the diagnosis of Severe malnutrition, dysphagia, unintentional weight loss.  The various methods of treatment have been discussed with the patient and family. After consideration of risks, benefits and other options for treatment, the patient has consented to  Procedure(s): PERCUTANEOUS ENDOSCOPIC GASTROSTOMY (PEG) PLACEMENT (N/A) as a surgical intervention.  The patient's history has been reviewed, patient examined, no change in status, stable for surgery.  I have reviewed the patient's chart and labs.  Questions were answered to the patient's satisfaction.     [place], [place]

## 2019-05-09 NOTE — Op Note (Signed)
[facility] Gastroenterology Patient Name: Brian Ferguson Procedure Date: 05/12/2019 10:19 AM MRN: [id number] Account #: [id number] Date of Birth: June 03, 1929 Admit Type: Outpatient Age: 85 Room: [facility] ENDO ROOM 2 Gender: Male Note Status: Finalized Procedure:            Upper GI endoscopy Indications:          Place PEG due to aspiration risk, Place PEG due to                        neurological disorder causing impaired swallowing Providers:            Benay Pike. Josephyne Tarter MD, MD Medicines:            Propofol per Anesthesia Complications:        No immediate complications. Procedure:            Pre-Anesthesia Assessment:                       - The risks and benefits of the procedure and the                        sedation options and risks were discussed with the                        patient. All questions were answered and informed                        consent was obtained.                       - Patient identification and proposed procedure were                        verified prior to the procedure by the nurse. The                        procedure was verified in the procedure room.                       - ASA Grade Assessment: III - A patient with severe                        systemic disease.                       - After reviewing the risks and benefits, the patient                        was deemed in satisfactory condition to undergo the                        procedure.                       After obtaining informed consent, the endoscope was                        passed under direct vision. Throughout the procedure,                        the patient's blood pressure, pulse, and oxygen  saturations were monitored continuously. The Endoscope                        was introduced through the mouth, and advanced to the                        jejunum. The upper GI endoscopy was accomplished                        without  difficulty. The patient tolerated the procedure                        well. Findings:      The examined esophagus was normal.      Evidence of a patent Billroth II gastrojejunostomy was found. The       gastrojejunal anastomosis was characterized by congestion and erythema.       This was traversed. The efferent limb was examined 20 cm from the       anastomosis and was characterized by healthy appearing mucosa. The       afferent limb was examined 2 cm from the anastomosis and was       characterized by healthy appearing mucosa. Placement of an externally       removable PEG with no T-fasteners was attempted but could not be       successfully completed. The needle/trocar was not passed.       Transillumination of Stomach was noted in the area of the left lower rib       cage, not accessible to an abdominal approach. The procedure was       terminated and PEG tube placement canceled.      The examined jejunum was normal. Impression:           - Normal esophagus.                       - Patent Billroth II gastrojejunostomy was found,                        characterized by congestion and erythema.                       - Normal examined jejunum.                       - An externally removable PEG placement was attempted                        but could not be successfully completed. The                        needle/trocar was not passed.                       - No specimens collected. Recommendation:       - Return patient to hospital ward for ongoing care.                       - I advise IR or General surgical placement of a                        jejunal feeding tube.                       -  The findings and recommendations were discussed with                        the patient and their family. Procedure Code(s):    --- Professional ---                       [phone], Esophagogastroduodenoscopy, flexible, transoral;                        diagnostic, including collection of  specimen(s) by                        brushing or washing, when performed (separate procedure) Diagnosis Code(s):    --- Professional ---                       Z43.1, Encounter for attention to gastrostomy                       R13.10, Dysphagia, unspecified                       R29.818, Other symptoms and signs involving the nervous                        system                       R63.3, Feeding difficulties                       Z98.0, Intestinal bypass and anastomosis status CPT copyright 2019 American Medical Association. All rights reserved. The codes documented in this report are preliminary and upon coder review may  be revised to meet current compliance requirements. Efrain Sella MD, MD 05/14/2019 12:21:23 PM This report has been signed electronically. Number of Addenda: 0 Note Initiated On: 04/17/2019 10:19 AM Estimated Blood Loss: Estimated blood loss: none.      [facility]

## 2019-05-09 NOTE — Progress Notes (Signed)
Hypoglycemic Event  CBG: 53  Treatment: 25gm D50% IV Symptoms: none  Follow-up CBG: Time:0826 CBG Result:128  Possible Reasons for Event: NPO. Dysphagia.  Comments/MD notified:Dr Mody notified.  No new orders. Awaiting PEG tube placement.    Brian Ferguson

## 2019-05-09 NOTE — Telephone Encounter (Signed)
Telephone call from patient's daughter [name].  Diane reports patient is having G tube placed at the hospital today.  Diane states her dad called and informed her that she would need to do feedings 4 times per day.  Daughter states she does not live in the home and isn't sure she can manage patient's care.  Patient's son Tarrance lives in the home however Mr Saleeby infomred her he doesn't want son to perform feedings.  RN provides support to daughter.  RN made recommendation for daughter to call hospital and talk with patient's discharge planner.  RN informed daughter to ask about rehab and home health when patient discharged from the hospital.  Daughter informed to call with questions or concerns.

## 2019-05-09 NOTE — Progress Notes (Signed)
PT Cancellation Note  Patient Details Name: Brian Ferguson MRN: [id number] DOB: September 07, 1929   Cancelled Treatment:    Reason Eval/Treat Not Completed: Patient at procedure or test/unavailable(Consult received and chart reviewed.  Patient currently off unit for NG placement (unable to complete PEG this date).  Will continue efforts at later time/date as medically appropriate and available.)   Yaroslav Gombos H. Owens Shark, PT, DPT, NCS 05/02/2019, 2:44 PM [phone]

## 2019-05-09 NOTE — Anesthesia Preprocedure Evaluation (Signed)
Anesthesia Evaluation  Patient identified by MRN, date of birth, ID band Patient awake and Patient confused    Reviewed: Allergy & Precautions, H&P , NPO status , Patient's Chart, lab work & pertinent test results  History of Anesthesia Complications Negative for: history of anesthetic complications  Airway Mallampati: III  TM Distance: <3 FB Neck ROM: limited    Dental  (+) Chipped   Pulmonary neg shortness of breath, former smoker,           Cardiovascular Exercise Tolerance: Good (-) angina(-) Past MI and (-) DOE negative cardio ROS       Neuro/Psych CVA negative psych ROS   GI/Hepatic negative GI ROS, Neg liver ROS,   Endo/Other  negative endocrine ROS  Renal/GU negative Renal ROS  negative genitourinary   Musculoskeletal   Abdominal   Peds  Hematology negative hematology ROS (+)   Anesthesia Other Findings Past Medical History: No date: Cancer ([place])     Comment:  stomach No date: Glaucoma No date: History of blood clots No date: Malabsorption syndrome  Past Surgical History: No date: HERNIA REPAIR No date: STOMACH SURGERY  BMI    Body Mass Index: 15.45 kg/m      Reproductive/Obstetrics negative OB ROS                             Anesthesia Physical Anesthesia Plan  ASA: III  Anesthesia Plan: General   Post-op Pain Management:    Induction: Intravenous  PONV Risk Score and Plan: Propofol infusion and TIVA  Airway Management Planned: Natural Airway and Nasal Cannula  Additional Equipment:   Intra-op Plan:   Post-operative Plan:   Informed Consent: I have reviewed the patients History and Physical, chart, labs and discussed the procedure including the risks, benefits and alternatives for the proposed anesthesia with the patient or authorized representative who has indicated his/her understanding and acceptance.     Dental Advisory Given  Plan Discussed  with: Anesthesiologist, CRNA and Surgeon  Anesthesia Plan Comments: (Phone consent from daughter AYOUB AREY at [phone]   Patient also consented for risks of anesthesia including but not limited to:  - adverse reactions to medications - risk of intubation if required - damage to teeth, lips or other oral mucosa - sore throat or hoarseness - Damage to heart, brain, lungs or loss of life  Patient voiced understanding.)        Anesthesia Quick Evaluation

## 2019-05-10 LAB — PHOSPHORUS: Phosphorus: 2.9 mg/dL (ref 2.5–4.6)

## 2019-05-10 LAB — GLUCOSE, CAPILLARY: Glucose-Capillary: 117 mg/dL — ABNORMAL HIGH (ref 70–99)

## 2019-05-10 LAB — MAGNESIUM: Magnesium: 2.2 mg/dL (ref 1.7–2.4)

## 2019-05-10 LAB — BASIC METABOLIC PANEL
Anion gap: 12 (ref 5–15)
BUN: 18 mg/dL (ref 8–23)
CO2: 21 mmol/L — ABNORMAL LOW (ref 22–32)
Calcium: 8.6 mg/dL — ABNORMAL LOW (ref 8.9–10.3)
Chloride: 104 mmol/L (ref 98–111)
Creatinine, Ser: 0.5 mg/dL — ABNORMAL LOW (ref 0.61–1.24)
GFR calc Af Amer: >60 mL/min (ref >60)
GFR calc non Af Amer: >60 mL/min (ref >60)
Glucose, Bld: 102 mg/dL — ABNORMAL HIGH (ref 70–99)
Potassium: 4.9 mmol/L (ref 3.5–5.1)
Sodium: 137 mmol/L (ref 135–145)

## 2019-05-10 MED ORDER — ORAL CARE MOUTH RINSE
15.0000 mL | Freq: Two times a day (BID) | OROMUCOSAL | Status: DC
  Administered 2019-05-10 – 2019-05-25 (×27): 15 mL via OROMUCOSAL

## 2019-05-10 NOTE — Evaluation (Signed)
Physical Therapy Evaluation Patient Details Name: Brian Ferguson MRN: [id number] DOB: 04/28/1929 Today's Date: 05/10/2019   History of Present Illness   85 y.o. male with a known history of gastric cancer in 1987 sent in by Dr. Bonna Gains for IV fluids, NG tube placement.  Patient had gastric cancer in 1987 and has been having progressive weight loss for last 6 to 7 months.  Pt had NG tube placed 7/24 (unable to get PEG) and he pulled it out later that day.  Clinical Impression  Pt was actually able to circumambulate the nurses' station (~200 ft) with walker, though he had forward leaning shuffling gait and needed almost constant assist to insure that walker did not get too far ahead of him.  He reports some pain in R forefoot, yet was able to utilize LE seemingly well for WBing despite abbreviated, choppy steps.  Pt showed good effort and overall was eager to show what he could do.  His vision issues necessitated more close guarding and direction with activity, but ultimately he did not have any LOBs (though he was unsteady and inconsistent t/o the effort) and showed ability to do prolonged ambulation.       Follow Up Recommendations Home health PT    Equipment Recommendations  None recommended by PT    Recommendations for Other Services       Precautions / Restrictions Precautions Precautions: Fall Restrictions Weight Bearing Restrictions: No      Mobility  Bed Mobility Overal bed mobility: Modified Independent             General bed mobility comments: Pt able to get to sitting and scoot to EOB w/o assist  Transfers Overall transfer level: Modified independent Equipment used: Rolling walker (2 wheeled)             General transfer comment: Pt able to rise to standing and maintain balance w/o assist, maintained poor, forward flexed posture t/o the effort  Ambulation/Gait Ambulation/Gait assistance: Independent Gait Distance (Feet): 200 Feet Assistive device: Rolling  walker (2 wheeled)       General Gait Details: Pt is able ambulate around the nurses' station, though he did need constant directional assist (vision issues) as he did veer toward (and into) obstacles during effort.  Also had issues with very stooped and though he did not have any LOBs he maintained forward lean and needed constant cuing to insure that walker didn't get too far ahead of him.  Pt's O2 ~90% with HR ~60 post effort.  Pt with some fatigue but ultimately did better than expected.    Stairs            Wheelchair Mobility    Modified Rankin (Stroke Patients Only)       Balance Overall balance assessment: Modified Independent(reliant on walker in standing)                                           Pertinent Vitals/Pain      Home Living Family/patient expects to be discharged to:: Private residence Living Arrangements: Children Available Help at Discharge: Family(reports son is home 90% of the time)   Home Access: Stairs to enter Entrance Stairs-Rails: Can reach both Entrance Stairs-Number of Steps: 3 Home Layout: One level Home Equipment: Espy - 2 wheels;Cane - single point      Prior Function Level of Independence: Independent with assistive device(s)(started  using walker in the last month)               Hand Dominance        Extremity/Trunk Assessment   Upper Extremity Assessment Upper Extremity Assessment: Generalized weakness;Overall [facility] for tasks assessed    Lower Extremity Assessment Lower Extremity Assessment: Generalized weakness;Overall WFL for tasks assessed       Communication   Communication: No difficulties  Cognition Arousal/Alertness: Awake/alert Behavior During Therapy: WFL for tasks assessed/performed Overall Cognitive Status: Within Functional Limits for tasks assessed                                 General Comments: Pt has pulled out IVs and NG tube, able to hold normal conversation  and was alter to situation, etc though still seemed to have some low grade confusion at times      General Comments      Exercises     Assessment/Plan    PT Assessment Patient needs continued PT services  PT Problem List Decreased activity tolerance;Decreased balance;Decreased strength;Decreased safety awareness;Decreased knowledge of use of DME       PT Treatment Interventions DME instruction;Gait training;Stair training;Functional mobility training;Therapeutic activities;Therapeutic exercise;Balance training;Patient/family education;Neuromuscular re-education    PT Goals (Current goals can be found in the Care Plan section)  Acute Rehab PT Goals Patient Stated Goal: get stronger and get back to exercising at home PT Goal Formulation: With patient Time For Goal Achievement: 05/24/19 Potential to Achieve Goals: Good    Frequency Min 2X/week   Barriers to discharge        Co-evaluation               AM-PAC PT "6 Clicks" Mobility  Outcome Measure Help needed turning from your back to your side while in a flat bed without using bedrails?: None Help needed moving from lying on your back to sitting on the side of a flat bed without using bedrails?: None Help needed moving to and from a bed to a chair (including a wheelchair)?: None Help needed standing up from a chair using your arms (e.g., wheelchair or bedside chair)?: None Help needed to walk in hospital room?: A Little Help needed climbing 3-5 steps with a railing? : A Little 6 Click Score: 22    End of Session Equipment Utilized During Treatment: Gait belt Activity Tolerance: Patient tolerated treatment well Patient left: with chair alarm set;with call bell/phone within reach Nurse Communication: Mobility status PT Visit Diagnosis: Muscle weakness (generalized) (M62.81);Difficulty in walking, not elsewhere classified (R26.2)    Time: 7253-6644 PT Time Calculation (min) (ACUTE ONLY): 25 min   Charges:   PT  Evaluation $PT Eval Low Complexity: 1 Low PT Treatments $Gait Training: 8-22 mins        Kreg Shropshire, DPT 05/10/2019, 10:32 AM

## 2019-05-10 NOTE — Plan of Care (Signed)
Pt confused at times. Forgetful.  Cooperative. Dobhoff NG tube removed by pt last night. NPO. IVF infusing.

## 2019-05-10 NOTE — Progress Notes (Signed)
[place] at [place] NAME: Brian Ferguson    MR#:  [id number]  DATE OF BIRTH:  August 02, 1929  SUBJECTIVE:   The patient has no complaints.  He pulled off  dobhoff tube last night. REVIEW OF SYSTEMS:    Review of Systems  Constitutional: Negative for fever, chills ++ poor appetite with weight loss HENT: Negative for ear pain, nosebleeds, congestion, facial swelling, rhinorrhea, neck pain, neck stiffness and ear discharge.   Respiratory: Negative for cough, shortness of breath, wheezing  Cardiovascular: Negative for chest pain, palpitations and leg swelling.  Gastrointestinal: Negative for heartburn, abdominal pain, vomiting, diarrhea or consitpation Genitourinary: Negative for dysuria, urgency, frequency, hematuria Musculoskeletal: Negative for back pain or joint pain Neurological: Negative for dizziness, seizures, syncope, focal weakness,  numbness and headaches.  Hematological: Does not bruise/bleed easily.  Psychiatric/Behavioral: Negative for hallucinations, confusion, dysphoric mood Tolerating Diet: npo DRUG ALLERGIES:  No Known Allergies  VITALS:  Blood pressure (!) 167/90, pulse 70, temperature 98 F (36.7 C), resp. rate 15, height 5\' 10"  (1.778 m), weight 48.9 kg, SpO2 99 %.  PHYSICAL EXAMINATION:  Constitutional: Appears frail thin cachectic no distress. HENT: Normocephalic. Marland Kitchen Oropharynx is clear and moist.  Eyes: Conjunctivae and EOM are normal. PERRLA, no scleral icterus.  Neck: Normal ROM. Neck supple. No JVD. No tracheal deviation. CVS: RRR, S1/S2 +, no murmurs, no gallops, no carotid bruit.  Pulmonary: Effort and breath sounds normal, no stridor, rhonchi, wheezes, rales.  Abdominal: Soft. BS +,  no distension, tenderness, rebound or guarding.  Musculoskeletal: Normal range of motion. No edema and no tenderness.  Neuro: Alert. CN 2-12 grossly intact. No focal deficits. Skin: Skin is warm and dry. No rash noted. Psychiatric:  Normal mood and affect.  LABORATORY PANEL:   CBC Recent Labs  Lab 05/08/19 0251  WBC 6.7  HGB 11.9*  HCT 38.4*  PLT 177   ------------------------------------------------------------------------------------------------------------------  Chemistries  Recent Labs  Lab 05/06/2019 1416  05/10/19 0436  NA 142   < > 137  K 4.1   < > 4.9  CL 105   < > 104  CO2 29   < > 21*  GLUCOSE 104*   < > 102*  BUN 35*   < > 18  CREATININE 0.74   < > 0.50*  CALCIUM 9.3   < > 8.6*  MG  --   --  2.2  AST 22  --   --   ALT 17  --   --   ALKPHOS 62  --   --   BILITOT 0.8  --   --    < > = values in this interval not displayed.   ------------------------------------------------------------------------------------------------------------------  Cardiac Enzymes No results for input(s): TROPONINI in the last 168 hours. ------------------------------------------------------------------------------------------------------------------  RADIOLOGY:  Dg Naso G Tube Plc W/fl W/rad  Result Date: 04/18/2019 CLINICAL DATA:  Feeding tube needed. EXAM: NASO G TUBE PLACEMENT WITH FL AND WITH RAD CONTRAST:  None. FLUOROSCOPY TIME:  Fluoroscopy Time:  4 minutes 42 seconds Radiation Exposure Index (if provided by the fluoroscopic device): 43.0 mGy COMPARISON:  CT 05/03/2019. FINDINGS: A feeding tube was placed through the left nares down the esophagus into the stomach under fluoroscopic guidance. There were no complications. IMPRESSION: Successful feeding tube placement. Electronically Signed   By: Marcello Moores  Register   On: 04/18/2019 14:56   ASSESSMENT AND PLAN:   85 year old male with history of gastric cancer and two thirds of stomach removed who presented  from GI office as a direct admit due to dysphasia and weight loss.  1.  Severe malnutrition with severe cachexia, weight loss and dysphasia: S/p  dobhoff tube placement but pulled off by patient. History of gastric cancer.  S/p surgery. PT consult; HHPT.   Patient is currently followed by outpatient Palliative at home. I discussed with the patient's daughter.  He will talk to his father about Dobbhoff tube placement again or hospice. Management plans discussed with the patient and he is in agreement.  CODE STATUS: FULL  TOTAL TIME TAKING CARE OF THIS PATIENT: 26 minutes. POSSIBLE D/C 2 days, DEPENDING ON CLINICAL CONDITION.  Demetrios Loll M.D on 05/10/2019 at 2:04 PM  Between 7am to 6pm - Pager - [phone] After 6pm go to [email or web address] - password [email or web address] [facility]  Office  [phone]  CC: Primary care physician; Olin Hauser, DO  Note: This dictation was prepared with Dragon dictation along with smaller phrase technology. Any transcriptional errors that result from this process are unintentional.

## 2019-05-11 NOTE — Progress Notes (Signed)
[place] at [place] NAME: Brian Ferguson    MR#:  [id number]  DATE OF BIRTH:  12/08/1928  SUBJECTIVE:   The patient is hungry. REVIEW OF SYSTEMS:    Review of Systems  Constitutional: Negative for fever, chills ++ poor appetite with weight loss HENT: Negative for ear pain, nosebleeds, congestion, facial swelling, rhinorrhea, neck pain, neck stiffness and ear discharge.   Respiratory: Negative for cough, shortness of breath, wheezing  Cardiovascular: Negative for chest pain, palpitations and leg swelling.  Gastrointestinal: Negative for heartburn, abdominal pain, vomiting, diarrhea or consitpation Genitourinary: Negative for dysuria, urgency, frequency, hematuria Musculoskeletal: Negative for back pain or joint pain Neurological: Negative for dizziness, seizures, syncope, focal weakness,  numbness and headaches.  Hematological: Does not bruise/bleed easily.  Psychiatric/Behavioral: Negative for hallucinations, confusion, dysphoric mood Tolerating Diet: npo DRUG ALLERGIES:  No Known Allergies  VITALS:  Blood pressure 112/65, pulse (!) 59, temperature 98.2 F (36.8 C), temperature source Oral, resp. rate 16, height 5\' 10"  (1.778 m), weight 48 kg, SpO2 99 %.  PHYSICAL EXAMINATION:  Constitutional: Appears frail thin cachectic no distress. HENT: Normocephalic. Marland Kitchen Oropharynx is clear and moist.  Eyes: Conjunctivae and EOM are normal. PERRLA, no scleral icterus.  Neck: Normal ROM. Neck supple. No JVD. No tracheal deviation. CVS: RRR, S1/S2 +, no murmurs, no gallops, no carotid bruit.  Pulmonary: Effort and breath sounds normal, no stridor, rhonchi, wheezes, rales.  Abdominal: Soft. BS +,  no distension, tenderness, rebound or guarding.  Musculoskeletal: Normal range of motion. No edema and no tenderness.  Neuro: Alert. CN 2-12 grossly intact. No focal deficits. Skin: Skin is warm and dry. No rash noted. Psychiatric: Normal mood and affect.   LABORATORY PANEL:   CBC Recent Labs  Lab 05/08/19 0251  WBC 6.7  HGB 11.9*  HCT 38.4*  PLT 177   ------------------------------------------------------------------------------------------------------------------  Chemistries  Recent Labs  Lab 05/06/2019 1416  05/10/19 0436  NA 142   < > 137  K 4.1   < > 4.9  CL 105   < > 104  CO2 29   < > 21*  GLUCOSE 104*   < > 102*  BUN 35*   < > 18  CREATININE 0.74   < > 0.50*  CALCIUM 9.3   < > 8.6*  MG  --   --  2.2  AST 22  --   --   ALT 17  --   --   ALKPHOS 62  --   --   BILITOT 0.8  --   --    < > = values in this interval not displayed.   ------------------------------------------------------------------------------------------------------------------  Cardiac Enzymes No results for input(s): TROPONINI in the last 168 hours. ------------------------------------------------------------------------------------------------------------------  RADIOLOGY:  Dg Naso G Tube Plc W/fl W/rad  Result Date: 04/29/2019 CLINICAL DATA:  Feeding tube needed. EXAM: NASO G TUBE PLACEMENT WITH FL AND WITH RAD CONTRAST:  None. FLUOROSCOPY TIME:  Fluoroscopy Time:  4 minutes 42 seconds Radiation Exposure Index (if provided by the fluoroscopic device): 43.0 mGy COMPARISON:  CT 04/28/2019. FINDINGS: A feeding tube was placed through the left nares down the esophagus into the stomach under fluoroscopic guidance. There were no complications. IMPRESSION: Successful feeding tube placement. Electronically Signed   By: Marcello Moores  Register   On: 04/25/2019 14:56   ASSESSMENT AND PLAN:   85 year old male with history of gastric cancer and two thirds of stomach removed who presented from GI office as a direct admit  due to dysphasia and weight loss.  1.  Severe malnutrition with severe cachexia, weight loss and dysphasia: S/p  dobhoff tube placement but pulled off by patient. History of gastric cancer.  S/p surgery. PT consult; HHPT.  Patient is currently  followed by outpatient Palliative at home. I discussed with the patient's daughter.  His daughter discussed with the patient and they agree to get Dobbhoff tube placement tomorrow. Management plans discussed with the patient and he is in agreement.  CODE STATUS: FULL  TOTAL TIME TAKING CARE OF THIS PATIENT: 32 minutes. POSSIBLE D/C 2 days, DEPENDING ON CLINICAL CONDITION.  Demetrios Loll M.D on 05/11/2019 at 1:24 PM  Between 7am to 6pm - Pager - [phone] After 6pm go to [email or web address] - password [email or web address]  Office  [phone]  CC: Primary care physician; Olin Hauser, DO  Note: This dictation was prepared with Dragon dictation along with smaller phrase technology. Any transcriptional errors that result from this process are unintentional.

## 2019-05-12 ENCOUNTER — Inpatient Hospital Stay: Payer: Medicare Other

## 2019-05-12 DIAGNOSIS — Z85028 Personal history of other malignant neoplasm of stomach: Secondary | ICD-10-CM

## 2019-05-12 DIAGNOSIS — Z681 Body mass index (BMI) 19 or less, adult: Secondary | ICD-10-CM

## 2019-05-12 DIAGNOSIS — R1310 Dysphagia, unspecified: Secondary | ICD-10-CM

## 2019-05-12 DIAGNOSIS — R918 Other nonspecific abnormal finding of lung field: Secondary | ICD-10-CM

## 2019-05-12 DIAGNOSIS — E46 Unspecified protein-calorie malnutrition: Secondary | ICD-10-CM

## 2019-05-12 DIAGNOSIS — R64 Cachexia: Secondary | ICD-10-CM

## 2019-05-12 LAB — BASIC METABOLIC PANEL
Anion gap: 12 (ref 5–15)
BUN: 24 mg/dL — ABNORMAL HIGH (ref 8–23)
CO2: 22 mmol/L (ref 22–32)
Calcium: 8.8 mg/dL — ABNORMAL LOW (ref 8.9–10.3)
Chloride: 109 mmol/L (ref 98–111)
Creatinine, Ser: 0.86 mg/dL (ref 0.61–1.24)
GFR calc Af Amer: >60 mL/min (ref >60)
GFR calc non Af Amer: >60 mL/min (ref >60)
Glucose, Bld: 122 mg/dL — ABNORMAL HIGH (ref 70–99)
Potassium: 3.6 mmol/L (ref 3.5–5.1)
Sodium: 143 mmol/L (ref 135–145)

## 2019-05-12 LAB — MAGNESIUM: Magnesium: 2.1 mg/dL (ref 1.7–2.4)

## 2019-05-12 MED ORDER — ADULT MULTIVITAMIN LIQUID CH
15.0000 mL | Freq: Every day | ORAL | Status: AC
  Administered 2019-05-13: 16:00:00 15 mL
  Filled 2019-05-12: qty 15

## 2019-05-12 MED ORDER — LATANOPROST 0.005 % OP SOLN
1.0000 [drp] | Freq: Every day | OPHTHALMIC | Status: DC
  Administered 2019-05-12 – 2019-05-22 (×10): 1 [drp] via OPHTHALMIC
  Filled 2019-05-12: qty 2.5

## 2019-05-12 MED ORDER — IOHEXOL 300 MG/ML  SOLN: 10.0000 mL | Freq: Once | INTRAMUSCULAR | Status: DC | PRN

## 2019-05-12 NOTE — Progress Notes (Signed)
PHARMACY CONSULT NOTE - FOLLOW UP  Pharmacy Consult for Electrolyte Monitoring and Replacement   Recent Labs: Potassium (mmol/L)  Date Value  05/12/2019 3.6  10/18/2011 4.0   Magnesium (mg/dL)  Date Value  05/12/2019 2.1   Calcium (mg/dL)  Date Value  05/12/2019 8.8 (L)   Calcium, Total (mg/dL)  Date Value  10/18/2011 9.1   Albumin (g/dL)  Date Value  04/18/2019 3.6   Phosphorus (mg/dL)  Date Value  05/10/2019 2.9   Sodium (mmol/L)  Date Value  05/12/2019 143  10/18/2011 138     Assessment: 85 year old male to restart tube feeds today. Pharmacy contacted by dietitian as patient is at risk of refeeding syndrome. MD consulted pharmacy to monitor electrolytes.  Goal of Therapy:  Electrolytes WNL  Plan:  Electrolytes have been ordered with morning labs.  Pharmacy to follow and replace electrolytes as indicated.  Tawnya Crook ,PharmD Clinical Pharmacist 05/12/2019 2:56 PM

## 2019-05-12 NOTE — Consult Note (Signed)
Hematology/Oncology Consult note [facility] [phone] [phone]  Patient Care Team: Olin Hauser, DO as PCP - General (Family Medicine)   Name of the patient: Brian Ferguson  [id number]  [birth year]   Date of visit: 05/12/19 REASON FOR COSULTATION:  Lung mass History of presenting illness-  85 y.o. male with PMH listed at below who was sent by gastroenterology for evaluation and management of progressive weight loss for the past 6 to 45-month malnutrition. Patient is able to provide some history.  I have also called daughter and obtain some history. Patient has been having progressive weight loss for the past 6 to 7 months.  He has a remote history of gastric cancer in 1987, status post partial gastrectomy.  Lives with son who has mental issues.  He has 3 daughters and 1 son.  1 of her daughter DShauna Hughlives locally.  Diane provides food for patient.  Patient denies any swallowing difficulties however Per Diane, patient has been having dysphagia symptoms, for the past 3 to 4 months. She has had a speech eval and patient was found to have severe dysphagia. Patient also told me that he has been active and has been doing " push-up exercises".  Per daughter, patient may feels that he has been doing exercises in his mind, patient has been getting progressively weaker lately. Patient denies any pain. He appears depressed, he mentioned about " I will take a bottle of pills if I have".  Daughter also mentioned that the patient has had similar things to her and other siblings.  Denies any cough, aspiration, nausea, vomiting, abdominal pain. Former smoker, quit remotely.  Surgery has been consulted for G or J-tube placement for enteral feeding solution.  IR cannot do feeding tubes due to altered anatomy secondary to previous surgeries.  04/20/2019 CT chest abdomen pelvis with contrast was independently reviewed by me There is a worrisome right  upper lobe pulmonary lesion 2.5 x 1.4 x 1.3 cm.  No mediastinal or hilar lymphadenopathy or evidence of metastatic disease.  Patchy area of tree-in-bud appearance suggesting chronic inflammation or atypical infection such as MAC. Moderate amount of Endo bronchial diabetes in the right lower lobe Advanced vascular disease.  Numerous hepatic cyst. Surgical changes from gastric surgery, no definite findings for recurrent gastric mass.  04/26/2019 patient had EGD done which showed normal esophagus, patent Billroth II gastrojejunostomy, characterized by congestion and erythema. No examined jejunum.  And externally removable PET placement was attempted but could not be successfully completed. Heme-onc was consulted for lung mass evaluation.  Review of Systems  Constitutional: Positive for fatigue and unexpected weight change. Negative for chills and fever.  HENT:   Negative for hearing loss.   Eyes: Negative for eye problems and icterus.  Respiratory: Negative for chest tightness, cough and shortness of breath.   Cardiovascular: Negative for chest pain and leg swelling.  Gastrointestinal: Positive for constipation. Negative for abdominal distention and abdominal pain.  Genitourinary: Negative for difficulty urinating.   Musculoskeletal: Negative for arthralgias and neck pain.  Skin: Negative for itching and rash.  Neurological: Negative for light-headedness and numbness.  Psychiatric/Behavioral: Negative for suicidal ideas.    No Known Allergies  Patient Active Problem List   Diagnosis Date Noted   Cachexia ([place] 04/22/2019   History of cancer of stomach 03/18/2019   Severe protein-calorie malnutrition ([place] 03/18/2019   Recurrent falls 03/18/2019   Generalized weakness 03/18/2019     Past Medical History:  Diagnosis Date  Cancer ([place])    stomach   Glaucoma    History of blood clots    Malabsorption syndrome      Past Surgical History:  Procedure Laterality Date    HERNIA REPAIR     PEG PLACEMENT N/A 05/01/2019   Procedure: PERCUTANEOUS ENDOSCOPIC GASTROSTOMY (PEG) PLACEMENT;  Surgeon: Toledo, Benay Pike, MD;  Location: [facility] ENDOSCOPY;  Service: Gastroenterology;  Laterality: N/A;   STOMACH SURGERY      Social History   Socioeconomic History   Marital status: Widowed    Spouse name: Not on file   Number of children: Not on file   Years of education: Not on file   Highest education level: Not on file  Occupational History   Not on file  Social Needs   Financial resource strain: Not on file   Food insecurity    Worry: Not on file    Inability: Not on file   Transportation needs    Medical: Not on file    Non-medical: Not on file  Tobacco Use   Smoking status: Former Smoker   Smokeless tobacco: Former Systems developer  Substance and Sexual Activity   Alcohol use: Not Currently   Drug use: No   Sexual activity: Not on file  Lifestyle   Physical activity    Days per week: Not on file    Minutes per session: Not on file   Stress: Not on file  Relationships   Social connections    Talks on phone: Not on file    Gets together: Not on file    Attends religious service: Not on file    Active member of club or organization: Not on file    Attends meetings of clubs or organizations: Not on file    Relationship status: Not on file   Intimate partner violence    Fear of current or ex partner: Not on file    Emotionally abused: Not on file    Physically abused: Not on file    Forced sexual activity: Not on file  Other Topics Concern   Not on file  Social History Narrative   Lives at home with son.     History reviewed. No pertinent family history.   Current Facility-Administered Medications:    0.9 %  sodium chloride infusion, , Intravenous, Continuous, Epifanio Lesches, MD, Last Rate: 75 mL/hr at 05/12/19 4650   acetaminophen (TYLENOL) tablet 650 mg, 650 mg, Oral, Q6H PRN **OR** acetaminophen (TYLENOL) suppository 650  mg, 650 mg, Rectal, Q6H PRN, Epifanio Lesches, MD   bisacodyl (DULCOLAX) EC tablet 5 mg, 5 mg, Oral, Daily PRN, Epifanio Lesches, MD   docusate sodium (COLACE) capsule 100 mg, 100 mg, Oral, BID, Epifanio Lesches, MD   feeding supplement (OSMOLITE 1.5 CAL) liquid 1,000 mL, 1,000 mL, Per Tube, Continuous, Mody, Sital, MD, Last Rate: 15 mL/hr at 05/12/19 1127, 1,000 mL at 05/12/19 1127   feeding supplement (PRO-STAT SUGAR FREE 64) liquid 30 mL, 30 mL, Per Tube, Daily, Mody, Sital, MD   free water 30 mL, 30 mL, Per Tube, Q4H, Mody, Sital, MD, 30 mL at 05/12/19 1136   heparin injection 5,000 Units, 5,000 Units, Subcutaneous, Q8H, Epifanio Lesches, MD, 5,000 Units at 05/12/19 1448   iohexol (OMNIPAQUE) 240 MG/ML injection 50 mL, 50 mL, Oral, Once PRN, Epifanio Lesches, MD   iohexol (OMNIPAQUE) 300 MG/ML solution 10 mL, 10 mL, Other, Once PRN, Demetrios Loll, MD   latanoprost (XALATAN) 0.005 % ophthalmic solution 1 drop, 1 drop, Both Eyes,  Cherene Julian, MD   MEDLINE mouth rinse, 15 mL, Mouth Rinse, BID, Demetrios Loll, MD, 15 mL at 05/11/19 2142   [START ON 05/13/2019] multivitamin liquid 15 mL, 15 mL, Per Tube, Daily, Demetrios Loll, MD   ondansetron [facility]) tablet 4 mg, 4 mg, Oral, Q6H PRN **OR** ondansetron (ZOFRAN) injection 4 mg, 4 mg, Intravenous, Q6H PRN, Epifanio Lesches, MD, 4 mg at 05/11/19 1734   traZODone (DESYREL) tablet 25 mg, 25 mg, Oral, QHS PRN, Epifanio Lesches, MD   Physical exam Vitals:   05/11/19 0526 05/11/19 1638 05/11/19 1929 05/12/19 0445  BP: 112/65 139/78 (!) 153/77 (!) 150/83  Pulse: (!) 59 71 71 69  Resp: _0 Temp: 98.2 F (36.8 C) 97.7 F (36.5 C) 98.7 F (37.1 C) 98.4 F (36.9 C)  TempSrc: Oral Oral Oral Oral  SpO2: 99% 100% 100% 97%  Weight:    110 lb 0.2 oz (49.9 kg)  Height:       Physical Exam  Constitutional: No distress.  Frail, ill appearance cachectic male lying in bed.  HENT:  Head: Normocephalic and  atraumatic.  Mouth/Throat: No oropharyngeal exudate.  Eyes: Pupils are equal, round, and reactive to light. EOM are normal. No scleral icterus.  Neck: Normal range of motion. Neck supple.  Cardiovascular: Normal rate and regular rhythm.  Pulmonary/Chest: Effort normal. No respiratory distress.  Abdominal: Soft. Bowel sounds are normal. He exhibits no distension. There is no abdominal tenderness.  Musculoskeletal: Normal range of motion.  Neurological: He is alert. No cranial nerve deficit. He exhibits abnormal muscle tone.  Skin: Skin is warm and dry. He is not diaphoretic. No erythema.  Midline scar noted.  Psychiatric: Affect normal.        CMP Latest Ref Rng & Units 05/12/2019  Glucose 70 - 99 mg/dL 122(H)  BUN 8 - 23 mg/dL 24(H)  Creatinine 0.61 - 1.24 mg/dL 0.86  Sodium 135 - 145 mmol/L 143  Potassium 3.5 - 5.1 mmol/L 3.6  Chloride 98 - 111 mmol/L 109  CO2 22 - 32 mmol/L 22  Calcium 8.9 - 10.3 mg/dL 8.8(L)  Total Protein 6.5 - 8.1 g/dL -  Total Bilirubin 0.3 - 1.2 mg/dL -  Alkaline Phos 38 - 126 U/L -  AST 15 - 41 U/L -  ALT 0 - 44 U/L -   CBC Latest Ref Rng & Units 05/08/2019  WBC 4.0 - 10.5 K/uL 6.7  Hemoglobin 13.0 - 17.0 g/dL 11.9(L)  Hematocrit 39.0 - 52.0 % 38.4(L)  Platelets 150 - 400 K/uL 177     RADIOGRAPHIC STUDIES: I have personally reviewed the radiological images as listed and agreed with the findings in the report.  Ct Chest W Contrast  Result Date: 05/08/2019 CLINICAL DATA:  Remote history of gastric cancer. Patient is having progressive weight loss. Symptoms of dysphagia. EXAM: CT CHEST, ABDOMEN, AND PELVIS WITH CONTRAST TECHNIQUE: Multidetector CT imaging of the chest, abdomen and pelvis was performed following the standard protocol during bolus administration of intravenous contrast. CONTRAST:  95m OMNIPAQUE IOHEXOL 300 MG/ML  SOLN COMPARISON:  None. FINDINGS: CT CHEST FINDINGS Cardiovascular: The heart is normal in size. There is age-related  tortuosity, ectasia and calcification of the thoracic aorta but no dissection or focal aneurysm. The branch vessels are patent. Moderate three-vessel coronary artery calcifications are noted. Mediastinum/Nodes: No mediastinal or hilar mass or lymphadenopathy. The esophagus is grossly normal. Lungs/Pleura: There is a 2.5 x 1.4 x 1.3 cm lesion in the right upper lobe suspicious  for neoplasm but possibly scar tissue. Without prior studies for comparison PET-CT may be helpful for further evaluation. Patchy areas of tree-in-bud opacity in the left upper lobe and left lower lobe suggesting chronic inflammation or possibly atypical infection such as MAC. Endobronchial debris in the right lower lobe along with peribronchial thickening may suggest aspiration. No right lower lobe pneumonia. Patchy atelectasis or scarring change noted in the superior segment of the left lower lobe. Musculoskeletal: Fairly marked cachexia. No chest wall mass or axillary adenopathy. The bony structures are intact.  No bone lesions are identified. CT ABDOMEN PELVIS FINDINGS Hepatobiliary: Numerous hepatic cysts. No worrisome hepatic lesions or intrahepatic biliary dilatation. Pancreas: No mass, inflammation or ductal dilatation. Moderate pancreatic atrophy. Spleen: Normal size.  No worrisome lesions. Adrenals/Urinary Tract: The adrenal glands and kidneys are unremarkable. A right renal cyst is noted with some thin peripheral calcifications. No hydronephrosis. The bladder is grossly normal. Stomach/Bowel: Surgical changes noted involving the stomach but I do not see any findings suspicious for recurrent mass. The small bowel and colon are grossly normal. Vascular/Lymphatic: Marked tortuosity and mild ectasia of the abdominal aorta with moderate atherosclerotic calcifications but no focal aneurysm or dissection. The branch vessels are patent. There is marked tortuosity, ectasia and calcification of the iliac arteries. Fusiform aneurysmal dilatation  of the right common iliac artery measuring 2.4 cm. No obvious mesenteric or retroperitoneal mass or adenopathy. Reproductive: The prostate gland and seminal vesicles are grossly normal. Other: No pelvic mass or free pelvic fluid collections. Musculoskeletal: Scoliosis and advanced degenerative lumbar spondylosis but no worrisome bone lesions. IMPRESSION: 1. Worrisome right upper lobe pulmonary lesion measuring 2.5 x 1.4 x 1.3 cm. PET-CT may be helpful for further evaluation. No mediastinal or hilar lymphadenopathy or evidence of metastatic disease elsewhere. 2. Patchy areas of tree-in-bud appearance suggesting chronic inflammation or atypical infection such as MAC. 3. Moderate amount of endobronchial debris in the right lower lobe along with peribronchial thickening possibly due to aspiration but no definite findings for pneumonia. 4. Numerous hepatic cysts but no worrisome hepatic lesions. There is also a complex upper pole right renal cyst with areas of peripheral calcification but no solid enhancing components. 5. Advanced vascular disease. 6. Surgical changes from gastric surgery without complicating features. I do not see any definite findings for recurrent gastric mass. Electronically Signed   By: Marijo Sanes M.D.   On: 05/08/2019 08:51   Ct Abdomen Pelvis W Contrast  Result Date: 05/08/2019 CLINICAL DATA:  Remote history of gastric cancer. Patient is having progressive weight loss. Symptoms of dysphagia. EXAM: CT CHEST, ABDOMEN, AND PELVIS WITH CONTRAST TECHNIQUE: Multidetector CT imaging of the chest, abdomen and pelvis was performed following the standard protocol during bolus administration of intravenous contrast. CONTRAST:  29m OMNIPAQUE IOHEXOL 300 MG/ML  SOLN COMPARISON:  None. FINDINGS: CT CHEST FINDINGS Cardiovascular: The heart is normal in size. There is age-related tortuosity, ectasia and calcification of the thoracic aorta but no dissection or focal aneurysm. The branch vessels are patent.  Moderate three-vessel coronary artery calcifications are noted. Mediastinum/Nodes: No mediastinal or hilar mass or lymphadenopathy. The esophagus is grossly normal. Lungs/Pleura: There is a 2.5 x 1.4 x 1.3 cm lesion in the right upper lobe suspicious for neoplasm but possibly scar tissue. Without prior studies for comparison PET-CT may be helpful for further evaluation. Patchy areas of tree-in-bud opacity in the left upper lobe and left lower lobe suggesting chronic inflammation or possibly atypical infection such as MAC. Endobronchial debris in the right  lower lobe along with peribronchial thickening may suggest aspiration. No right lower lobe pneumonia. Patchy atelectasis or scarring change noted in the superior segment of the left lower lobe. Musculoskeletal: Fairly marked cachexia. No chest wall mass or axillary adenopathy. The bony structures are intact.  No bone lesions are identified. CT ABDOMEN PELVIS FINDINGS Hepatobiliary: Numerous hepatic cysts. No worrisome hepatic lesions or intrahepatic biliary dilatation. Pancreas: No mass, inflammation or ductal dilatation. Moderate pancreatic atrophy. Spleen: Normal size.  No worrisome lesions. Adrenals/Urinary Tract: The adrenal glands and kidneys are unremarkable. A right renal cyst is noted with some thin peripheral calcifications. No hydronephrosis. The bladder is grossly normal. Stomach/Bowel: Surgical changes noted involving the stomach but I do not see any findings suspicious for recurrent mass. The small bowel and colon are grossly normal. Vascular/Lymphatic: Marked tortuosity and mild ectasia of the abdominal aorta with moderate atherosclerotic calcifications but no focal aneurysm or dissection. The branch vessels are patent. There is marked tortuosity, ectasia and calcification of the iliac arteries. 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 No obvious mesenteric or retroperitoneal mass or adenopathy. Reproductive:  The prostate gland and seminal vesicles are grossly normal. Other: No pelvic mass or free pelvic fluid collections. Musculoskeletal: Scoliosis and advanced degenerative lumbar spondylosis but no worrisome bone lesions. IMPRESSION: 1. Worrisome right upper lobe pulmonary lesion measuring 2.5 x 1.4 x 1.3 cm. PET-CT may be helpful for further evaluation. No mediastinal or hilar lymphadenopathy or evidence of metastatic disease elsewhere. 2. Patchy areas of tree-in-bud appearance suggesting chronic inflammation or atypical infection such as MAC. 3. Moderate amount of endobronchial debris in the right lower lobe along with peribronchial thickening possibly due to aspiration but no definite findings for pneumonia. 4. Numerous hepatic cysts but no worrisome hepatic lesions. There is also a complex upper pole right renal cyst with areas of peripheral calcification but no solid enhancing components. 5. Advanced vascular disease. 6. Surgical changes from gastric surgery without complicating features. I do not see any definite findings for recurrent gastric mass. Electronically Signed   By: Marijo Sanes M.D.   On: 05/08/2019 08:51   Dg Carlena Hurl Op [employer] Speech Path  Result Date: 05/01/2019 CLINICAL DATA:  Weight loss.  Dysphagia. EXAM: MODIFIED BARIUM SWALLOW TECHNIQUE: Different consistencies of barium were administered orally to the patient by the Speech Pathologist. Imaging of the pharynx was performed in the lateral projection. The radiologist was present in the fluoroscopy room for this study, providing personal supervision. FLUOROSCOPY TIME:  Fluoroscopy Time:  1 minutes Number of Acquired Spot Images: 0 COMPARISON:  None. FINDINGS: There was aspiration during the study. Please see the speech pathologist's report for full details. IMPRESSION: There was aspiration during the study. Please see the speech pathologist's report for full details. Please refer to the Speech Pathologists report for complete details  and recommendations. Electronically Signed   By: Dorise Bullion III M.D   On: 05/01/2019 14:08   Dg Loyce Dys Tube Plc W/fl W/rad  Result Date: 05/12/2019 CLINICAL DATA:  Malnutrition, tube placement EXAM: NASO G TUBE PLACEMENT WITH FL AND WITH RAD CONTRAST:  10 mL Isovue-300 FLUOROSCOPY TIME:  Fluoroscopy Time:  00:30 Number of Acquired Spot Images: 1 COMPARISON:  None. FINDINGS: Using lidocaine jelly for local anesthesia, a weighted enteric feeding tube was advanced through the right nostril to the partially resected stomach under fluoroscopic guidance without complication. A small volume of water soluble contrast was injected to confirm intragastric tube location and the tube  was secured. IMPRESSION: Successful fluoroscopic placement of weighted enteric feeding tube within the partially resected stomach. Electronically Signed   By: Eddie Candle M.D.   On: 05/12/2019 09:18   Dg Addison Bailey G Tube Plc W/fl W/rad  Result Date: 05/15/2019 CLINICAL DATA:  Feeding tube needed. EXAM: NASO G TUBE PLACEMENT WITH FL AND WITH RAD CONTRAST:  None. FLUOROSCOPY TIME:  Fluoroscopy Time:  4 minutes 42 seconds Radiation Exposure Index (if provided by the fluoroscopic device): 43.0 mGy COMPARISON:  CT 05/08/2019. FINDINGS: A feeding tube was placed through the left nares down the esophagus into the stomach under fluoroscopic guidance. There were no complications. IMPRESSION: Successful feeding tube placement. Electronically Signed   By: Marcello Moores  Register   On: 04/18/2019 14:56   Dg Foot Complete Right  Result Date: 05/02/2019 Please see detailed radiograph report in office note.   Assessment and plan- Patient is a 85 y.o. male with a history of gastric cancer in 1987, status post partial gastrectomy, History of blood clots, glaucoma, malabsorption syndrome presented for evaluation of progressive weight loss.  Was found to have severe dysphagia.  Image work-up showed lung mass.  #Cachectic, malnutrition secondary to  dysphagia. Surgery Dr. Lysle Pearl has been consulted for evaluation for enteral feeding tube placement.  #Lung nodule Patient may have underlying primary lung malignancy.  No mediastinal lymphadenopathy or hilar lymphadenopathy. Possible early stage lung cancer. I had a discussion with patient and later also called patient's daughter [name] and had another lengthy discussion with her. Currently patient is very weak with poor performance status, he is not going to be a candidate of any cancer treatment even if we were able to proceed with diagnostic work-up for lung nodule at this point. No radiographic evidence of thoracic lymphadenopathy, possible early stage lung cancer which has lower priority than improving his malnutrition cachectic status. I would recommend patient to improve nutrition first then proceed with outpatient PET scan and biopsy if patient is willing to proceed with diagnostic work-up.  #History of gastric cancer, remote history in 83.  Pathology is not available to me at this point. No definitive radiographic evidence of local or distant metastasis at this point. # depression.  He has mentioned to me and also to his family members that he wants to swallow a bottle of pills.  Recommend further evaluation.  Above plans were discussed with Dr.Sakai and Dr.Chen via secure chat.   Dr.Chen, thank you for allowing me to participate in the care of this patient.  Total face to face encounter time for this patient visit was 70 min. >50% of the time was  spent in counseling and coordination of care.    Earlie Server, MD, PhD Hematology Oncology [facility] at [facility] Pager- [phone] 05/12/2019

## 2019-05-12 NOTE — Progress Notes (Addendum)
[facility] NAME: Brian Ferguson    MR#:  [id number]  DATE OF BIRTH:  July 31, 1929  SUBJECTIVE:   The patient was inserted weighted enteric feeding tube by IR this morning. REVIEW OF SYSTEMS:    Review of Systems  Constitutional: Negative for fever, chills ++ poor appetite with weight loss HENT: Negative for ear pain, nosebleeds, congestion, facial swelling, rhinorrhea, neck pain, neck stiffness and ear discharge.   Respiratory: Negative for cough, shortness of breath, wheezing  Cardiovascular: Negative for chest pain, palpitations and leg swelling.  Gastrointestinal: Negative for heartburn, abdominal pain, vomiting, diarrhea or consitpation Genitourinary: Negative for dysuria, urgency, frequency, hematuria Musculoskeletal: Negative for back pain or joint pain Neurological: Negative for dizziness, seizures, syncope, focal weakness,  numbness and headaches.  Hematological: Does not bruise/bleed easily.  Psychiatric/Behavioral: Negative for hallucinations, confusion, dysphoric mood Tolerating Diet: npo DRUG ALLERGIES:  No Known Allergies  VITALS:  Blood pressure (!) 150/83, pulse 69, temperature 98.4 F (36.9 C), temperature source Oral, resp. rate 16, height 5\' 10"  (1.778 m), weight 49.9 kg, SpO2 97 %.  PHYSICAL EXAMINATION:  Constitutional: Appears frail thin cachectic no distress. HENT: Normocephalic. Marland Kitchen Oropharynx is clear and moist.  Eyes: Conjunctivae and EOM are normal. PERRLA, no scleral icterus.  Neck: Normal ROM. Neck supple. No JVD. No tracheal deviation. CVS: RRR, S1/S2 +, no murmurs, no gallops, no carotid bruit.  Pulmonary: Effort and breath sounds normal, no stridor, rhonchi, wheezes, rales.  Abdominal: Soft. BS +,  no distension, tenderness, rebound or guarding.  Musculoskeletal: Normal range of motion. No edema and no tenderness.  Neuro: Alert. CN 2-12 grossly intact. No focal deficits. Skin: Skin is warm and dry. No  rash noted. Psychiatric: Normal mood and affect.  LABORATORY PANEL:   CBC Recent Labs  Lab 05/08/19 0251  WBC 6.7  HGB 11.9*  HCT 38.4*  PLT 177   ------------------------------------------------------------------------------------------------------------------  Chemistries  Recent Labs  Lab 05/16/2019 1416  05/12/19 0756  NA 142   < > 143  K 4.1   < > 3.6  CL 105   < > 109  CO2 29   < > 22  GLUCOSE 104*   < > 122*  BUN 35*   < > 24*  CREATININE 0.74   < > 0.86  CALCIUM 9.3   < > 8.8*  MG  --    < > 2.1  AST 22  --   --   ALT 17  --   --   ALKPHOS 62  --   --   BILITOT 0.8  --   --    < > = values in this interval not displayed.   ------------------------------------------------------------------------------------------------------------------  Cardiac Enzymes No results for input(s): TROPONINI in the last 168 hours. ------------------------------------------------------------------------------------------------------------------  RADIOLOGY:  Dg Naso G Tube Plc W/fl W/rad  Result Date: 05/12/2019 CLINICAL DATA:  Malnutrition, tube placement EXAM: NASO G TUBE PLACEMENT WITH FL AND WITH RAD CONTRAST:  10 mL Isovue-300 FLUOROSCOPY TIME:  Fluoroscopy Time:  00:30 Number of Acquired Spot Images: 1 COMPARISON:  None. FINDINGS: Using lidocaine jelly for local anesthesia, a weighted enteric feeding tube was advanced through the right nostril to the partially resected stomach under fluoroscopic guidance without complication. A small volume of water soluble contrast was injected to confirm intragastric tube location and the tube was secured. IMPRESSION: Successful fluoroscopic placement of weighted enteric feeding tube within the partially resected stomach. Electronically Signed   By: Cristie Hem  Laqueta Carina M.D.   On: 05/12/2019 09:18   ASSESSMENT AND PLAN:   85 year old male with history of gastric cancer and two thirds of stomach removed who presented from GI office as a direct admit due  to dysphasia and weight loss.  1.  Severe malnutrition with severe cachexia, weight loss and dysphasia: S/p  dobhoff tube placement but pulled off by patient. History of gastric cancer.  S/p surgery. Per radiologist, it is very difficult to get a J-tube placement.  He placed weighted enteric feeding tube for tube feeding.  Dr. Alice Reichert requested a surgical consult.  According to Dr. Lysle Pearl, recommend work-up for incidental finding of lung lesion before surgical placement of feeding tube.  Right upper lobe pulmonary lesion. Oncology consult.  PT consult; HHPT.  Patient is currently followed by outpatient Palliative at home. I discussed with Dr. Alice Reichert and Dr. Lysle Pearl. I discussed with the patient's daughter [name], who discussed with her father (the patient) and sister.  They decided to take risk to get surgical placement of feeding tube and work-up for lung lesion. Management plans discussed with the patient and he is in agreement.  CODE STATUS: FULL  TOTAL TIME TAKING CARE OF THIS PATIENT: 42 minutes. POSSIBLE D/C 2 days, DEPENDING ON CLINICAL CONDITION.  Demetrios Loll M.D on 05/12/2019 at 1:08 PM  Between 7am to 6pm - Pager - [phone] After 6pm go to [email or web address] - password [email or web address] [facility]  Office  [phone]  CC: Primary care physician; Olin Hauser, DO  Note: This dictation was prepared with Dragon dictation along with smaller phrase technology. Any transcriptional errors that result from this process are unintentional.

## 2019-05-12 NOTE — Consult Note (Signed)
Subjective:   CC: Cachexia and dysphagia  HPI:  Brian Ferguson is a 85 y.o. male who was consulted by Bridgett Larsson for evaluation of above.  Presented to GI service as an outpatient for history of weight loss and progressive dysphagia.  Speech evaluation recommended n.p.o. due to risk of aspiration so was admitted for IV fluids and feeding tube placement.  During the work-up, including a CT scan, he was noted to have an incidental new pulmonary lesion.  Today patient has no specific complaints.  Was irritated about having to need additional procedures.  Denies any specific cough, aspiration, nausea/ emesis.  Could not recall if wt loss started before our shortly after issues with dysphagia.  Past Medical History:  has a past medical history of Cancer ([place]), Glaucoma, History of blood clots, and Malabsorption syndrome.  Past Surgical History:  has a past surgical history that includes Stomach surgery; Hernia repair; and PEG placement (N/A, 05/06/2019).  Family History: family history is not on file.  Social History:  reports that he has quit smoking. He has quit using smokeless tobacco. He reports previous alcohol use. He reports that he does not use drugs.  Current Medications:  Medications Prior to Admission  Medication Sig Dispense Refill  . latanoprost (XALATAN) 0.005 % ophthalmic solution Place 1 drop into both eyes at bedtime.    . vitamin C (ASCORBIC ACID) 500 MG tablet Take 500 mg by mouth daily.      Allergies:  No Known Allergies  ROS:  General: Denies fevers, chills, and night sweats. Eyes: Denies blurry vision, double vision, eye pain, itchy eyes, and tearing. Ears: Denies hearing loss, earache, and ringing in ears. Nose: Denies sinus pain, congestion, infections, runny nose, and nosebleeds. Mouth/throat: Denies hoarseness, sore throat, bleeding gums, and difficulty swallowing. Heart: Denies chest pain, palpitations, racing heart, irregular heartbeat, leg pain or swelling,   Respiratory: Denies breathing difficulty, shortness of breath, wheezing, cough, and sputum. GI: Denies nausea, vomiting, constipation, diarrhea, and blood in stool. GU: Denies difficulty urinating, pain with urinating, urgency, frequency, blood in urine. Musculoskeletal: Denies joint stiffness, pain, swelling, muscle weakness. Skin: Denies rash, itching, mass, tumors, sores, and boils Neurologic: Denies headache, fainting, dizziness, seizures, numbness, and tingling. Psychiatric: Denies depression, anxiety, difficulty sleeping, and memory loss. Endocrine: Denies heat or cold intolerance, and increased thirst or urination. Blood/lymph: Denies easy bruising, easy bruising, and swollen glands     Objective:     BP (!) 150/83 (BP Location: Left Arm)   Pulse 69   Temp 98.4 F (36.9 C) (Oral)   Resp 16   Ht _0  (1.778 m)   Wt 49.9 kg   SpO2 97%   BMI 15.78 kg/m   Constitutional :  alert, cooperative, appears older than stated age, cachectic and no distress  Lymphatics/Throat:  no asymmetry, masses, or scars  Respiratory:  clear to auscultation bilaterally  Cardiovascular:  regular rate and rhythm  Gastrointestinal: soft, non-tender; bowel sounds normal; no masses,  no organomegaly.  Musculoskeletal: Steady gait and movement  Skin: Cool and moist, midline surgical scars noted.  Chronic venous insufficiency like discoloration noted BLE  Psychiatric: Normal affect, non-agitated, not confused       LABS:  CMP Latest Ref Rng & Units 05/12/2019 05/10/2019 05/08/2019  Glucose 70 - 99 mg/dL 122(H) 102(H) 92  BUN 8 - 23 mg/dL 24(H) 18 32(H)  Creatinine 0.61 - 1.24 mg/dL 0.86 0.50(L) 0.60(L)  Sodium 135 - 145 mmol/L 143 137 142  Potassium 3.5 - 5.1  mmol/L 3.6 4.9 4.0  Chloride 98 - 111 mmol/L 109 104 109  CO2 22 - 32 mmol/L 22 21(L) 27  Calcium 8.9 - 10.3 mg/dL 8.8(L) 8.6(L) 8.6(L)  Total Protein 6.5 - 8.1 g/dL - - -  Total Bilirubin 0.3 - 1.2 mg/dL - - -  Alkaline Phos 38 - 126 U/L  - - -  AST 15 - 41 U/L - - -  ALT 0 - 44 U/L - - -   CBC Latest Ref Rng & Units 05/08/2019 04/17/2019 03/18/2019  WBC 4.0 - 10.5 K/uL 6.7 8.1 9.4  Hemoglobin 13.0 - 17.0 g/dL 11.9(L) 12.4(L) 12.7(L)  Hematocrit 39.0 - 52.0 % 38.4(L) 39.8 40.4  Platelets 150 - 400 K/uL 177 194 200    RADS: CLINICAL DATA:  Remote history of gastric cancer. Patient is having progressive weight loss. Symptoms of dysphagia.  EXAM: CT CHEST, ABDOMEN, AND PELVIS WITH CONTRAST  TECHNIQUE: Multidetector CT imaging of the chest, abdomen and pelvis was performed following the standard protocol during bolus administration of intravenous contrast.  CONTRAST:  9m OMNIPAQUE IOHEXOL 300 MG/ML  SOLN  COMPARISON:  None.  FINDINGS: CT CHEST FINDINGS  Cardiovascular: The heart is normal in size. There is age-related tortuosity, ectasia and calcification of the thoracic aorta but no dissection or focal aneurysm. The branch vessels are patent. Moderate three-vessel coronary artery calcifications are noted.  Mediastinum/Nodes: No mediastinal or hilar mass or lymphadenopathy. The esophagus is grossly normal.  Lungs/Pleura: There is a 2.5 x 1.4 x 1.3 cm lesion in the right upper lobe suspicious for neoplasm but possibly scar tissue. Without prior studies for comparison PET-CT may be helpful for further evaluation.  Patchy areas of tree-in-bud opacity in the left upper lobe and left lower lobe suggesting chronic inflammation or possibly atypical infection such as MAC.  Endobronchial debris in the right lower lobe along with peribronchial thickening may suggest aspiration. No right lower lobe pneumonia.  Patchy atelectasis or scarring change noted in the superior segment of the left lower lobe.  Musculoskeletal: Fairly marked cachexia. No chest wall mass or axillary adenopathy.  The bony structures are intact.  No bone lesions are identified.  CT ABDOMEN PELVIS FINDINGS  Hepatobiliary:  Numerous hepatic cysts. No worrisome hepatic lesions or intrahepatic biliary dilatation.  Pancreas: No mass, inflammation or ductal dilatation. Moderate pancreatic atrophy.  Spleen: Normal size.  No worrisome lesions.  Adrenals/Urinary Tract: The adrenal glands and kidneys are unremarkable. A right renal cyst is noted with some thin peripheral calcifications. No hydronephrosis. The bladder is grossly normal.  Stomach/Bowel: Surgical changes noted involving the stomach but I do not see any findings suspicious for recurrent mass. The small bowel and colon are grossly normal.  Vascular/Lymphatic: Marked tortuosity and mild ectasia of the abdominal aorta with moderate atherosclerotic calcifications but no focal aneurysm or dissection. The branch vessels are patent. There is marked tortuosity, ectasia and calcification of the iliac arteries. 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  No obvious mesenteric or retroperitoneal mass or adenopathy.  Reproductive: The prostate gland and seminal vesicles are grossly normal.  Other: No pelvic mass or free pelvic fluid collections.  Musculoskeletal: Scoliosis and advanced degenerative lumbar spondylosis but no worrisome bone lesions.  IMPRESSION: 1. Worrisome right upper lobe pulmonary lesion measuring 2.5 x 1.4 x 1.3 cm. PET-CT may be helpful for further evaluation. No mediastinal or hilar lymphadenopathy or evidence of metastatic disease elsewhere. 2. Patchy areas of tree-in-bud appearance suggesting chronic inflammation or  atypical infection such as MAC. 3. Moderate amount of endobronchial debris in the right lower lobe along with peribronchial thickening possibly due to aspiration but no definite findings for pneumonia. 4. Numerous hepatic cysts but no worrisome hepatic lesions. There is also a complex upper pole right renal cyst with areas of peripheral calcification but no solid  enhancing components. 5. Advanced vascular disease. 6. Surgical changes from gastric surgery without complicating features. I do not see any definite findings for recurrent gastric mass.   Electronically Signed   By: Marijo Sanes M.D.   On: 05/08/2019 08:51  Assessment:      Dysphagia causing severe malnutrition and cachexia Chronic venous insufficiency bilateral lower extremities Incidental new asymptomatic right lung lesion   Plan:     1.  I confirmed with interventional radiology that due to his altered anatomy they would not be able to place any feeding tubes.  We can consider open G or J-tube placement for more permanent enteral feeding solution, but will like to investigate further new onset pulmonary lesion to determine if the high risk surgical procedure will be worth it for the patient.    Recommend continuing nutrition supplementation via the Dobbhoff that is in place and working appropriately at this time.  Plan discussed with daughter Beverlee Nims along with the patient, and all are in agreement.  Exam of the bilateral lower extremity lesions look consistent with possible venous insufficiency.  Discussed with daughter that this is not a urgent matter and can be worked up as an outpatient.  She verbalized understanding.  Surgery will peripherally follow until lung lesion work-up is completed.

## 2019-05-12 NOTE — TOC Initial Note (Signed)
Transition of Care [facility]) - Initial/Assessment Note    Patient Details  Name: Brian Ferguson MRN: [id number] Date of Birth: 1929-05-21  Transition of [facility] CM/SW Contact:    Shelbie Hutching, RN Phone Number: 05/12/2019, 10:55 AM  Clinical Narrative:                 Patient admitted with cachexia, history of stomach cancer.  Patient is emaciated in appearance, thin and frail.  Patient lives with his son, patient's son has mental disabilities and will not be able to provide care at home.  Patient will need some type of home care services, he is followed by outpatient palliative.  Daughter Diane lives close by but expresses concerns with being able to care for her father also.  Patient's daughter requests update from physician, attending and GI MD.  Daughter reports that she is confused about plan of care and what is going on, she thought patient was getting a feeding tube in his stomach not one in his nose.  Daughter requests that patient get his eye drops for glaucoma and that vascular surgery see the patient about his leg pain.  RNCM will discus plan of care with MD and with bedside RN.  Physical therapy has recommended home health, patient defers to daughter for choice in agency.  Patient states he does not want hospice.   RNCM talked with daughter Lorin Mercy reports that her sister [name] is the [place].  Diane reports that she thinks her father would be okay with hospice at home but he would not want to go to a facility.  RNCM will cont to follow patient and assist with discharge planning.   Expected Discharge Plan: [place] Barriers to Discharge: Continued Medical Work up   Patient Goals and CMS Choice Patient states their goals for this hospitalization and ongoing recovery are:: Patient wants something to drink and something to eat, reports no food or drink for 5 days now.      Expected Discharge Plan and Services Expected Discharge Plan: [place]   Discharge Planning Services: CM Consult   Living arrangements for the past 2 months: Single Family Home                                      Prior Living Arrangements/Services Living arrangements for the past 2 months: Single Family Home Lives with:: Adult Children(lives with son) Patient language and need for interpreter reviewed:: No Do you feel safe going back to the place where you live?: Yes      Need for Family Participation in Patient Care: Yes (Comment)(trouble walking and caring for self) Care giver support system in place?: Yes (comment)(lives with son, daughter lives close by- another daughter at [facility])   Criminal Activity/Legal Involvement Pertinent to Current Situation/Hospitalization: No - Comment as needed  Activities of Daily Living Home Assistive Devices/Equipment: Environmental consultant (specify type) ADL Screening (condition at time of admission) Patient's cognitive ability adequate to safely complete daily activities?: Yes Is the patient deaf or have difficulty hearing?: No Does the patient have difficulty seeing, even when wearing glasses/contacts?: No Does the patient have difficulty concentrating, remembering, or making decisions?: No Patient able to express need for assistance with ADLs?: Yes Does the patient have difficulty dressing or bathing?: No Independently performs ADLs?: Yes (appropriate for developmental age) Does the patient have difficulty walking or climbing  stairs?: No Weakness of Legs: None Weakness of Arms/Hands: None  Permission Sought/Granted Permission sought to share information with : Case Manager, Family Supports             Permission granted to share info w Contact Information: Daughter [name] and [name]  Emotional Assessment Appearance:: Appears stated age Attitude/Demeanor/Rapport: Engaged Affect (typically observed): Accepting Orientation: : Oriented to Self, Oriented to Place, Oriented to  Time, Oriented to  Situation Alcohol / Substance Use: Not Applicable Psych Involvement: No (comment)  Admission diagnosis:  Weight loss dehydration dysphagia Patient Active Problem List   Diagnosis Date Noted  . Cachexia ([place]) 04/21/2019  . History of cancer of stomach 03/18/2019  . Severe protein-calorie malnutrition ([place]) 03/18/2019  . Recurrent falls 03/18/2019  . Generalized weakness 03/18/2019   PCP:  Olin Hauser, DO Pharmacy:   [id number] [place], [address] [place] [address] Phone: [phone] Fax: [phone]  [id number] - [place], [address] [place] [address] Phone: [phone] Fax: [place], [address] [place] [address] Phone: [phone] Fax: [phone]     Social Determinants of Health (SDOH) Interventions    Readmission Risk Interventions No flowsheet data found.

## 2019-05-12 NOTE — Progress Notes (Signed)
Nutrition Follow-up  DOCUMENTATION CODES:   Severe malnutrition in context of chronic illness, Underweight  INTERVENTION:  Osmolite 1.5 Cal was resumed at 15 mL/hr per tube. Advance by 15 mL/hr ever 12 hours to goal rate of 45 mL/hr. Also provide Pro-Stat 30 mL once daily per tube. Goal regimen provides 1720 kcal, 83 grams of protein, 821 mL H2O daily.  While on IV fluids provide a minimum free water flush of 30 mL Q4hrs to maintain tube patency. If plan is to transition to meeting fluid needs with enteral regimen, recommend a free water flush of 120 mL Q4hrs. Provides 1541 mL H2O daily including water in TF regimen.  If patient does not tolerate Osmolite can consider using a peptide-based formula (Vital).   Provide liquid MVI daily per tube.  Monitor magnesium, potassium, and phosphorus daily for at least 3 days, MD to replete as needed, as pt is at risk for refeeding syndrome given severe malnutrition.  NUTRITION DIAGNOSIS:   Severe Malnutrition related to chronic illness(hx Billroth II gastrojejunostomy in setting of gastric cancer, dysphagia) as evidenced by severe fat depletion, severe muscle depletion.  New nutrition diagnosis.  GOAL:   Patient will meet greater than or equal to 90% of their needs  Progressing with advancement of tube feeds.  MONITOR:   Labs, Weight trends, TF tolerance, I & O's  REASON FOR ASSESSMENT:   Consult Assessment of nutrition requirement/status, Enteral/tube feeding initiation and management  ASSESSMENT:   85 year old male with PMHx of gastric cancer in 1987 s/p removal of 2/3 of stomach, hx resection of some bowel in 1989 (unsure of reason removed), glaucoma, malabsorption syndrome admitted from GI clinic for dysphagia, weight loss with cachexia.   -Tube feeds were initiated on 7/24 at 15 mL/hr. Later that evening patient pulled out his DHT.  -Per GI note from attempted PEG placement on 7/24 there was evidence of Billroth II  gastrojejunostomy.  IR placed another DHT today. PEG tube was unable to be placed due to anatomy. IR will also be unable to place G- or J-tube. Patient was evaluated by surgery today. Plan is to complete work-up for incidental finding of lung lesion before deciding on proceeding with surgical placement of G- or J-tube. Met with patient in room. He had DHT in place with tube feeds infusing at 15 mL/hr. He reports he is tolerating well so far. He denied any N/V or abdominal pain. Able to complete NFPE today.  Enteral Access: Dobbhoff tube placed under fluoro today; terminates in partially resected stomach  Medications reviewed and include: Colace 100 mg BID, NS at 75 mL/hr.  Labs reviewed: CBG 117, BUN 24.  NUTRITION - FOCUSED PHYSICAL EXAM:    Most Recent Value  Orbital Region  Severe depletion  Upper Arm Region  Severe depletion  Thoracic and Lumbar Region  Severe depletion  Buccal Region  Severe depletion  Temple Region  Severe depletion  Clavicle Bone Region  Severe depletion  Clavicle and Acromion Bone Region  Severe depletion  Scapular Bone Region  Severe depletion  Dorsal Hand  Severe depletion  Patellar Region  Severe depletion  Anterior Thigh Region  Severe depletion  Posterior Calf Region  Severe depletion  Edema (RD Assessment)  None  Hair  Reviewed  Eyes  Reviewed  Mouth  Reviewed  Skin  Reviewed  Nails  Reviewed     Diet Order:   Diet Order            Diet NPO time specified  Diet effective now             EDUCATION NEEDS:   No education needs have been identified at this time  Skin:  Skin Assessment: Reviewed RN Assessment  Last BM:  05/10/2019 - smear type 5  Height:   Ht Readings from Last 1 Encounters:  04/21/2019 _0  (1.778 m)   Weight:   Wt Readings from Last 1 Encounters:  05/12/19 49.9 kg   Ideal Body Weight:  75.5 kg  BMI:  Body mass index is 15.78 kg/m.  Estimated Nutritional Needs:   Kcal:  1500-1700  Protein:  72-86  grams  Fluid:  1.5 L/day  Willey Blade, MS, RD, LDN Office: [phone] Pager: [phone] After Hours/Weekend Pager: [phone]

## 2019-05-12 NOTE — Care Management Important Message (Signed)
Important Message  Patient Details  Name: Brian Ferguson MRN: [id number] Date of Birth: [birth year]   Medicare Important Message Given:  Yes     [name] 05/12/2019, 11:29 AM

## 2019-05-13 DIAGNOSIS — R451 Restlessness and agitation: Secondary | ICD-10-CM

## 2019-05-13 DIAGNOSIS — Z515 Encounter for palliative care: Secondary | ICD-10-CM

## 2019-05-13 DIAGNOSIS — F329 Major depressive disorder, single episode, unspecified: Secondary | ICD-10-CM

## 2019-05-13 LAB — BASIC METABOLIC PANEL
Anion gap: 7 (ref 5–15)
BUN: 30 mg/dL — ABNORMAL HIGH (ref 8–23)
CO2: 26 mmol/L (ref 22–32)
Calcium: 8.3 mg/dL — ABNORMAL LOW (ref 8.9–10.3)
Chloride: 110 mmol/L (ref 98–111)
Creatinine, Ser: 0.61 mg/dL (ref 0.61–1.24)
GFR calc Af Amer: >60 mL/min (ref >60)
GFR calc non Af Amer: >60 mL/min (ref >60)
Glucose, Bld: 198 mg/dL — ABNORMAL HIGH (ref 70–99)
Potassium: 2.7 mmol/L — CL (ref 3.5–5.1)
Sodium: 143 mmol/L (ref 135–145)

## 2019-05-13 LAB — CBC
HCT: 37.7 % — ABNORMAL LOW (ref 39.0–52.0)
Hemoglobin: 11.5 g/dL — ABNORMAL LOW (ref 13.0–17.0)
MCH: 29.4 pg (ref 26.0–34.0)
MCHC: 30.5 g/dL (ref 30.0–36.0)
MCV: 96.4 fL (ref 80.0–100.0)
Platelets: 145 10*3/uL — ABNORMAL LOW (ref 150–400)
RBC: 3.91 MIL/uL — ABNORMAL LOW (ref 4.22–5.81)
RDW: 13.2 % (ref 11.5–15.5)
WBC: 12.5 10*3/uL — ABNORMAL HIGH (ref 4.0–10.5)
nRBC: 0 % (ref 0.0–0.2)

## 2019-05-13 LAB — PHOSPHORUS: Phosphorus: 1.7 mg/dL — ABNORMAL LOW (ref 2.5–4.6)

## 2019-05-13 LAB — MAGNESIUM: Magnesium: 2.1 mg/dL (ref 1.7–2.4)

## 2019-05-13 LAB — GLUCOSE, CAPILLARY: Glucose-Capillary: 128 mg/dL — ABNORMAL HIGH (ref 70–99)

## 2019-05-13 LAB — POTASSIUM: Potassium: 3.4 mmol/L — ABNORMAL LOW (ref 3.5–5.1)

## 2019-05-13 MED ORDER — POTASSIUM CHLORIDE 10 MEQ/100ML IV SOLN
10.0000 meq | INTRAVENOUS | Status: AC
  Administered 2019-05-13 (×3): 10 meq via INTRAVENOUS
  Filled 2019-05-13 (×3): qty 100

## 2019-05-13 MED ORDER — POTASSIUM CHLORIDE 10 MEQ/100ML IV SOLN
10.0000 meq | Freq: Once | INTRAVENOUS | Status: AC
  Administered 2019-05-13: 10 meq via INTRAVENOUS
  Filled 2019-05-13: qty 100

## 2019-05-13 MED ORDER — POTASSIUM CHLORIDE 10 MEQ/100ML IV SOLN
10.0000 meq | INTRAVENOUS | Status: AC
  Administered 2019-05-13 (×2): 10 meq via INTRAVENOUS
  Filled 2019-05-13 (×2): qty 100

## 2019-05-13 MED ORDER — POTASSIUM PHOSPHATES 15 MMOLE/5ML IV SOLN
10.0000 mmol | Freq: Once | INTRAVENOUS | Status: AC
  Administered 2019-05-13: 10 mmol via INTRAVENOUS
  Filled 2019-05-13: qty 3.33

## 2019-05-13 NOTE — Progress Notes (Signed)
PHARMACY CONSULT NOTE - FOLLOW UP  Pharmacy Consult for Electrolyte Monitoring and Replacement   Recent Labs: Potassium (mmol/L)  Date Value  05/13/2019 3.4 (L)  10/18/2011 4.0   Magnesium (mg/dL)  Date Value  05/13/2019 2.1   Calcium (mg/dL)  Date Value  05/13/2019 8.3 (L)   Calcium, Total (mg/dL)  Date Value  10/18/2011 9.1   Albumin (g/dL)  Date Value  04/28/2019 3.6   Phosphorus (mg/dL)  Date Value  05/13/2019 1.7 (L)   Sodium (mmol/L)  Date Value  05/13/2019 143  10/18/2011 138     Assessment: 85 year old male to restart tube feeds today. Pharmacy contacted by dietitian as patient is at risk of refeeding syndrome. MD consulted pharmacy to monitor electrolytes.  Goal of Therapy:  Electrolytes WNL  Plan:  K 3.4. Patient received potassium IV replacement for a total of 60 mEq. Unable to give supplement via tube as it is likely to clog per RN. Due to patient only have 1 accessible IV line, Potassium phosphate 10 mmol x 1 dose was started after last KCl bag. Will hold off on further supplementation at this time.  Phos 1.7. Will replace with potassium phosphate 10 mmol x 1. RN to hang after last run of potassium.   Electrolytes have been ordered with morning labs.  Pharmacy to follow and replace electrolytes as indicated.  Pearla Dubonnet ,PharmD Clinical Pharmacist 05/13/2019 8:10 PM

## 2019-05-13 NOTE — Consult Note (Signed)
[facility] Face-to-Face Psychiatry Consult   Reason for Consult:  Depression Referring Physician:  Hospitalist Patient Identification: Brian Ferguson MRN:  [id number] Principal Diagnosis: <principal problem not specified> Diagnosis:  Active Problems:   History of gastric cancer   Malnutrition ([place])   Cachexia ([place])   Dysphagia   Lung mass   Total Time spent with patient: 45 minutes  Subjective:   Brian Ferguson is a 85 y.o. male patient reports that he is doing okay today.  Patient states that he really wants something to drink but they have told him that he cannot have anything to drink because he had a surgery today and is planning on having another one tomorrow.  He reports that he did tell them that he was depressed but the only reason he told him he was depressed is because he wants some water.  Patient was also asked if he told them that he was suicidal and patient stated yes that he made that comment because he wanted some water.  Throughout the interview patient stated multiple times "I want some damn water."  Patient denies any history of depression or any suicidal thoughts or attempts.  Patient denies any psychiatric hospitalizations or medications.  Patient states that he stays at home and takes care of his disabled son.  Patient does make the comment that he would take a large [place] beer right now.  Patient does state that he has no thoughts of wanting to harm himself he is just upset because he has not received any water.  HPI:  Per Dr. Vianne Bulls: 85 y.o. male with a known history of gastric cancer in 1987 sent in by Dr. Bonna Gains for IV fluids, NG tube placement.  Patient had gastric cancer in 1987 and has been having progressive weight loss for last 6 to 7 months, patient told me that he lost about 40 pounds in the last 3 months associated with dysphagia.  Patient was seen by speech therapy recommended n.p.o., referred to gastroenterology.  Patient denies any nausea or abdominal pain.  And he  says that he likes to eat pizza and has no abdominal pain or dysphagia or difficulty swallowing.  Patient was evaluated by speech therapy and had trouble even with applesauce and therefore the recommended n.p.o. status.  Patient had a history of gastric cancer in 1987 status post to surgery two thirds of stomach removed along with submental stents by Dr. Pat Patrick 1989, patient did not have any follow-up with oncology, surgery or GI since then.  Patient went to GI clinic today for feeding tube placement but because of his weight loss, dysphagia with unknown cancer status patient is referred for admission.  Patient seen by this provider face-to-face.  Patient is irritable but cooperative and talkative today.  Patient does admit to making some comments about depression and suicidal ideations however he states that he was seen him for a simple fact that he is upset because he cannot have any water due to procedures that he has had been done on the medical floor.  Patient currently has an NG tube placed at this time.  Patient does seem to be fixated on getting water and it is making him extremely upset as he has not had water for several days.  He questioned if he was using mouth swabs and he did state "they are not worth a shit."  At this time the patient does not meet inpatient criteria and is psychiatrically cleared.  I have contacted Dr. Bridgett Larsson through secure chat and  notified him of the recommendations.  Past Psychiatric History: Denies  Risk to Self:   Risk to Others:   Prior Inpatient Therapy:   Prior Outpatient Therapy:    Past Medical History:  Past Medical History:  Diagnosis Date  . Cancer ([place])    stomach  . Glaucoma   . History of blood clots   . Malabsorption syndrome     Past Surgical History:  Procedure Laterality Date  . HERNIA REPAIR    . PEG PLACEMENT N/A 04/20/2019   Procedure: PERCUTANEOUS ENDOSCOPIC GASTROSTOMY (PEG) PLACEMENT;  Surgeon: Toledo, Benay Pike, MD;  Location: [facility]  ENDOSCOPY;  Service: Gastroenterology;  Laterality: N/A;  . STOMACH SURGERY     Family History: History reviewed. No pertinent family history. Family Psychiatric  History: Denies Social History:  Social History   Substance and Sexual Activity  Alcohol Use Not Currently     Social History   Substance and Sexual Activity  Drug Use No    Social History   Socioeconomic History  . Marital status: Widowed    Spouse name: Not on file  . Number of children: Not on file  . Years of education: Not on file  . Highest education level: Not on file  Occupational History  . Not on file  Social Needs  . Financial resource strain: Not on file  . Food insecurity    Worry: Not on file    Inability: Not on file  . Transportation needs    Medical: Not on file    Non-medical: Not on file  Tobacco Use  . Smoking status: Former Research scientist (life sciences)  . Smokeless tobacco: Former Network engineer and Sexual Activity  . Alcohol use: Not Currently  . Drug use: No  . Sexual activity: Not on file  Lifestyle  . Physical activity    Days per week: Not on file    Minutes per session: Not on file  . Stress: Not on file  Relationships  . Social Herbalist on phone: Not on file    Gets together: Not on file    Attends religious service: Not on file    Active member of club or organization: Not on file    Attends meetings of clubs or organizations: Not on file    Relationship status: Not on file  Other Topics Concern  . Not on file  Social History Narrative   Lives at home with son.   Additional Social History:    Allergies:  No Known Allergies  Labs:  Results for orders placed or performed during the hospital encounter of 05/10/2019 (from the past 48 hour(s))  Basic metabolic panel     Status: Abnormal   Collection Time: 05/12/19  7:56 AM  Result Value Ref Range   Sodium 143 135 - 145 mmol/L   Potassium 3.6 3.5 - 5.1 mmol/L   Chloride 109 98 - 111 mmol/L   CO2 22 22 - 32 mmol/L    Glucose, Bld 122 (H) 70 - 99 mg/dL   BUN 24 (H) 8 - 23 mg/dL   Creatinine, Ser 0.86 0.61 - 1.24 mg/dL   Calcium 8.8 (L) 8.9 - 10.3 mg/dL   GFR calc non Af Amer >60 >60 mL/min   GFR calc Af Amer >60 >60 mL/min   Anion gap 12 5 - 15    Comment: Performed at [facility], [address] [place], [place] [address]  Magnesium     Status: None   Collection Time:  05/12/19  7:56 AM  Result Value Ref Range   Magnesium 2.1 1.7 - 2.4 mg/dL    Comment: Performed at [facility], [address] [place], [place] [address]  CBC     Status: Abnormal   Collection Time: 05/13/19  4:15 AM  Result Value Ref Range   WBC 12.5 (H) 4.0 - 10.5 K/uL   RBC 3.91 (L) 4.22 - 5.81 MIL/uL   Hemoglobin 11.5 (L) 13.0 - 17.0 g/dL   HCT 37.7 (L) 39.0 - 52.0 %   MCV 96.4 80.0 - 100.0 fL   MCH 29.4 26.0 - 34.0 pg   MCHC 30.5 30.0 - 36.0 g/dL   RDW 13.2 11.5 - 15.5 %   Platelets 145 (L) 150 - 400 K/uL   nRBC 0.0 0.0 - 0.2 %    Comment: Performed at [facility], [address] [place], [place] [address]  Basic metabolic panel     Status: Abnormal   Collection Time: 05/13/19  4:15 AM  Result Value Ref Range   Sodium 143 135 - 145 mmol/L   Potassium 2.7 (LL) 3.5 - 5.1 mmol/L    Comment: CRITICAL RESULT CALLED TO, READ BACK BY AND VERIFIED WITH  JACKIE PAGE AT 0532 05/13/2019 SDR    Chloride 110 98 - 111 mmol/L   CO2 26 22 - 32 mmol/L   Glucose, Bld 198 (H) 70 - 99 mg/dL   BUN 30 (H) 8 - 23 mg/dL   Creatinine, Ser 0.61 0.61 - 1.24 mg/dL   Calcium 8.3 (L) 8.9 - 10.3 mg/dL   GFR calc non Af Amer >60 >60 mL/min   GFR calc Af Amer >60 >60 mL/min   Anion gap 7 5 - 15    Comment: Performed at [facility], [address] [place], [place] [address]  Magnesium     Status: None   Collection Time: 05/13/19  4:15 AM  Result Value Ref Range   Magnesium 2.1 1.7 - 2.4 mg/dL    Comment: Performed at [facility], [address] [place], [place] [address]  Phosphorus      Status: Abnormal   Collection Time: 05/13/19  4:15 AM  Result Value Ref Range   Phosphorus 1.7 (L) 2.5 - 4.6 mg/dL    Comment: Performed at [facility], [address] [place],  [address]  Glucose, capillary     Status: Abnormal   Collection Time: 05/13/19  8:02 AM  Result Value Ref Range   Glucose-Capillary 128 (H) 70 - 99 mg/dL    Current Facility-Administered Medications  Medication Dose Route Frequency Provider Last Rate Last Dose  . 0.9 %  sodium chloride infusion   Intravenous Continuous Epifanio Lesches, MD 75 mL/hr at 05/13/19 1443    . acetaminophen (TYLENOL) tablet 650 mg  650 mg Oral Q6H PRN Epifanio Lesches, MD       Or  . acetaminophen (TYLENOL) suppository 650 mg  650 mg Rectal Q6H PRN Epifanio Lesches, MD      . bisacodyl (DULCOLAX) EC tablet 5 mg  5 mg Oral Daily PRN Epifanio Lesches, MD      . docusate sodium (COLACE) capsule 100 mg  100 mg Oral BID Epifanio Lesches, MD      . feeding supplement (OSMOLITE 1.5 CAL) liquid 1,000 mL  1,000 mL Per Tube Continuous Sakai, Isami, DO 45 mL/hr at 05/13/19 1443 1,000 mL at 05/13/19 1443  . feeding supplement (PRO-STAT SUGAR FREE 64) liquid 30 mL  30  mL Per Tube Daily Sakai, Isami, DO      . free water 30 mL  30 mL Per Tube Q4H Mody, Sital, MD   30 mL at 05/13/19 0821  . heparin injection 5,000 Units  5,000 Units Subcutaneous Q8H Sakai, Isami, DO   5,000 Units at 05/13/19 7619  . iohexol (OMNIPAQUE) 240 MG/ML injection 50 mL  50 mL Oral Once PRN Epifanio Lesches, MD      . iohexol (OMNIPAQUE) 300 MG/ML solution 10 mL  10 mL Other Once PRN Demetrios Loll, MD      . latanoprost (XALATAN) 0.005 % ophthalmic solution 1 drop  1 drop Both Eyes QHS Demetrios Loll, MD   1 drop at 05/12/19 2150  . MEDLINE mouth rinse  15 mL Mouth Rinse BID Demetrios Loll, MD   15 mL at 05/12/19 2156  . multivitamin liquid 15 mL  15 mL Per Tube Daily Sakai, Isami, DO      . ondansetron (ZOFRAN) tablet 4 mg  4 mg Oral Q6H PRN  Epifanio Lesches, MD       Or  . ondansetron (ZOFRAN) injection 4 mg  4 mg Intravenous Q6H PRN Epifanio Lesches, MD   4 mg at 05/11/19 1734  . potassium chloride 10 mEq in 100 mL IVPB  10 mEq Intravenous Q1 Hr x 3 Ellington, Abby K, RPH 100 mL/hr at 05/13/19 1205 10 mEq at 05/13/19 1205  . potassium PHOSPHATE 10 mmol in dextrose 5 % 250 mL infusion  10 mmol Intravenous Once Ellington, Abby K, RPH      . traZODone (DESYREL) tablet 25 mg  25 mg Oral QHS PRN Epifanio Lesches, MD        Musculoskeletal: Strength & Muscle Tone: decreased Gait & Station: Remained in bed during evaluation Patient leans: N/A  Psychiatric Specialty Exam: Physical Exam  Nursing note and vitals reviewed. Constitutional: He is oriented to person, place, and time. He appears well-developed.  Cardiovascular: Normal rate.  Respiratory: Effort normal.  Musculoskeletal: Normal range of motion.  Neurological: He is alert and oriented to person, place, and time.    Review of Systems  Constitutional: Negative.   HENT: Negative.   Eyes: Negative.   Respiratory: Negative.   Genitourinary: Negative.   Musculoskeletal: Negative.   Skin: Negative.   Neurological: Negative.   Endo/Heme/Allergies: Negative.   Psychiatric/Behavioral: Positive for depression.    Blood pressure 101/83, pulse 77, temperature 97.8 F (36.6 C), temperature source Oral, resp. rate 16, height 5\' 10"  (1.778 m), weight 52.4 kg, SpO2 93 %.Body mass index is 16.57 kg/m.  General Appearance: Disheveled  Eye Contact:  Good  Speech:  Clear and Coherent and Normal Rate  Volume:  Normal  Mood:  Irritable  Affect:  Congruent  Thought Process:  Coherent, Goal Directed and Descriptions of Associations: Intact  Orientation:  Full (Time, Place, and Person)  Thought Content:  WDL  Suicidal Thoughts:  No  Homicidal Thoughts:  No  Memory:  Immediate;   Good Recent;   Good Remote;   Good  Judgement:  Good  Insight:  Good  Psychomotor  Activity:  Decreased  Concentration:  Concentration: Good  Recall:  Good  Fund of Knowledge:  Good  Language:  Good  Akathisia:  No  Handed:    AIMS (if indicated):     Assets:  Desire for Improvement Financial Resources/Insurance Housing Resilience  ADL's:  Impaired  Cognition:  WNL  Sleep:        Treatment Plan Summary: Patient  is agitated wih not being able to have water due to medical concerns. He denied any thoghts of self harm and is just upset about not getting water. I do not recommend any medications.  Disposition: No evidence of imminent risk to self or others at present.   Patient does not meet criteria for psychiatric inpatient admission. Supportive therapy provided about ongoing stressors.  [place], FNP 05/13/2019 2:45 PM

## 2019-05-13 NOTE — Consult Note (Signed)
[place]  Telephone:([phone] [phone]   Name: Cosmo Tetreault Date: 05/13/2019 MRN: [id number]  DOB: [birth year]  Patient Care Team: Olin Hauser, DO as PCP - General (Family Medicine)    REASON FOR CONSULTATION: Palliative Care consult requested for this 85 y.o. male with multiple medical problems including remote history of gastric cancer status post partial gastrectomy in 1987.  Patient has a recent history of significant weight loss over the past 4 to 6 months.  Patient underwent modified barium swallow study and was found to have severe dysphasia with aspiration and alternative nutrition was recommended.  He was subsequently referred to GI who recommended the patient be hospitalized for work-up.  Patient was admitted to the hospital on 04/19/2019 for same.  CT of the chest, abdomen, and pelvis on 05/08/2019 revealed a right upper lobe pulmonary mass concerning for malignancy.  Patient has been seen in consultation by medical oncology but is felt not to be a treatment candidate due to frailty and nutritional status.  Patient is status post EGD on 05/08/2019 with PEG attempted but insertion was not successful due to post gastrectomy anatomy.  Patient is pending surgical J-tube.  He was referred to palliative care to help address goals.  SOCIAL HISTORY:     reports that he has quit smoking. He has quit using smokeless tobacco. He reports previous alcohol use. He reports that he does not use drugs.  Patient is a widower. He lives at home with his son. Patient has a daughter who lives nearby and another daughter who lives at [facility].  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  He was born in the [place] but mostly lived in [place].  He moved here 7 years ago to be near family and for lower cost of living.  ADVANCE DIRECTIVES:  Patient has named his daughters to be his healthcare powers of attorney.  He  thinks he has a living will.  These documents are not on file.  CODE STATUS: Full Code  PAST MEDICAL HISTORY: Past Medical History:  Diagnosis Date   Cancer ([place])    stomach   Glaucoma    History of blood clots    Malabsorption syndrome     PAST SURGICAL HISTORY:  Past Surgical History:  Procedure Laterality Date   HERNIA REPAIR     PEG PLACEMENT N/A 04/25/2019   Procedure: PERCUTANEOUS ENDOSCOPIC GASTROSTOMY (PEG) PLACEMENT;  Surgeon: Toledo, Benay Pike, MD;  Location: [facility] ENDOSCOPY;  Service: Gastroenterology;  Laterality: N/A;   STOMACH SURGERY      HEMATOLOGY/ONCOLOGY HISTORY:  Oncology History   No history exists.    ALLERGIES:  has No Known Allergies.  MEDICATIONS:  Current Facility-Administered Medications  Medication Dose Route Frequency Provider Last Rate Last Dose   0.9 %  sodium chloride infusion   Intravenous Continuous Epifanio Lesches, MD 75 mL/hr at 05/13/19 1602     acetaminophen (TYLENOL) tablet 650 mg  650 mg Oral Q6H PRN Epifanio Lesches, MD       Or   acetaminophen (TYLENOL) suppository 650 mg  650 mg Rectal Q6H PRN Epifanio Lesches, MD       bisacodyl (DULCOLAX) EC tablet 5 mg  5 mg Oral Daily PRN Epifanio Lesches, MD       docusate sodium (COLACE) capsule 100 mg  100 mg Oral BID Epifanio Lesches, MD       feeding supplement (OSMOLITE 1.5 CAL) liquid 1,000 mL  1,000  mL Per Tube Continuous Sakai, Isami, DO 45 mL/hr at 05/13/19 1443 1,000 mL at 05/13/19 1443   feeding supplement (PRO-STAT SUGAR FREE 64) liquid 30 mL  30 mL Per Tube Daily Sakai, Isami, DO   30 mL at 05/13/19 1604   free water 30 mL  30 mL Per Tube Q4H Mody, Sital, MD   30 mL at 05/13/19 1602   heparin injection 5,000 Units  5,000 Units Subcutaneous Q8H Sakai, Isami, DO   5,000 Units at 05/13/19 7209   iohexol (OMNIPAQUE) 240 MG/ML injection 50 mL  50 mL Oral Once PRN Epifanio Lesches, MD       iohexol (OMNIPAQUE) 300 MG/ML solution 10 mL  10 mL  Other Once PRN Demetrios Loll, MD       latanoprost (XALATAN) 0.005 % ophthalmic solution 1 drop  1 drop Both Eyes QHS Demetrios Loll, MD   1 drop at 05/12/19 2150   MEDLINE mouth rinse  15 mL Mouth Rinse BID Demetrios Loll, MD   15 mL at 05/13/19 1200   ondansetron (ZOFRAN) tablet 4 mg  4 mg Oral Q6H PRN Epifanio Lesches, MD       Or   ondansetron [facility]) injection 4 mg  4 mg Intravenous Q6H PRN Epifanio Lesches, MD   4 mg at 05/11/19 1734   potassium PHOSPHATE 10 mmol in dextrose 5 % 250 mL infusion  10 mmol Intravenous Once Tawnya Crook, RPH 42 mL/hr at 05/13/19 1602     traZODone (DESYREL) tablet 25 mg  25 mg Oral QHS PRN Epifanio Lesches, MD        VITAL SIGNS: BP 100/78 (BP Location: Right Arm)    Pulse 78    Temp 97.7 F (36.5 C) (Oral)    Resp 16    Ht 5' 10" (1.778 m)    Wt 115 lb 8 oz (52.4 kg)    SpO2 94%    BMI 16.57 kg/m  Filed Weights   05/11/19 0524 05/12/19 0445 05/13/19 0622  Weight: 105 lb 13.1 oz (48 kg) 110 lb 0.2 oz (49.9 kg) 115 lb 8 oz (52.4 kg)    Estimated body mass index is 16.57 kg/m as calculated from the following:   Height as of this encounter: 5' 10" (1.778 m).   Weight as of this encounter: 115 lb 8 oz (52.4 kg).  LABS: CBC:    Component Value Date/Time   WBC 12.5 (H) 05/13/2019 0415   HGB 11.5 (L) 05/13/2019 0415   HGB 13.8 10/18/2011 1442   HCT 37.7 (L) 05/13/2019 0415   HCT 43.3 10/18/2011 1442   PLT 145 (L) 05/13/2019 0415   PLT 157 10/18/2011 1442   MCV 96.4 05/13/2019 0415   MCV 94 10/18/2011 1442   NEUTROABS 6,909 03/18/2019 1122   NEUTROABS 4.0 10/18/2011 1442   LYMPHSABS 1,513 03/18/2019 1122   LYMPHSABS 1.5 10/18/2011 1442   MONOABS 0.7 10/18/2011 1442   EOSABS 47 03/18/2019 1122   EOSABS 0.1 10/18/2011 1442   BASOSABS 38 03/18/2019 1122   BASOSABS 0.0 10/18/2011 1442   Comprehensive Metabolic Panel:    Component Value Date/Time   NA 143 05/13/2019 0415   NA 138 10/18/2011 1442   K 2.7 (LL) 05/13/2019 0415   K  4.0 10/18/2011 1442   CL 110 05/13/2019 0415   CL 101 10/18/2011 1442   CO2 26 05/13/2019 0415   CO2 31 10/18/2011 1442   BUN 30 (H) 05/13/2019 0415   BUN 24 (H) 10/18/2011 1442  CREATININE 0.61 05/13/2019 0415  ° CREATININE 0.83 03/18/2019 1122  ° GLUCOSE 198 (H) 05/13/2019 0415  ° GLUCOSE 106 (H) 10/18/2011 1442  ° CALCIUM 8.3 (L) 05/13/2019 0415  ° CALCIUM 9.1 10/18/2011 1442  ° AST 22 05/01/2019 1416  ° ALT 17 05/08/2019 1416  ° ALKPHOS 62 04/22/2019 1416  ° BILITOT 0.8 04/28/2019 1416  ° PROT 6.9 04/23/2019 1416  ° ALBUMIN 3.6 04/16/2019 1416  ° ° °RADIOGRAPHIC STUDIES: °Ct Chest W Contrast ° °Result Date: 05/08/2019 °CLINICAL DATA:  Remote history of gastric cancer. Patient is having progressive weight loss. Symptoms of dysphagia. EXAM: CT CHEST, ABDOMEN, AND PELVIS WITH CONTRAST TECHNIQUE: Multidetector CT imaging of the chest, abdomen and pelvis was performed following the standard protocol during bolus administration of intravenous contrast. CONTRAST:  75mL OMNIPAQUE IOHEXOL 300 MG/ML  SOLN COMPARISON:  None. FINDINGS: CT CHEST FINDINGS Cardiovascular: The heart is normal in size. There is age-related tortuosity, ectasia and calcification of the thoracic aorta but no dissection or focal aneurysm. The branch vessels are patent. Moderate three-vessel coronary artery calcifications are noted. Mediastinum/Nodes: No mediastinal or hilar mass or lymphadenopathy. The esophagus is grossly normal. Lungs/Pleura: There is a 2.5 x 1.4 x 1.3 cm lesion in the right upper lobe suspicious for neoplasm but possibly scar tissue. Without prior studies for comparison PET-CT may be helpful for further evaluation. Patchy areas of tree-in-bud opacity in the left upper lobe and left lower lobe suggesting chronic inflammation or possibly atypical infection such as MAC. Endobronchial debris in the right lower lobe along with peribronchial thickening may suggest aspiration. No right lower lobe pneumonia. Patchy atelectasis or  scarring change noted in the superior segment of the left lower lobe. Musculoskeletal: Fairly marked cachexia. No chest wall mass or axillary adenopathy. The bony structures are intact.  No bone lesions are identified. CT ABDOMEN PELVIS FINDINGS Hepatobiliary: Numerous hepatic cysts. No worrisome hepatic lesions or intrahepatic biliary dilatation. Pancreas: No mass, inflammation or ductal dilatation. Moderate pancreatic atrophy. Spleen: Normal size.  No worrisome lesions. Adrenals/Urinary Tract: The adrenal glands and kidneys are unremarkable. A right renal cyst is noted with some thin peripheral calcifications. No hydronephrosis. The bladder is grossly normal. Stomach/Bowel: Surgical changes noted involving the stomach but I do not see any findings suspicious for recurrent mass. The small bowel and colon are grossly normal. Vascular/Lymphatic: Marked tortuosity and mild ectasia of the abdominal aorta with moderate atherosclerotic calcifications but no focal aneurysm or dissection. The branch vessels are patent. There is marked tortuosity, ectasia and calcification of the iliac arteries. 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 No obvious mesenteric or retroperitoneal mass or adenopathy. Reproductive: The prostate gland and seminal vesicles are grossly normal. Other: No pelvic mass or free pelvic fluid collections. Musculoskeletal: Scoliosis and advanced degenerative lumbar spondylosis but no worrisome bone lesions. IMPRESSION: 1. Worrisome right upper lobe pulmonary lesion measuring 2.5 x 1.4 x 1.3 cm. PET-CT may be helpful for further evaluation. No mediastinal or hilar lymphadenopathy or evidence of metastatic disease elsewhere. 2. Patchy areas of tree-in-bud appearance suggesting chronic inflammation or atypical infection such as MAC. 3. Moderate amount of endobronchial debris in the right lower lobe along with peribronchial thickening possibly due to aspiration but no  definite findings for pneumonia. 4. Numerous hepatic cysts but no worrisome hepatic lesions. There is also a complex upper pole right renal cyst with areas of peripheral calcification but no solid enhancing components. 5. Advanced vascular disease. 6. Surgical changes from gastric surgery   without complicating features. I do not see any definite findings for recurrent gastric mass. Electronically Signed   By: P.  Gallerani M.D.   On: 05/08/2019 08:51  ° °Ct Abdomen Pelvis W Contrast ° °Result Date: 05/08/2019 °CLINICAL DATA:  Remote history of gastric cancer. Patient is having progressive weight loss. Symptoms of dysphagia. EXAM: CT CHEST, ABDOMEN, AND PELVIS WITH CONTRAST TECHNIQUE: Multidetector CT imaging of the chest, abdomen and pelvis was performed following the standard protocol during bolus administration of intravenous contrast. CONTRAST:  75mL OMNIPAQUE IOHEXOL 300 MG/ML  SOLN COMPARISON:  None. FINDINGS: CT CHEST FINDINGS Cardiovascular: The heart is normal in size. There is age-related tortuosity, ectasia and calcification of the thoracic aorta but no dissection or focal aneurysm. The branch vessels are patent. Moderate three-vessel coronary artery calcifications are noted. Mediastinum/Nodes: No mediastinal or hilar mass or lymphadenopathy. The esophagus is grossly normal. Lungs/Pleura: There is a 2.5 x 1.4 x 1.3 cm lesion in the right upper lobe suspicious for neoplasm but possibly scar tissue. Without prior studies for comparison PET-CT may be helpful for further evaluation. Patchy areas of tree-in-bud opacity in the left upper lobe and left lower lobe suggesting chronic inflammation or possibly atypical infection such as MAC. Endobronchial debris in the right lower lobe along with peribronchial thickening may suggest aspiration. No right lower lobe pneumonia. Patchy atelectasis or scarring change noted in the superior segment of the left lower lobe. Musculoskeletal: Fairly marked cachexia. No chest wall  mass or axillary adenopathy. The bony structures are intact.  No bone lesions are identified. CT ABDOMEN PELVIS FINDINGS Hepatobiliary: Numerous hepatic cysts. No worrisome hepatic lesions or intrahepatic biliary dilatation. Pancreas: No mass, inflammation or ductal dilatation. Moderate pancreatic atrophy. Spleen: Normal size.  No worrisome lesions. Adrenals/Urinary Tract: The adrenal glands and kidneys are unremarkable. A right renal cyst is noted with some thin peripheral calcifications. No hydronephrosis. The bladder is grossly normal. Stomach/Bowel: Surgical changes noted involving the stomach but I do not see any findings suspicious for recurrent mass. The small bowel and colon are grossly normal. Vascular/Lymphatic: Marked tortuosity and mild ectasia of the abdominal aorta with moderate atherosclerotic calcifications but no focal aneurysm or dissection. The branch vessels are patent. There is marked tortuosity, ectasia and calcification of the iliac arteries. 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 No obvious mesenteric or retroperitoneal mass or adenopathy. Reproductive: The prostate gland and seminal vesicles are grossly normal. Other: No pelvic mass or free pelvic fluid collections. Musculoskeletal: Scoliosis and advanced degenerative lumbar spondylosis but no worrisome bone lesions. IMPRESSION: 1. Worrisome right upper lobe pulmonary lesion measuring 2.5 x 1.4 x 1.3 cm. PET-CT may be helpful for further evaluation. No mediastinal or hilar lymphadenopathy or evidence of metastatic disease elsewhere. 2. Patchy areas of tree-in-bud appearance suggesting chronic inflammation or atypical infection such as MAC. 3. Moderate amount of endobronchial debris in the right lower lobe along with peribronchial thickening possibly due to aspiration but no definite findings for pneumonia. 4. Numerous hepatic cysts but no worrisome hepatic lesions. There is also a complex upper pole  right renal cyst with areas of peripheral calcification but no solid enhancing components. 5. Advanced vascular disease. 6. Surgical changes from gastric surgery without complicating features. I do not see any definite findings for recurrent gastric mass. Electronically Signed   By: P.  Gallerani M.D.   On: 05/08/2019 08:51  ° °Dg Swallow Func Op [employer] Speech Path ° °Result Date: 05/01/2019 °CLINICAL DATA:  Weight loss.    Dysphagia. EXAM: MODIFIED BARIUM SWALLOW TECHNIQUE: Different consistencies of barium were administered orally to the patient by the Speech Pathologist. Imaging of the pharynx was performed in the lateral projection. The radiologist was present in the fluoroscopy room for this study, providing personal supervision. FLUOROSCOPY TIME:  Fluoroscopy Time:  1 minutes Number of Acquired Spot Images: 0 COMPARISON:  None. FINDINGS: There was aspiration during the study. Please see the speech pathologist's report for full details. IMPRESSION: There was aspiration during the study. Please see the speech pathologist's report for full details. Please refer to the Speech Pathologists report for complete details and recommendations. Electronically Signed   By: David  Williams III M.D   On: 05/01/2019 14:08  ° °Dg Naso G Tube Plc W/fl W/rad ° °Result Date: 05/12/2019 °CLINICAL DATA:  Malnutrition, tube placement EXAM: NASO G TUBE PLACEMENT WITH FL AND WITH RAD CONTRAST:  10 mL Isovue-300 FLUOROSCOPY TIME:  Fluoroscopy Time:  00:30 Number of Acquired Spot Images: 1 COMPARISON:  None. FINDINGS: Using lidocaine jelly for local anesthesia, a weighted enteric feeding tube was advanced through the right nostril to the partially resected stomach under fluoroscopic guidance without complication. A small volume of water soluble contrast was injected to confirm intragastric tube location and the tube was secured. IMPRESSION: Successful fluoroscopic placement of weighted enteric feeding tube within the partially resected  stomach. Electronically Signed   By: Alex  Bibbey M.D.   On: 05/12/2019 09:18  ° °Dg Naso G Tube Plc W/fl W/rad ° °Result Date: 05/11/2019 °CLINICAL DATA:  Feeding tube needed. EXAM: NASO G TUBE PLACEMENT WITH FL AND WITH RAD CONTRAST:  None. FLUOROSCOPY TIME:  Fluoroscopy Time:  4 minutes 42 seconds Radiation Exposure Index (if provided by the fluoroscopic device): 43.0 mGy COMPARISON:  CT 04/26/2019. FINDINGS: A feeding tube was placed through the left nares down the esophagus into the stomach under fluoroscopic guidance. There were no complications. IMPRESSION: Successful feeding tube placement. Electronically Signed   By: Thomas  Register   On: 05/05/2019 14:56  ° °Dg Foot Complete Right ° °Result Date: 05/02/2019 °Please see detailed radiograph report in office note. ° ° °PERFORMANCE STATUS (ECOG) : 3 ° °Review of Systems °Unless otherwise noted, a complete review of systems is negative. ° °Physical Exam °General: NAD, frail appearing, thin, cachectic °Pulmonary: unlabored °Extremities: no edema °Skin: no rashes °Neurological: Weakness but otherwise nonfocal °Psych: A&O x 3, h/o SI ° °IMPRESSION: °I met with patient to discuss goals.  Introduced palliative care services and attempted to establish therapeutic rapport. ° °Patient is able to readily relate to me his multiple medical problems and the current plan for surgical insertion of a J-tube tomorrow.  He verbalized frustration with his order for n.p.o. Patient says he intends to eat and drink when he returns home despite clearly verbalizing an understanding of the risks associated with aspiration.  Patient says "I could die."  He says that not eating and drinking is "not living."   ° °We did discuss the option of comfort feedings and assuming the risks inherent with oral intake in the setting of severe dysphagia.  We talked about the option of hospice at home but patient was undecided.  He seemed interested in the idea of eating and drinking but also wanted to  pursue future hospitalization with treatment including ICU care if needed. He also wanted to remain a full code.  ° °Patient endorses depression.  He reports a recent history of suicidal ideation.  He has guns in the home and has   thought about shooting himself.  Patient does deny current SI/HI.  At baseline, patient reports being functionally independent in the home and able to provide for all of his own care including bathing and dressing.  He tells me that his oral intake was quite good prior to this hospitalization and he seemed unclear as to why he was losing weight.  Patient said he ate several good meals per day and could eat an entire plate of linguine or 3-4 slices of pizza.  I called and spoke with patient's daughter, [name].  She says that patient has been significantly declining over the past several months.  He has reached a point where he has been unable to adequately care for himself in the home.  There have been several occasions where he has had incontinence.  Daughter says that the patient has most been getting one bath every week or so as he has refused to allow family to assist him.  He is mostly sedentary during the day in a chair.  I updated daughter on my conversation with patient.  She is in agreement with J-tube insertion in hopes that it will stabilize his weight loss.  She recognizes that the patient could have primary lung cancer but would likely not be a candidate for work-up or treatment given his frailty and poor performance status.    Daughter is fearful that patient's son will not be able to adequately care for him upon discharge from the hospital.  Apparently patient's son has active mental health issues including paranoid schizophrenia and bipolar disorder.  Daughter would like consideration of rehab and then would would be interested in hospice involvement at home. She plans to speak with her sister about this and then will talk to patient about his wishes.   Daughter was  shocked to learn that patient had requested to be a full code.  She cites his living will and says that family thought he wanted to be a DO NOT RESUSCITATE.  They plan to speak to him about decision-making.  Note that daughter has removed guns from the home.   PLAN: -Continue current scope of treatment (Full scope/Full code) -Recommend psych consult due to repeated reports of SI and to initiate treatment for depression -Recommend CT head to complete workup of dysphagia -Recommend RPR, HIV, Vit D, serum B12, and RBC folate to r/o other causes of FTT -CM/SW consult to help with disposition -Recommend rehab with palliative care vs home with hospice    Time Total: 75 minutes  Visit consisted of counseling and education dealing with the complex and emotionally intense issues of symptom management and palliative care in the setting of serious and potentially life-threatening illness.Greater than 50%  of this time was spent counseling and coordinating care related to the above assessment and plan.  Signed by: Altha Harm, PhD, NP-C [phone] (Work Cell)

## 2019-05-13 NOTE — Progress Notes (Addendum)
Subjective:  CC: Brian Ferguson is a 85 y.o. male  Hospital stay day 6, malnutrition  HPI: No acute issues overnight.  Asking for water  ROS:  General: Denies weight loss, weight gain, fatigue, fevers, chills, and night sweats. Heart: Denies chest pain, palpitations, racing heart, irregular heartbeat, leg pain or swelling, and decreased activity tolerance. Respiratory: Denies breathing difficulty, shortness of breath, wheezing, cough, and sputum. GI: Denies change in appetite, heartburn, nausea, vomiting, constipation, diarrhea, and blood in stool. GU: Denies difficulty urinating, pain with urinating, urgency, frequency, blood in urine.   Objective:   Temp:  [97.8 F (36.6 C)-98.1 F (36.7 C)] 97.8 F (36.6 C) (07/28 0410) Pulse Rate:  [77-81] 77 (07/28 0410) BP: (101-140)/(83-85) 101/83 (07/28 0410) SpO2:  [92 %-93 %] 93 % (07/28 0410) Weight:  [52.4 kg] 52.4 kg (07/28 0622)     Height: 5\' 10"  (177.8 cm) Weight: 52.4 kg BMI (Calculated): 16.57   Intake/Output this shift:   Intake/Output Summary (Last 24 hours) at 05/13/2019 1238 Last data filed at 05/13/2019 1102 Gross per 24 hour  Intake 3592.47 ml  Output 750 ml  Net 2842.47 ml    Constitutional :  alert, cooperative, appears stated age, cachectic and no distress  Respiratory:  clear to auscultation bilaterally  Cardiovascular:  regular rate and rhythm  Gastrointestinal: soft, non-tender; bowel sounds normal; no masses,  no organomegaly.   Skin: Cool and moist.   Psychiatric: Normal affect, non-agitated, not confused       LABS:  CMP Latest Ref Rng & Units 05/13/2019 05/12/2019 05/10/2019  Glucose 70 - 99 mg/dL 198(H) 122(H) 102(H)  BUN 8 - 23 mg/dL 30(H) 24(H) 18  Creatinine 0.61 - 1.24 mg/dL 0.61 0.86 0.50(L)  Sodium 135 - 145 mmol/L 143 143 137  Potassium 3.5 - 5.1 mmol/L 2.7(LL) 3.6 4.9  Chloride 98 - 111 mmol/L 110 109 104  CO2 22 - 32 mmol/L 26 22 21(L)  Calcium 8.9 - 10.3 mg/dL 8.3(L) 8.8(L) 8.6(L)  Total  Protein 6.5 - 8.1 g/dL - - -  Total Bilirubin 0.3 - 1.2 mg/dL - - -  Alkaline Phos 38 - 126 U/L - - -  AST 15 - 41 U/L - - -  ALT 0 - 44 U/L - - -   CBC Latest Ref Rng & Units 05/13/2019 05/08/2019 05/04/2019  WBC 4.0 - 10.5 K/uL 12.5(H) 6.7 8.1  Hemoglobin 13.0 - 17.0 g/dL 11.5(L) 11.9(L) 12.4(L)  Hematocrit 39.0 - 52.0 % 37.7(L) 38.4(L) 39.8  Platelets 150 - 400 K/uL 145(L) 177 194    RADS: n/a Assessment:   Cachexia, malnourishment secondary to dysphagia. Incidental lung lesion. Discussed case with oncology and they recommend maximizing nutritional status first in case lung lesion requires additional treatment. Recommended proceeding with feeding tube placement.  The risk of surgery include, but not limited to, malfunction of tube, bleeding, chronic pain, post-op infxn, post-op SBO or ileus, hernias, injury to surrounding organs, resection of bowel, re-anastamosis,  need for re-operation to address said risks. The risks of general anesthetic, if used, includes MI, CVA, sudden death or even reaction to anesthetic medications also discussed. Alternatives include continued dobhoff.  Benefits include preventing further decline in health and possible death.  Typical post-op recovery time of additional days in hospital for observation afterwards also discussed.  Also discussed how DNR status will be rescinded during the procedure.  The patient and daughter Brian Ferguson verbalized understanding and all questions were answered to the patient's satisfaction.  Scheduled for tomorrow.

## 2019-05-13 NOTE — Progress Notes (Signed)
Physical Therapy Treatment Patient Details Name: Brian Ferguson MRN: [id number] DOB: 05-29-1929 Today's Date: 05/13/2019    History of Present Illness  85 y.o. male with a known history of gastric cancer in 1987 sent in by Dr. Bonna Gains for IV fluids, NG tube placement.  Patient had gastric cancer in 1987 and has been having progressive weight loss for last 6 to 7 months.  Pt had NG tube placed 7/24 (unable to get PEG) and he pulled it out later that day, NG tube now back in place.     PT Comments    Pt motivated to work with PT as he has not been doing much activity, and though he was weaker and needed some assist with bed mobility (which he did not on eval) he was still able to circumambulate the nurses station despite feeling weaker and more limited. Pt continues to display impulsive and unsafe gait (forward flexed posture, shuffling cadence, inability to make adjustments despite consistent cuing).  Pt showed good effort but significant weakness with supine LE exercises; pt motivated but limited.     Follow Up Recommendations  Home health PT     Equipment Recommendations  None recommended by PT    Recommendations for Other Services       Precautions / Restrictions Precautions Precautions: Fall Restrictions Weight Bearing Restrictions: No    Mobility  Bed Mobility Overal bed mobility: Needs Assistance Bed Mobility: Supine to Sit;Sit to Supine     Supine to sit: Min assist Sit to supine: Min assist   General bed mobility comments: Pt showed good effort with getting to sitting and back to supine, ultimately needed more UE assist (from PT) to elevate trunk and then needed assist to get LEs back into bed and scoot up once in bed  Transfers Overall transfer level: Modified independent Equipment used: Rolling walker (2 wheeled)             General transfer comment: slighly elevated bed height, showed good effort but again unable to attain fully upright posture and b/l knees  remained bent along with excessive kyphotic posture  Ambulation/Gait Ambulation/Gait assistance: Min guard;Min assist Gait Distance (Feet): 200 Feet Assistive device: Rolling walker (2 wheeled)       General Gait Details: Pt is again able ambulate around the nurses' station, but he did need constant directional assist (vision issues) as he did veer toward (and into) obstacles during effort.  Also had issues with very stooped posture and though he did not have any LOBs he maintained forward lean and needed constant cuing to insure that walker didn't get too far ahead of him. Very choppy cadence b/l with inability to fully extend knees (R>L) during shuffling, inefficient gait.   Stairs             Wheelchair Mobility    Modified Rankin (Stroke Patients Only)       Balance Overall balance assessment: Needs assistance   Sitting balance-Leahy Scale: Good       Standing balance-Leahy Scale: Fair Standing balance comment: highly reliant on walker with forward flexed posture, poor awareness/tolerance                            Cognition Arousal/Alertness: Awake/alert Behavior During Therapy: WFL for tasks assessed/performed Overall Cognitive Status: Within Functional Limits for tasks assessed  Exercises General Exercises - Lower Extremity Ankle Circles/Pumps: AROM;10 reps Quad Sets: Strengthening;AROM;10 reps Heel Slides: Strengthening;10 reps(with resisted leg extensions) Hip ABduction/ADduction: Strengthening;AROM;10 reps Straight Leg Raises: AROM;10 reps(extension lag b/l)    General Comments        Pertinent Vitals/Pain Pain Assessment: No/denies pain    Home Living                      Prior Function            PT Goals (current goals can now be found in the care plan section) Progress towards PT goals: Not progressing toward goals - comment(pt more weak today, needed assist with  mobility)    Frequency    Min 2X/week      PT Plan Current plan remains appropriate    Co-evaluation              AM-PAC PT "6 Clicks" Mobility   Outcome Measure  Help needed turning from your back to your side while in a flat bed without using bedrails?: None Help needed moving from lying on your back to sitting on the side of a flat bed without using bedrails?: A Little Help needed moving to and from a bed to a chair (including a wheelchair)?: A Little Help needed standing up from a chair using your arms (e.g., wheelchair or bedside chair)?: A Little Help needed to walk in hospital room?: A Little Help needed climbing 3-5 steps with a railing? : A Little 6 Click Score: 19    End of Session Equipment Utilized During Treatment: Gait belt Activity Tolerance: Patient tolerated treatment well Patient left: with bed alarm set;with call bell/phone within reach   PT Visit Diagnosis: Muscle weakness (generalized) (M62.81);Difficulty in walking, not elsewhere classified (R26.2)     Time: 0934-1000 PT Time Calculation (min) (ACUTE ONLY): 26 min  Charges:  $Gait Training: 8-22 mins $Therapeutic Exercise: 8-22 mins                     Kreg Shropshire, DPT 05/13/2019, 12:31 PM

## 2019-05-13 NOTE — Progress Notes (Signed)
PHARMACY CONSULT NOTE - FOLLOW UP  Pharmacy Consult for Electrolyte Monitoring and Replacement   Recent Labs: Potassium (mmol/L)  Date Value  05/13/2019 2.7 (LL)  10/18/2011 4.0   Magnesium (mg/dL)  Date Value  05/13/2019 2.1   Calcium (mg/dL)  Date Value  05/13/2019 8.3 (L)   Calcium, Total (mg/dL)  Date Value  10/18/2011 9.1   Albumin (g/dL)  Date Value  05/08/2019 3.6   Phosphorus (mg/dL)  Date Value  05/13/2019 1.7 (L)   Sodium (mmol/L)  Date Value  05/13/2019 143  10/18/2011 138     Assessment: 85 year old male to restart tube feeds today. Pharmacy contacted by dietitian as patient is at risk of refeeding syndrome. MD consulted pharmacy to monitor electrolytes.  Goal of Therapy:  Electrolytes WNL  Plan:  K 2.7. Patient to receive potassium IV replacement for a total of 60 mEq. Unable to give supplement via tube as it is likely to clog per RN. Will recheck potassium today at 1900.  Phos 1.7. Will replace with potassium phosphate 10 mmol x 1. RN to hang after last run of potassium.  Electrolytes have been ordered with morning labs.  Pharmacy to follow and replace electrolytes as indicated.  Tawnya Crook ,PharmD Clinical Pharmacist 05/13/2019 2:53 PM

## 2019-05-13 NOTE — Progress Notes (Signed)
[place] [facility] [place] NAME: Brian Ferguson    MR#:  [id number]  DATE OF BIRTH:  11-02-1928  SUBJECTIVE:   The patient tolerated tube feeding. REVIEW OF SYSTEMS:    Review of Systems  Constitutional: Negative for fever, chills ++ poor appetite with weight loss HENT: Negative for ear pain, nosebleeds, congestion, facial swelling, rhinorrhea, neck pain, neck stiffness and ear discharge.   Respiratory: Negative for cough, shortness of breath, wheezing  Cardiovascular: Negative for chest pain, palpitations and leg swelling.  Gastrointestinal: Negative for heartburn, abdominal pain, vomiting, diarrhea or consitpation Genitourinary: Negative for dysuria, urgency, frequency, hematuria Musculoskeletal: Negative for back pain or joint pain Neurological: Negative for dizziness, seizures, syncope, focal weakness,  numbness and headaches.  Hematological: Does not bruise/bleed easily.  Psychiatric/Behavioral: Negative for hallucinations, confusion, dysphoric mood Tolerating Diet: npo DRUG ALLERGIES:  No Known Allergies  VITALS:  Blood pressure 101/83, pulse 77, temperature 97.8 F (36.6 C), temperature source Oral, resp. rate 16, height 5\' 10"  (1.778 m), weight 52.4 kg, SpO2 93 %.  PHYSICAL EXAMINATION:  Constitutional: Appears frail thin cachectic no distress. HENT: Normocephalic. Eyes: Conjunctivae and EOM are normal. PERRLA, no scleral icterus.  Neck: Normal ROM. Neck supple. No JVD. No tracheal deviation. CVS: RRR, S1/S2 +, no murmurs, no gallops, no carotid bruit.  Pulmonary: Effort and breath sounds normal, no stridor, rhonchi, wheezes, rales.  Abdominal: Soft. BS +,  no distension, tenderness, rebound or guarding.  Musculoskeletal: Normal range of motion. No edema and no tenderness.  Neuro: Alert. CN 2-12 grossly intact. No focal deficits. Skin: Skin is warm and dry. No rash noted. Psychiatric: Normal mood and affect.  LABORATORY PANEL:    CBC Recent Labs  Lab 05/13/19 0415  WBC 12.5*  HGB 11.5*  HCT 37.7*  PLT 145*   ------------------------------------------------------------------------------------------------------------------  Chemistries  Recent Labs  Lab 04/23/2019 1416  05/13/19 0415  NA 142   < > 143  K 4.1   < > 2.7*  CL 105   < > 110  CO2 29   < > 26  GLUCOSE 104*   < > 198*  BUN 35*   < > 30*  CREATININE 0.74   < > 0.61  CALCIUM 9.3   < > 8.3*  MG  --    < > 2.1  AST 22  --   --   ALT 17  --   --   ALKPHOS 62  --   --   BILITOT 0.8  --   --    < > = values in this interval not displayed.   ------------------------------------------------------------------------------------------------------------------  Cardiac Enzymes No results for input(s): TROPONINI in the last 168 hours. ------------------------------------------------------------------------------------------------------------------  RADIOLOGY:  Dg Naso G Tube Plc W/fl W/rad  Result Date: 05/12/2019 CLINICAL DATA:  Malnutrition, tube placement EXAM: NASO G TUBE PLACEMENT WITH FL AND WITH RAD CONTRAST:  10 mL Isovue-300 FLUOROSCOPY TIME:  Fluoroscopy Time:  00:30 Number of Acquired Spot Images: 1 COMPARISON:  None. FINDINGS: Using lidocaine jelly for local anesthesia, a weighted enteric feeding tube was advanced through the right nostril to the partially resected stomach under fluoroscopic guidance without complication. A small volume of water soluble contrast was injected to confirm intragastric tube location and the tube was secured. IMPRESSION: Successful fluoroscopic placement of weighted enteric feeding tube within the partially resected stomach. Electronically Signed   By: Eddie Candle M.D.   On: 05/12/2019 09:18   ASSESSMENT AND PLAN:   85 year old  male with history of gastric cancer and two thirds of stomach removed who presented from GI office as a direct admit due to dysphasia and weight loss.  1.  Severe malnutrition with  severe cachexia, weight loss and dysphasia: S/p  dobhoff tube placement but pulled off by patient. History of gastric cancer.  S/p surgery. Per radiologist, it is very difficult to get a J-tube placement.  He placed weighted enteric feeding tube for tube feeding.  The patient tolerated tube feeding.  Watch for refeeding syndrome. Dr. Alice Reichert requested a surgical consult.  Per Dr. Lysle Pearl, surgical placement of feeding tube tomorrow.  Right upper lobe pulmonary lesion. Work-up after better nutrition status per Dr. Tasia Catchings..  Hypokalemia.  Potassium supplement. Hypophosphatemia.  Supplement.  PT consult; HHPT.  Patient is currently followed by outpatient Palliative at home. I discussed with Dr. Lysle Pearl. I discussed with the patient's daughter [name]. Management plans discussed with the patient and he is in agreement.  CODE STATUS: FULL  TOTAL TIME TAKING CARE OF THIS PATIENT: 32 minutes. POSSIBLE D/C 2 days, DEPENDING ON CLINICAL CONDITION.  Demetrios Loll M.D on 05/13/2019 at 12:50 PM  Between 7am to 6pm - Pager - [phone] After 6pm go to [email or web address] - password [email or web address] [facility]  Office  [phone]  CC: Primary care physician; Olin Hauser, DO  Note: This dictation was prepared with Dragon dictation along with smaller phrase technology. Any transcriptional errors that result from this process are unintentional.

## 2019-05-14 ENCOUNTER — Encounter: Payer: Self-pay | Admitting: *Deleted

## 2019-05-14 ENCOUNTER — Inpatient Hospital Stay: Payer: Medicare Other | Admitting: Anesthesiology

## 2019-05-14 ENCOUNTER — Encounter: Admission: AD | Disposition: E | Payer: Self-pay | Source: Ambulatory Visit | Attending: Internal Medicine

## 2019-05-14 DIAGNOSIS — E43 Unspecified severe protein-calorie malnutrition: Secondary | ICD-10-CM | POA: Insufficient documentation

## 2019-05-14 DIAGNOSIS — R45851 Suicidal ideations: Secondary | ICD-10-CM

## 2019-05-14 HISTORY — PX: GASTROSTOMY: SHX5249

## 2019-05-14 LAB — BASIC METABOLIC PANEL
Anion gap: 6 (ref 5–15)
BUN: 28 mg/dL — ABNORMAL HIGH (ref 8–23)
CO2: 27 mmol/L (ref 22–32)
Calcium: 8.2 mg/dL — ABNORMAL LOW (ref 8.9–10.3)
Chloride: 112 mmol/L — ABNORMAL HIGH (ref 98–111)
Creatinine, Ser: 0.53 mg/dL — ABNORMAL LOW (ref 0.61–1.24)
GFR calc Af Amer: >60 mL/min (ref >60)
GFR calc non Af Amer: >60 mL/min (ref >60)
Glucose, Bld: 125 mg/dL — ABNORMAL HIGH (ref 70–99)
Potassium: 3.2 mmol/L — ABNORMAL LOW (ref 3.5–5.1)
Sodium: 145 mmol/L (ref 135–145)

## 2019-05-14 LAB — CBC
HCT: 42.7 % (ref 39.0–52.0)
Hemoglobin: 13 g/dL (ref 13.0–17.0)
MCH: 29.8 pg (ref 26.0–34.0)
MCHC: 30.4 g/dL (ref 30.0–36.0)
MCV: 97.9 fL (ref 80.0–100.0)
Platelets: 161 10*3/uL (ref 150–400)
RBC: 4.36 MIL/uL (ref 4.22–5.81)
RDW: 13.3 % (ref 11.5–15.5)
WBC: 10.6 10*3/uL — ABNORMAL HIGH (ref 4.0–10.5)
nRBC: 0 % (ref 0.0–0.2)

## 2019-05-14 LAB — CREATININE, SERUM
Creatinine, Ser: 0.79 mg/dL (ref 0.61–1.24)
GFR calc Af Amer: >60 mL/min (ref >60)
GFR calc non Af Amer: >60 mL/min (ref >60)

## 2019-05-14 LAB — MAGNESIUM: Magnesium: 2 mg/dL (ref 1.7–2.4)

## 2019-05-14 LAB — PHOSPHORUS: Phosphorus: 1.3 mg/dL — ABNORMAL LOW (ref 2.5–4.6)

## 2019-05-14 LAB — GLUCOSE, CAPILLARY: Glucose-Capillary: 129 mg/dL — ABNORMAL HIGH (ref 70–99)

## 2019-05-14 SURGERY — INSERTION OF GASTROSTOMY TUBE
Anesthesia: General | Site: Abdomen

## 2019-05-14 MED ORDER — SODIUM CHLORIDE (PF) 0.9 % IJ SOLN
INTRAMUSCULAR | Status: AC
  Filled 2019-05-14: qty 50

## 2019-05-14 MED ORDER — SUGAMMADEX SODIUM 200 MG/2ML IV SOLN
INTRAVENOUS | Status: AC
  Filled 2019-05-14: qty 2

## 2019-05-14 MED ORDER — PROPOFOL 10 MG/ML IV BOLUS
INTRAVENOUS | Status: AC
  Filled 2019-05-14: qty 20

## 2019-05-14 MED ORDER — CEFAZOLIN SODIUM-DEXTROSE 2-3 GM-%(50ML) IV SOLR
INTRAVENOUS | Status: DC | PRN
  Administered 2019-05-14: 2 g via INTRAVENOUS

## 2019-05-14 MED ORDER — LIDOCAINE HCL (CARDIAC) PF 100 MG/5ML IV SOSY
PREFILLED_SYRINGE | INTRAVENOUS | Status: DC | PRN
  Administered 2019-05-14: 60 mg via INTRAVENOUS

## 2019-05-14 MED ORDER — PHENYLEPHRINE HCL (PRESSORS) 10 MG/ML IV SOLN
INTRAVENOUS | Status: DC | PRN
  Administered 2019-05-14: 100 ug via INTRAVENOUS
  Administered 2019-05-14: 200 ug via INTRAVENOUS
  Administered 2019-05-14 (×3): 100 ug via INTRAVENOUS
  Administered 2019-05-14: 200 ug via INTRAVENOUS
  Administered 2019-05-14 (×4): 100 ug via INTRAVENOUS

## 2019-05-14 MED ORDER — BUPIVACAINE HCL 0.25 % IJ SOLN
INTRAMUSCULAR | Status: DC | PRN
  Administered 2019-05-14: 20 mL

## 2019-05-14 MED ORDER — BUPIVACAINE HCL (PF) 0.25 % IJ SOLN
INTRAMUSCULAR | Status: AC
  Filled 2019-05-14: qty 30

## 2019-05-14 MED ORDER — SUCCINYLCHOLINE CHLORIDE 20 MG/ML IJ SOLN
INTRAMUSCULAR | Status: AC
  Filled 2019-05-14: qty 1

## 2019-05-14 MED ORDER — SUGAMMADEX SODIUM 200 MG/2ML IV SOLN
INTRAVENOUS | Status: DC | PRN
  Administered 2019-05-14: 150 mg via INTRAVENOUS

## 2019-05-14 MED ORDER — FENTANYL CITRATE (PF) 100 MCG/2ML IJ SOLN
INTRAMUSCULAR | Status: AC
  Filled 2019-05-14: qty 2

## 2019-05-14 MED ORDER — ROCURONIUM BROMIDE 50 MG/5ML IV SOLN
INTRAVENOUS | Status: AC
  Filled 2019-05-14: qty 1

## 2019-05-14 MED ORDER — FENTANYL CITRATE (PF) 100 MCG/2ML IJ SOLN
INTRAMUSCULAR | Status: DC | PRN
  Administered 2019-05-14 (×2): 50 ug via INTRAVENOUS

## 2019-05-14 MED ORDER — MORPHINE SULFATE (PF) 4 MG/ML IV SOLN: 2.0000 mg | INTRAVENOUS | Status: DC | PRN

## 2019-05-14 MED ORDER — ENOXAPARIN SODIUM 40 MG/0.4ML ~~LOC~~ SOLN
40.0000 mg | SUBCUTANEOUS | Status: DC
  Administered 2019-05-15 – 2019-05-23 (×9): 40 mg via SUBCUTANEOUS
  Filled 2019-05-14 (×9): qty 0.4

## 2019-05-14 MED ORDER — BUPIVACAINE LIPOSOME 1.3 % IJ SUSP
INTRAMUSCULAR | Status: DC | PRN
  Administered 2019-05-14: 20 mL

## 2019-05-14 MED ORDER — HYDROCODONE-ACETAMINOPHEN 5-325 MG PO TABS: 1.0000 | ORAL_TABLET | ORAL | Status: DC | PRN

## 2019-05-14 MED ORDER — LIDOCAINE HCL (PF) 2 % IJ SOLN
INTRAMUSCULAR | Status: AC
  Filled 2019-05-14: qty 10

## 2019-05-14 MED ORDER — BUPIVACAINE LIPOSOME 1.3 % IJ SUSP
INTRAMUSCULAR | Status: AC
  Filled 2019-05-14: qty 20

## 2019-05-14 MED ORDER — POTASSIUM CHLORIDE 10 MEQ/100ML IV SOLN
10.0000 meq | INTRAVENOUS | Status: AC
  Administered 2019-05-14 (×4): 10 meq via INTRAVENOUS
  Filled 2019-05-14 (×4): qty 100

## 2019-05-14 MED ORDER — ONDANSETRON HCL 4 MG/2ML IJ SOLN
INTRAMUSCULAR | Status: AC
  Filled 2019-05-14: qty 2

## 2019-05-14 MED ORDER — MORPHINE SULFATE (PF) 2 MG/ML IV SOLN: 1.0000 mg | INTRAVENOUS | Status: DC | PRN

## 2019-05-14 MED ORDER — LACTATED RINGERS IV SOLN
INTRAVENOUS | Status: DC | PRN
  Administered 2019-05-14: 18:00:00 via INTRAVENOUS

## 2019-05-14 MED ORDER — CHLORHEXIDINE GLUCONATE CLOTH 2 % EX PADS
6.0000 | MEDICATED_PAD | Freq: Every day | CUTANEOUS | Status: DC
  Administered 2019-05-14 – 2019-05-23 (×10): 6 via TOPICAL

## 2019-05-14 MED ORDER — POTASSIUM PHOSPHATES 15 MMOLE/5ML IV SOLN
20.0000 mmol | Freq: Once | INTRAVENOUS | Status: AC
  Administered 2019-05-14: 20 mmol via INTRAVENOUS
  Filled 2019-05-14: qty 6.67

## 2019-05-14 MED ORDER — SODIUM CHLORIDE 0.9 % IV SOLN
INTRAVENOUS | Status: DC
  Administered 2019-05-14: 22:00:00 via INTRAVENOUS

## 2019-05-14 MED ORDER — ONDANSETRON HCL 4 MG/2ML IJ SOLN
INTRAMUSCULAR | Status: DC | PRN
  Administered 2019-05-14 (×2): 4 mg via INTRAVENOUS

## 2019-05-14 MED ORDER — PROPOFOL 10 MG/ML IV BOLUS
INTRAVENOUS | Status: DC | PRN
  Administered 2019-05-14: 70 mg via INTRAVENOUS

## 2019-05-14 MED ORDER — ROCURONIUM BROMIDE 100 MG/10ML IV SOLN
INTRAVENOUS | Status: DC | PRN
  Administered 2019-05-14: 10 mg via INTRAVENOUS
  Administered 2019-05-14: 20 mg via INTRAVENOUS
  Administered 2019-05-14: 15 mg via INTRAVENOUS
  Administered 2019-05-14: 5 mg via INTRAVENOUS
  Administered 2019-05-14 (×3): 10 mg via INTRAVENOUS

## 2019-05-14 MED ORDER — SUCCINYLCHOLINE CHLORIDE 20 MG/ML IJ SOLN
INTRAMUSCULAR | Status: DC | PRN
  Administered 2019-05-14: 60 mg via INTRAVENOUS

## 2019-05-14 MED ORDER — SEVOFLURANE IN SOLN
RESPIRATORY_TRACT | Status: AC
  Filled 2019-05-14: qty 250

## 2019-05-14 SURGICAL SUPPLY — 45 items
CANISTER SUCT 1200ML W/VALVE (MISCELLANEOUS) ×3 IMPLANT
CHLORAPREP W/TINT 26 (MISCELLANEOUS) ×3 IMPLANT
COVER WAND RF STERILE (DRAPES) ×3 IMPLANT
DRAPE LAPAROTOMY 100X77 ABD (DRAPES) ×3 IMPLANT
DRSG OPSITE POSTOP 4X10 (GAUZE/BANDAGES/DRESSINGS) ×3 IMPLANT
DRSG OPSITE POSTOP 4X8 (GAUZE/BANDAGES/DRESSINGS) IMPLANT
DRSG TEGADERM 4X10 (GAUZE/BANDAGES/DRESSINGS) ×3 IMPLANT
DRSG TELFA 3X8 NADH (GAUZE/BANDAGES/DRESSINGS) ×3 IMPLANT
ELECT REM PT RETURN 9FT ADLT (ELECTROSURGICAL) ×3
ELECTRODE REM PT RTRN 9FT ADLT (ELECTROSURGICAL) ×1 IMPLANT
GLOVE BIO SURGEON STRL SZ 6.5 (GLOVE) ×6 IMPLANT
GLOVE BIO SURGEONS STRL SZ 6.5 (GLOVE) ×3
GLOVE BIOGEL PI IND STRL 6.5 (GLOVE) ×1 IMPLANT
GLOVE BIOGEL PI IND STRL 7.0 (GLOVE) ×2 IMPLANT
GLOVE BIOGEL PI INDICATOR 6.5 (GLOVE) ×2
GLOVE BIOGEL PI INDICATOR 7.0 (GLOVE) ×4
GLOVE INDICATOR 7.0 STRL GRN (GLOVE) ×3 IMPLANT
GLOVE SURG SYN 6.5 ES PF (GLOVE) ×6 IMPLANT
GOWN STRL REUS W/ TWL LRG LVL3 (GOWN DISPOSABLE) ×4 IMPLANT
GOWN STRL REUS W/TWL LRG LVL3 (GOWN DISPOSABLE) ×8
KIT TURNOVER KIT A (KITS) ×3 IMPLANT
LABEL OR SOLS (LABEL) ×3 IMPLANT
NDL HPO THNWL 1X22GA REG BVL (NEEDLE) ×1 IMPLANT
NEEDLE SAFETY 22GX1 (NEEDLE) ×2
NS IRRIG 1000ML POUR BTL (IV SOLUTION) ×3 IMPLANT
PACK BASIN MAJOR ARMC (MISCELLANEOUS) ×3 IMPLANT
PACK COLON CLEAN CLOSURE (MISCELLANEOUS) IMPLANT
SPONGE DRAIN TRACH 4X4 STRL 2S (GAUZE/BANDAGES/DRESSINGS) ×3 IMPLANT
SPONGE LAP 18X18 RF (DISPOSABLE) ×3 IMPLANT
STAPLER SKIN PROX 35W (STAPLE) ×3 IMPLANT
SUT PDS AB 0 CT1 27 (SUTURE) ×6 IMPLANT
SUT PDS AB 1 TP1 54 (SUTURE) ×6 IMPLANT
SUT PROLENE 2 0 FS (SUTURE) ×3 IMPLANT
SUT SILK 2 0 (SUTURE) ×2
SUT SILK 2-0 18XBRD TIE 12 (SUTURE) ×1 IMPLANT
SUT SILK 3 0 (SUTURE) ×2
SUT SILK 3-0 (SUTURE) ×6 IMPLANT
SUT SILK 3-0 18XBRD TIE 12 (SUTURE) ×1 IMPLANT
SUT VIC AB 3-0 SH 27 (SUTURE) ×4
SUT VIC AB 3-0 SH 27X BRD (SUTURE) ×2 IMPLANT
SYR 20ML LL LF (SYRINGE) ×3 IMPLANT
SYRINGE IRR TOOMEY STRL 70CC (SYRINGE) ×3 IMPLANT
TRAY FOLEY MTR SLVR 16FR STAT (SET/KITS/TRAYS/PACK) ×3 IMPLANT
TUBE JEJUNO 22X14 (TUBING) ×3 IMPLANT
WATER STERILE IRR 1000ML POUR (IV SOLUTION) ×3 IMPLANT

## 2019-05-14 NOTE — Progress Notes (Addendum)
Subjective:  CC: Brian Ferguson is a 85 y.o. male  Hospital stay day 7, malnutrition  HPI: No acute issues overnight.  Still asking for water  ROS:  General: Denies weight loss, weight gain, fatigue, fevers, chills, and night sweats. Heart: Denies chest pain, palpitations, racing heart, irregular heartbeat, leg pain or swelling, and decreased activity tolerance. Respiratory: Denies breathing difficulty, shortness of breath, wheezing, cough, and sputum. GI: Denies change in appetite, heartburn, nausea, vomiting, constipation, diarrhea, and blood in stool. GU: Denies difficulty urinating, pain with urinating, urgency, frequency, blood in urine.   Objective:   Temp:  [97.7 F (36.5 C)-99.3 F (37.4 C)] 99.3 F (37.4 C) (07/29 1434) Pulse Rate:  [77-81] 81 (07/29 1434) Resp:  [15-16] 15 (07/29 1434) BP: (100-134)/(70-86) 132/70 (07/29 1434) SpO2:  [93 %-96 %] 96 % (07/29 1434) Weight:  [53.5 kg] 53.5 kg (07/29 1434)     Height: 5\' 10"  (177.8 cm) Weight: 53.5 kg BMI (Calculated): 16.92   Intake/Output this shift:   Intake/Output Summary (Last 24 hours) at 04/25/2019 1451 Last data filed at 04/26/2019 1351 Gross per 24 hour  Intake 2617.89 ml  Output 1900 ml  Net 717.89 ml    Constitutional :  alert, cooperative, appears stated age, cachectic and no distress  Respiratory:  clear to auscultation bilaterally  Cardiovascular:  regular rate and rhythm  Gastrointestinal: soft, non-tender; bowel sounds normal; no masses,  no organomegaly.   Skin: Cool and moist.   Psychiatric: Normal affect, non-agitated, not confused       LABS:  CMP Latest Ref Rng & Units 05/15/2019 05/13/2019 05/13/2019  Glucose 70 - 99 mg/dL 125(H) - 198(H)  BUN 8 - 23 mg/dL 28(H) - 30(H)  Creatinine 0.61 - 1.24 mg/dL 0.53(L) - 0.61  Sodium 135 - 145 mmol/L 145 - 143  Potassium 3.5 - 5.1 mmol/L 3.2(L) 3.4(L) 2.7(LL)  Chloride 98 - 111 mmol/L 112(H) - 110  CO2 22 - 32 mmol/L 27 - 26  Calcium 8.9 - 10.3 mg/dL  8.2(L) - 8.3(L)  Total Protein 6.5 - 8.1 g/dL - - -  Total Bilirubin 0.3 - 1.2 mg/dL - - -  Alkaline Phos 38 - 126 U/L - - -  AST 15 - 41 U/L - - -  ALT 0 - 44 U/L - - -   CBC Latest Ref Rng & Units 05/13/2019 05/08/2019 05/16/2019  WBC 4.0 - 10.5 K/uL 12.5(H) 6.7 8.1  Hemoglobin 13.0 - 17.0 g/dL 11.5(L) 11.9(L) 12.4(L)  Hematocrit 39.0 - 52.0 % 37.7(L) 38.4(L) 39.8  Platelets 150 - 400 K/uL 145(L) 177 194    RADS: n/a Assessment:   Cachexia, malnourishment secondary to dysphagia. Incidental lung lesion. Discussed case with oncology and they recommend maximizing nutritional status first in case lung lesion requires additional treatment. Recommended proceeding with feeding tube placement.  Proceed to OR today for tube placement.  Attempted to call daughter, [name], but unable to reach via phone to ask if she had any last minute questions.  Will call her after procedure completed

## 2019-05-14 NOTE — Progress Notes (Signed)
[place] at [place] NAME: Brian Ferguson    MR#:  [id number]  DATE OF BIRTH:  [birth year]  SUBJECTIVE:   The patient wants to drink water.  He is upset about NPO. REVIEW OF SYSTEMS:    Review of Systems  Constitutional: Negative for fever, chills ++ poor appetite with weight loss HENT: Negative for ear pain, nosebleeds, congestion, facial swelling, rhinorrhea, neck pain, neck stiffness and ear discharge.   Respiratory: Negative for cough, shortness of breath, wheezing  Cardiovascular: Negative for chest pain, palpitations and leg swelling.  Gastrointestinal: Negative for heartburn, abdominal pain, vomiting, diarrhea or consitpation Genitourinary: Negative for dysuria, urgency, frequency, hematuria Musculoskeletal: Negative for back pain or joint pain Neurological: Negative for dizziness, seizures, syncope, focal weakness,  numbness and headaches.  Hematological: Does not bruise/bleed easily.  Psychiatric/Behavioral: Negative for hallucinations, confusion, dysphoric mood Tolerating Diet: npo DRUG ALLERGIES:  No Known Allergies  VITALS:  Blood pressure 134/86, pulse 78, temperature 97.7 F (36.5 C), temperature source Oral, resp. rate 16, height 5\' 10"  (1.778 m), weight 53.5 kg, SpO2 93 %.  PHYSICAL EXAMINATION:  Constitutional: Appears frail thin cachectic no distress. HENT: Normocephalic. Eyes: Conjunctivae and EOM are normal. PERRLA, no scleral icterus.  Neck: Normal ROM. Neck supple. No JVD. No tracheal deviation. CVS: RRR, S1/S2 +, no murmurs, no gallops, no carotid bruit.  Pulmonary: Effort and breath sounds normal, no stridor, rhonchi, wheezes, rales.  Abdominal: Soft. BS +,  no distension, tenderness, rebound or guarding.  Musculoskeletal: Normal range of motion. No edema and no tenderness.  Neuro: Alert. CN 2-12 grossly intact. No focal deficits. Skin: Skin is warm and dry. No rash noted. Psychiatric: Normal mood and affect.   LABORATORY PANEL:   CBC Recent Labs  Lab 05/13/19 0415  WBC 12.5*  HGB 11.5*  HCT 37.7*  PLT 145*   ------------------------------------------------------------------------------------------------------------------  Chemistries  Recent Labs  Lab 04/24/2019 1416  04/28/2019 0357  NA 142   < > 145  K 4.1   < > 3.2*  CL 105   < > 112*  CO2 29   < > 27  GLUCOSE 104*   < > 125*  BUN 35*   < > 28*  CREATININE 0.74   < > 0.53*  CALCIUM 9.3   < > 8.2*  MG  --    < > 2.0  AST 22  --   --   ALT 17  --   --   ALKPHOS 62  --   --   BILITOT 0.8  --   --    < > = values in this interval not displayed.   ------------------------------------------------------------------------------------------------------------------  Cardiac Enzymes No results for input(s): TROPONINI in the last 168 hours. ------------------------------------------------------------------------------------------------------------------  RADIOLOGY:  No results found. ASSESSMENT AND PLAN:   85 year old male with history of gastric cancer and two thirds of stomach removed who presented from GI office as a direct admit due to dysphasia and weight loss.  1.  Severe malnutrition with severe cachexia, weight loss and dysphasia: S/p  dobhoff tube placement but pulled off by patient. History of gastric cancer.  S/p surgery. Per radiologist, it is very difficult to get a J-tube placement.  He placed weighted enteric feeding tube for tube feeding.  The patient tolerated tube feeding.  Watch for refeeding syndrome. Dr. Alice Reichert requested a surgical consult.  Per Dr. Lysle Pearl, surgical placement of feeding tube today.  Right upper lobe pulmonary lesion. Work-up after better nutrition status  per Dr. Tasia Catchings..  Hypokalemia.  Continue potassium supplement. Hypophosphatemia.  Continue supplement.  PT consult; HHPT.  Patient is currently followed by outpatient Palliative at home. I discussed with the patient's daughter  Brian Ferguson. Management plans discussed with the patient and he is in agreement.  CODE STATUS: FULL  TOTAL TIME TAKING CARE OF THIS PATIENT: 28 minutes. POSSIBLE D/C 2 days, DEPENDING ON CLINICAL CONDITION.  Demetrios Loll M.D on 05/07/2019 at 11:56 AM  Between 7am to 6pm - Pager - [phone] After 6pm go to [email or web address] - password [email or web address] [facility]  Office  [phone]  CC: Primary care physician; Olin Hauser, DO  Note: This dictation was prepared with Dragon dictation along with smaller phrase technology. Any transcriptional errors that result from this process are unintentional.

## 2019-05-14 NOTE — Progress Notes (Signed)
PHARMACY CONSULT NOTE - FOLLOW UP  Pharmacy Consult for Electrolyte Monitoring and Replacement   Recent Labs: Potassium (mmol/L)  Date Value  05/02/2019 3.2 (L)  10/18/2011 4.0   Magnesium (mg/dL)  Date Value  04/27/2019 2.0   Calcium (mg/dL)  Date Value  05/06/2019 8.2 (L)   Calcium, Total (mg/dL)  Date Value  10/18/2011 9.1   Albumin (g/dL)  Date Value  04/29/2019 3.6   Phosphorus (mg/dL)  Date Value  04/24/2019 1.3 (L)   Sodium (mmol/L)  Date Value  05/12/2019 145  10/18/2011 138     Assessment: 85 year old male to restart tube feeds today. Pharmacy contacted by dietitian as patient is at risk of refeeding syndrome. MD consulted pharmacy to monitor electrolytes.  Difficult to give supplement via feeding tube as it is likely to clog per RN. Patient is supposed to get PEG tube. Currently patient only has one IV access.  Goal of Therapy:  Electrolytes WNL  Plan:  K 3.2. Patient receiving potassium 10 mEq IV x 4. Potassium phosphate will provide an additional 30 mEq potassium.  Phos 1.3. Will replace with potassium phosphate 20 mmol IV x 1. This will be started following final run of potassium.  Electrolytes have been ordered with morning labs.  Pharmacy to follow and replace electrolytes as indicated.  Tawnya Crook ,PharmD Clinical Pharmacist 04/19/2019 10:23 AM

## 2019-05-14 NOTE — Consult Note (Signed)
[facility] Consult   Reason for Consult:  Suicidal comments Referring Physician:  Hospitalist Patient Identification: Brian Ferguson MRN:  [id number] Principal Diagnosis: <principal problem not specified> Diagnosis:  Active Problems:   History of gastric cancer   Malnutrition ([place])   Cachexia ([place])   Dysphagia   Lung mass   Palliative care encounter   Protein-calorie malnutrition, severe   Total Time spent with patient: 40 minutes  Subjective:   Brian Ferguson is a 85 y.o. male patient reports today that he is not doing well States because yesterday surgery again.  Patient is questioned about him making the frequent suicidal comments and he states that "they are just billshit."  He denies any current suicidal or homicidal ideations and denies any hallucinations.  Patient denies having any depression.  Patient is offered medication to assist with appetite, sleep, and depression and patient states that he does not want any medications.  Contacted patient's daughter Julious Langlois with [phone].  She states that he has made comments in the past when he was at home from anything regarding to shooting himself, overdose, to sitting in the garage with the engine running on the car.  She states that he has never attempted to do anything to harm himself and she does not think that he would do it.  She states that he is a very sarcastic person and he has made frequent comments but she still feels that he would not do anything.  However she states that she thinks that an antidepressant could benefit him especially if it would increase his appetite as well as his sleep.  She is informed that her father stated that he did not want to take any medications.  She states that he says that frequently and that he ceased having to take medications as a sign of weakness.  She suggested to go ahead and start the medication and give him the opportunity to take it or to refuse it when it is  offered.  HPI:  Per Dr. Vianne Bulls: 85 y.o.malewith a known history of gastric cancer in 1987 sent in by Dr. Bonna Gains for IV fluids, NG tube placement. Patient had gastric cancer in 1987 and has been having progressive weight loss for last 6 to 7 months, patient told me that he lost about 40 pounds in the last 3 months associated with dysphagia. Patient was seen by speech therapy recommended n.p.o., referred to gastroenterology. Patient denies any nausea or abdominal pain. And he says that he likes to eat pizza and has no abdominal pain or dysphagia or difficulty swallowing. Patient was evaluated by speech therapy and had trouble even with applesauce and therefore the recommended n.p.o. status. Patient had a history of gastric cancer in 1987 status post to surgery two thirds of stomach removed along with submental stents by Dr. Pat Patrick 1989, patient did not have any follow-up with oncology, surgery or GI since then. Patient went to GI clinic today for feeding tube placement but because of his weight loss, dysphagia with unknown cancer status patient is referred for admission.  Patient is seen by this provider face-to-face.Patient is pleasant and cooperative. He remembers me from yesterday. He repeatedly denies any suicidal or homicidal ideations. I will take the consideration of the family's concern. I would recommend to start the Remeron Sol-tab 15 mg sublingual to assist with depression, sleep, and appetite. I have contacted Dr. Bridgett Larsson and notified him of the recommendations. Will continue to follo wth patient daily for progression.  Past Psychiatric History:  Denies  Risk to Self:   Risk to Others:   Prior Inpatient Therapy:   Prior Outpatient Therapy:    Past Medical History:  Past Medical History:  Diagnosis Date  . Cancer ([place]) 1987   stomach. 2/3 of stomach removed  . Confusion    alert and oriented at times  . Coronary artery disease   . Glaucoma   . History of blood clots   .  Malabsorption syndrome     Past Surgical History:  Procedure Laterality Date  . HERNIA REPAIR    . PEG PLACEMENT N/A 05/12/2019   Procedure: PERCUTANEOUS ENDOSCOPIC GASTROSTOMY (PEG) PLACEMENT;  Surgeon: Toledo, Benay Pike, MD;  Location: [facility] ENDOSCOPY;  Service: Gastroenterology;  Laterality: N/A;  . [place]   removed 2/3 of stomach d/t cancer   Family History: History reviewed. No pertinent family history. Family Psychiatric  History: Sone schizophrenia Social History:  Social History   Substance and Sexual Activity  Alcohol Use Not Currently     Social History   Substance and Sexual Activity  Drug Use No    Social History   Socioeconomic History  . Marital status: Widowed    Spouse name: Not on file  . Number of children: Not on file  . Years of education: Not on file  . Highest education level: Not on file  Occupational History  . Not on file  Social Needs  . Financial resource strain: Not on file  . Food insecurity    Worry: Not on file    Inability: Not on file  . Transportation needs    Medical: Not on file    Non-medical: Not on file  Tobacco Use  . Smoking status: Former Smoker    Types: Cigarettes  . Smokeless tobacco: Former Network engineer and Sexual Activity  . Alcohol use: Not Currently  . Drug use: No  . Sexual activity: Not on file  Lifestyle  . Physical activity    Days per week: Not on file    Minutes per session: Not on file  . Stress: Not on file  Relationships  . Social Herbalist on phone: Not on file    Gets together: Not on file    Attends religious service: Not on file    Active member of club or organization: Not on file    Attends meetings of clubs or organizations: Not on file    Relationship status: Not on file  Other Topics Concern  . Not on file  Social History Narrative   Lives at home with son.   Additional Social History:    Allergies:  No Known Allergies  Labs:  Results for orders placed  or performed during the hospital encounter of 05/13/2019 (from the past 48 hour(s))  CBC     Status: Abnormal   Collection Time: 05/13/19  4:15 AM  Result Value Ref Range   WBC 12.5 (H) 4.0 - 10.5 K/uL   RBC 3.91 (L) 4.22 - 5.81 MIL/uL   Hemoglobin 11.5 (L) 13.0 - 17.0 g/dL   HCT 37.7 (L) 39.0 - 52.0 %   MCV 96.4 80.0 - 100.0 fL   MCH 29.4 26.0 - 34.0 pg   MCHC 30.5 30.0 - 36.0 g/dL   RDW 13.2 11.5 - 15.5 %   Platelets 145 (L) 150 - 400 K/uL   nRBC 0.0 0.0 - 0.2 %    Comment: Performed at [facility], [address], [place], [place]  [address]  Basic metabolic panel     Status: Abnormal   Collection Time: 05/13/19  4:15 AM  Result Value Ref Range   Sodium 143 135 - 145 mmol/L   Potassium 2.7 (LL) 3.5 - 5.1 mmol/L    Comment: CRITICAL RESULT CALLED TO, READ BACK BY AND VERIFIED WITH  JACKIE PAGE AT 0532 05/13/2019 SDR    Chloride 110 98 - 111 mmol/L   CO2 26 22 - 32 mmol/L   Glucose, Bld 198 (H) 70 - 99 mg/dL   BUN 30 (H) 8 - 23 mg/dL   Creatinine, Ser 0.61 0.61 - 1.24 mg/dL   Calcium 8.3 (L) 8.9 - 10.3 mg/dL   GFR calc non Af Amer >60 >60 mL/min   GFR calc Af Amer >60 >60 mL/min   Anion gap 7 5 - 15    Comment: Performed at [facility], [address], [place], [place] [address]  Magnesium     Status: None   Collection Time: 05/13/19  4:15 AM  Result Value Ref Range   Magnesium 2.1 1.7 - 2.4 mg/dL    Comment: Performed at [facility], [address] [place], [place] [address]  Phosphorus     Status: Abnormal   Collection Time: 05/13/19  4:15 AM  Result Value Ref Range   Phosphorus 1.7 (L) 2.5 - 4.6 mg/dL    Comment: Performed at [facility], [address] [place], [place] [address]  Glucose, capillary     Status: Abnormal   Collection Time: 05/13/19  8:02 AM  Result Value Ref Range   Glucose-Capillary 128 (H) 70 - 99 mg/dL  Potassium     Status: Abnormal   Collection Time: 05/13/19  7:22 PM  Result Value Ref Range    Potassium 3.4 (L) 3.5 - 5.1 mmol/L    Comment: Performed at [facility], [address] [place], [place] [address]  Basic metabolic panel     Status: Abnormal   Collection Time: 05/05/2019  3:57 AM  Result Value Ref Range   Sodium 145 135 - 145 mmol/L   Potassium 3.2 (L) 3.5 - 5.1 mmol/L   Chloride 112 (H) 98 - 111 mmol/L   CO2 27 22 - 32 mmol/L   Glucose, Bld 125 (H) 70 - 99 mg/dL   BUN 28 (H) 8 - 23 mg/dL   Creatinine, Ser 0.53 (L) 0.61 - 1.24 mg/dL   Calcium 8.2 (L) 8.9 - 10.3 mg/dL   GFR calc non Af Amer >60 >60 mL/min   GFR calc Af Amer >60 >60 mL/min   Anion gap 6 5 - 15    Comment: Performed at [facility], [address], [place], [place] [address]  Magnesium     Status: None   Collection Time: 04/16/2019  3:57 AM  Result Value Ref Range   Magnesium 2.0 1.7 - 2.4 mg/dL    Comment: Performed at [facility], [address] [place], [place] [address]  Phosphorus     Status: Abnormal   Collection Time: 04/20/2019  3:57 AM  Result Value Ref Range   Phosphorus 1.3 (L) 2.5 - 4.6 mg/dL    Comment: Performed at [facility], [address] [place],  [address]  Glucose, capillary     Status: Abnormal   Collection Time: 04/30/2019  7:35 AM  Result Value Ref Range   Glucose-Capillary 129 (H) 70 - 99 mg/dL    Current Facility-Administered Medications  Medication Dose Route Frequency Provider  Last Rate Last Dose  . 0.9 %  sodium chloride infusion   Intravenous Continuous Epifanio Lesches, MD 75 mL/hr at 05/04/2019 0408    . [MAR Hold] acetaminophen (TYLENOL) tablet 650 mg  650 mg Oral Q6H PRN Epifanio Lesches, MD       Or  . Doug Sou Hold] acetaminophen (TYLENOL) suppository 650 mg  650 mg Rectal Q6H PRN Epifanio Lesches, MD      . Doug Sou Hold] bisacodyl (DULCOLAX) EC tablet 5 mg  5 mg Oral Daily PRN Epifanio Lesches, MD      . Doug Sou Hold] Chlorhexidine Gluconate Cloth 2 % PADS 6 each  6 each Topical Q0600 Demetrios Loll, MD   6 each  at 04/29/2019 1344  . [MAR Hold] docusate sodium (COLACE) capsule 100 mg  100 mg Oral BID Epifanio Lesches, MD      . Doug Sou Hold] free water 30 mL  30 mL Per Tube Q4H Bettey Costa, MD   Stopped at 04/21/2019 1426  . [MAR Hold] iohexol (OMNIPAQUE) 240 MG/ML injection 50 mL  50 mL Oral Once PRN Epifanio Lesches, MD      . Doug Sou Hold] iohexol (OMNIPAQUE) 300 MG/ML solution 10 mL  10 mL Other Once PRN Demetrios Loll, MD      . Doug Sou Hold] latanoprost (XALATAN) 0.005 % ophthalmic solution 1 drop  1 drop Both Eyes QHS Demetrios Loll, MD   1 drop at 05/13/19 2141  . [facility] Hold] MEDLINE mouth rinse  15 mL Mouth Rinse BID Demetrios Loll, MD   15 mL at 05/13/19 2148  . [MAR Hold] ondansetron (ZOFRAN) tablet 4 mg  4 mg Oral Q6H PRN Epifanio Lesches, MD       Or  . Doug Sou Hold] ondansetron (ZOFRAN) injection 4 mg  4 mg Intravenous Q6H PRN Epifanio Lesches, MD   4 mg at 05/11/19 1734  . potassium PHOSPHATE 20 mmol in dextrose 5 % 500 mL infusion  20 mmol Intravenous Once Tawnya Crook, RPH 84 mL/hr at 04/19/2019 1038 20 mmol at 04/22/2019 1038  . [MAR Hold] traZODone (DESYREL) tablet 25 mg  25 mg Oral QHS PRN Epifanio Lesches, MD        Musculoskeletal: Strength & Muscle Tone: decreased Gait & Station: Patient remained in bed during evaluation Patient leans: N/A  Psychiatric Specialty Exam: Physical Exam  Nursing note and vitals reviewed. Constitutional: He is oriented to person, place, and time. He appears well-developed and well-nourished.  Cardiovascular: Normal rate.  Respiratory: Effort normal.  Musculoskeletal: Normal range of motion.  Neurological: He is alert and oriented to person, place, and time.    Review of Systems  Constitutional: Negative.   HENT: Negative.   Eyes: Negative.   Respiratory: Negative.   Cardiovascular: Negative.   Gastrointestinal: Negative.   Genitourinary: Negative.   Musculoskeletal: Negative.   Skin: Negative.   Neurological: Negative.   Endo/Heme/Allergies:  Negative.     Blood pressure 132/70, pulse 81, temperature 99.3 F (37.4 C), temperature source Tympanic, resp. rate 15, height 5\' 10"  (1.778 m), weight 53.5 kg, SpO2 96 %.Body mass index is 16.92 kg/m.  General Appearance: Disheveled  Eye Contact:  Good  Speech:  Clear and Coherent and Normal Rate  Volume:  Decreased  Mood:  Euthymic  Affect:  Flat  Thought Process:  Coherent and Descriptions of Associations: Intact  Orientation:  Full (Time, Place, and Person)  Thought Content:  WDL  Suicidal Thoughts:  No but makes frequent comments  Homicidal Thoughts:  No  Memory:  Immediate;   Good Recent;   Good Remote;   Good  Judgement:  Fair  Insight:  Fair  Psychomotor Activity:  Normal  Concentration:  Concentration: Fair  Recall:  Good  Fund of Knowledge:  Fair  Language:  Good  Akathisia:  No  Handed:  Right  AIMS (if indicated):     Assets:  Communication Skills Desire for Improvement Financial Resources/Insurance Housing Resilience Social Support  ADL's:  Impaired  Cognition:  WNL  Sleep:        Treatment Plan Summary: Medication management and start Remeron Sol-Tab 15 mg sublingual QHS  Disposition: No evidence of imminent risk to self or others at present.   Patient does not meet criteria for psychiatric inpatient admission.  [place], FNP 05/15/2019 2:53 PM

## 2019-05-14 NOTE — Anesthesia Procedure Notes (Signed)
Procedure Name: Intubation Date/Time: 05/13/2019 3:12 PM Performed by: Hedda Slade, CRNA Pre-anesthesia Checklist: Patient identified, Patient being monitored, Timeout performed, Emergency Drugs available and Suction available Patient Re-evaluated:Patient Re-evaluated prior to induction Oxygen Delivery Method: Circle system utilized Preoxygenation: Pre-oxygenation with 100% oxygen Induction Type: IV induction Ventilation: Mask ventilation without difficulty Laryngoscope Size: Mac and 4 Grade View: Grade II Tube type: Oral Tube size: 7.0 mm Number of attempts: 1 Airway Equipment and Method: Stylet Placement Confirmation: ETT inserted through vocal cords under direct vision,  positive ETCO2 and breath sounds checked- equal and bilateral Secured at: 24 cm Tube secured with: Tape Dental Injury: Teeth and Oropharynx as per pre-operative assessment

## 2019-05-14 NOTE — Anesthesia Preprocedure Evaluation (Addendum)
Anesthesia Evaluation  Patient identified by MRN, date of birth, ID band Patient awake    Reviewed: Allergy & Precautions, H&P , NPO status , Patient's Chart, lab work & pertinent test results  Airway Mallampati: II  TM Distance: >3 FB     Dental  (+) Chipped   Pulmonary neg shortness of breath, neg COPD, neg recent URI, former smoker,  Lung mass          Cardiovascular (-) Past MI and (-) Cardiac Stents negative cardio ROS  (-) dysrhythmias      Neuro/Psych CVA, No Residual Symptoms negative psych ROS   GI/Hepatic Neg liver ROS, H/o gastric cancer s/p partial gastrectomy Malnutrition S/p failed attempted endoscopic PEG placement    Endo/Other  negative endocrine ROS  Renal/GU      Musculoskeletal   Abdominal   Peds  Hematology  (+) Blood dyscrasia, anemia ,   Anesthesia Other Findings Past Medical History: 1987: Cancer ([place])     Comment:  stomach. 2/3 of stomach removed No date: Glaucoma No date: History of blood clots No date: Malabsorption syndrome  Past Surgical History: No date: HERNIA REPAIR 04/22/2019: PEG PLACEMENT; N/A     Comment:  Procedure: PERCUTANEOUS ENDOSCOPIC GASTROSTOMY (PEG)               PLACEMENT;  Surgeon: Toledo, Benay Pike, MD;  Location:               [facility] ENDOSCOPY;  Service: Gastroenterology;  Laterality:               N/A; No date: STOMACH SURGERY  BMI    Body Mass Index: 16.93 kg/m      Reproductive/Obstetrics negative OB ROS                            Anesthesia Physical Anesthesia Plan  ASA: III  Anesthesia Plan: General ETT   Post-op Pain Management:    Induction:   PONV Risk Score and Plan: Ondansetron, Dexamethasone and Treatment may vary due to age or medical condition  Airway Management Planned:   Additional Equipment:   Intra-op Plan:   Post-operative Plan:   Informed Consent: I have reviewed the patients History and  Physical, chart, labs and discussed the procedure including the risks, benefits and alternatives for the proposed anesthesia with the patient or authorized representative who has indicated his/her understanding and acceptance.     Dental Advisory Given  Plan Discussed with: Anesthesiologist and CRNA  Anesthesia Plan Comments: (Consent obtained from daughter Daimon Kean via phone on 05/07/2019 at 1435.  KLF)       Anesthesia Quick Evaluation

## 2019-05-14 NOTE — Care Management Important Message (Signed)
Important Message  Patient Details  Name: Brian Ferguson MRN: [id number] Date of Birth: 08-15-1929   Medicare Important Message Given:  Yes     Juliann Pulse A Germani Gavilanes 04/21/2019, 12:41 PM

## 2019-05-14 NOTE — Progress Notes (Signed)
To OR. Madlyn Frankel, RN

## 2019-05-14 NOTE — Transfer of Care (Signed)
Immediate Anesthesia Transfer of Care Note  Patient: Brian Ferguson  Procedure(s) Performed: Procedure(s): INSERTION OF GASTROSTOMY TUBE (N/A)  Patient Location: PACU  Anesthesia Type:General  Level of Consciousness: sedated  Airway & Oxygen Therapy: Patient Spontanous Breathing and Patient connected to face mask oxygen  Post-op Assessment: Report given to RN and Post -op Vital signs reviewed and stable  Post vital signs: Reviewed and stable  Last Vitals:  Vitals:   05/07/2019 1434 05/08/2019 1921  BP: 132/70 135/79  Pulse: 81   Resp: 15 (!) 23  Temp: 37.4 C   SpO2: 20% 03%    Complications: No apparent anesthesia complications

## 2019-05-14 NOTE — Anesthesia Post-op Follow-up Note (Signed)
Anesthesia QCDR form completed.        

## 2019-05-15 ENCOUNTER — Inpatient Hospital Stay: Payer: Medicare Other

## 2019-05-15 ENCOUNTER — Encounter: Payer: Self-pay | Admitting: Surgery

## 2019-05-15 ENCOUNTER — Other Ambulatory Visit: Payer: Self-pay | Admitting: Hospice and Palliative Medicine

## 2019-05-15 DIAGNOSIS — E43 Unspecified severe protein-calorie malnutrition: Secondary | ICD-10-CM

## 2019-05-15 LAB — PHOSPHORUS: Phosphorus: 3.1 mg/dL (ref 2.5–4.6)

## 2019-05-15 LAB — CBC
HCT: 40.5 % (ref 39.0–52.0)
Hemoglobin: 12.4 g/dL — ABNORMAL LOW (ref 13.0–17.0)
MCH: 29.8 pg (ref 26.0–34.0)
MCHC: 30.6 g/dL (ref 30.0–36.0)
MCV: 97.4 fL (ref 80.0–100.0)
Platelets: 158 10*3/uL (ref 150–400)
RBC: 4.16 MIL/uL — ABNORMAL LOW (ref 4.22–5.81)
RDW: 13.4 % (ref 11.5–15.5)
WBC: 13 10*3/uL — ABNORMAL HIGH (ref 4.0–10.5)
nRBC: 0 % (ref 0.0–0.2)

## 2019-05-15 LAB — BASIC METABOLIC PANEL
Anion gap: 8 (ref 5–15)
BUN: 27 mg/dL — ABNORMAL HIGH (ref 8–23)
CO2: 26 mmol/L (ref 22–32)
Calcium: 7.9 mg/dL — ABNORMAL LOW (ref 8.9–10.3)
Chloride: 112 mmol/L — ABNORMAL HIGH (ref 98–111)
Creatinine, Ser: 0.92 mg/dL (ref 0.61–1.24)
GFR calc Af Amer: >60 mL/min (ref >60)
GFR calc non Af Amer: >60 mL/min (ref >60)
Glucose, Bld: 138 mg/dL — ABNORMAL HIGH (ref 70–99)
Potassium: 3.8 mmol/L (ref 3.5–5.1)
Sodium: 146 mmol/L — ABNORMAL HIGH (ref 135–145)

## 2019-05-15 LAB — MAGNESIUM: Magnesium: 1.9 mg/dL (ref 1.7–2.4)

## 2019-05-15 LAB — GLUCOSE, CAPILLARY: Glucose-Capillary: 115 mg/dL — ABNORMAL HIGH (ref 70–99)

## 2019-05-15 MED ORDER — PROPOFOL 500 MG/50ML IV EMUL
INTRAVENOUS | Status: AC
  Filled 2019-05-15: qty 50

## 2019-05-15 MED ORDER — DEXTROSE-NACL 5-0.45 % IV SOLN
INTRAVENOUS | Status: AC
  Administered 2019-05-15: 08:00:00 via INTRAVENOUS

## 2019-05-15 NOTE — Progress Notes (Signed)
Hematology/Oncology [facility] [phone] [phone]  Patient Care Team: Olin Hauser, DO as PCP - General (Family Medicine)   Name of the patient: Brian Ferguson  [id number]  1929/01/09  Date of visit: 05/15/19   INTERVAL HISTORY-  Patient is lying in bed.  Asking if he can swallow water.  Status post J-tube placement.   Review of systems- Review of Systems  Unable to perform ROS: Other (Not cooperative)    No Known Allergies  Patient Active Problem List   Diagnosis Date Noted   Protein-calorie malnutrition, severe 05/13/2019   Palliative care encounter    Dysphagia    Lung mass    Cachexia ([place]) 05/08/2019   History of gastric cancer 03/18/2019   Malnutrition ([place]) 03/18/2019   Recurrent falls 03/18/2019   Generalized weakness 03/18/2019     Past Medical History:  Diagnosis Date   Cancer ([place]) 1987   stomach. 2/3 of stomach removed   Confusion    alert and oriented at times   Coronary artery disease    Glaucoma    History of blood clots    Malabsorption syndrome      Past Surgical History:  Procedure Laterality Date   GASTROSTOMY N/A 05/06/2019   Procedure: INSERTION OF GASTROSTOMY TUBE;  Surgeon: Benjamine Sprague, DO;  Location: [facility] ORS;  Service: General;  Laterality: N/A;   HERNIA REPAIR     PEG PLACEMENT N/A 05/06/2019   Procedure: PERCUTANEOUS ENDOSCOPIC GASTROSTOMY (PEG) PLACEMENT;  Surgeon: Toledo, Benay Pike, MD;  Location: [facility] ENDOSCOPY;  Service: Gastroenterology;  Laterality: N/A;   STOMACH SURGERY  1987   removed 2/3 of stomach d/t cancer    Social History   Socioeconomic History   Marital status: Widowed    Spouse name: Not on file   Number of children: Not on file   Years of education: Not on file   Highest education level: Not on file  Occupational History   Not on file  Social Needs   Financial resource strain: Not on file   Food  insecurity    Worry: Not on file    Inability: Not on file   Transportation needs    Medical: Not on file    Non-medical: Not on file  Tobacco Use   Smoking status: Former Smoker    Types: Cigarettes   Smokeless tobacco: Former Systems developer  Substance and Sexual Activity   Alcohol use: Not Currently   Drug use: No   Sexual activity: Not on file  Lifestyle   Physical activity    Days per week: Not on file    Minutes per session: Not on file   Stress: Not on file  Relationships   Social connections    Talks on phone: Not on file    Gets together: Not on file    Attends religious service: Not on file    Active member of club or organization: Not on file    Attends meetings of clubs or organizations: Not on file    Relationship status: Not on file   Intimate partner violence    Fear of current or ex partner: Not on file    Emotionally abused: Not on file    Physically abused: Not on file    Forced sexual activity: Not on file  Other Topics Concern   Not on file  Social History Narrative   Lives at home with son.     History reviewed. No pertinent family history.  Current Facility-Administered Medications:    acetaminophen (TYLENOL) tablet 650 mg, 650 mg, Oral, Q6H PRN **OR** acetaminophen (TYLENOL) suppository 650 mg, 650 mg, Rectal, Q6H PRN, Sakai, Isami, DO   bisacodyl (DULCOLAX) EC tablet 5 mg, 5 mg, Oral, Daily PRN, Sakai, Isami, DO   Chlorhexidine Gluconate Cloth 2 % PADS 6 each, 6 each, Topical, Q0600, Sakai, Isami, DO, 6 each at 05/15/19 0647   dextrose 5 %-0.45 % sodium chloride infusion, , Intravenous, Continuous, Demetrios Loll, MD, Last Rate: 50 mL/hr at 05/15/19 1336   docusate sodium (COLACE) capsule 100 mg, 100 mg, Oral, BID, Sakai, Isami, DO   enoxaparin (LOVENOX) injection 40 mg, 40 mg, Subcutaneous, Q24H, Sakai, Isami, DO, 40 mg at 05/15/19 0750   HYDROcodone-acetaminophen (NORCO/VICODIN) 5-325 MG per tablet 1-2 tablet, 1-2 tablet, Oral, Q4H PRN,  Lysle Pearl, Isami, DO   latanoprost (XALATAN) 0.005 % ophthalmic solution 1 drop, 1 drop, Both Eyes, QHS, Sakai, Isami, DO, 1 drop at 05/13/19 2141   MEDLINE mouth rinse, 15 mL, Mouth Rinse, BID, Sakai, Isami, DO, 15 mL at 05/15/19 0751   morphine 2 MG/ML injection 1 mg, 1 mg, Intravenous, Q3H PRN, Lysle Pearl, Isami, DO   ondansetron (ZOFRAN) tablet 4 mg, 4 mg, Oral, Q6H PRN **OR** ondansetron (ZOFRAN) injection 4 mg, 4 mg, Intravenous, Q6H PRN, Sakai, Isami, DO, 4 mg at 05/11/19 1734   traZODone (DESYREL) tablet 25 mg, 25 mg, Oral, QHS PRN, Benjamine Sprague, DO   Physical exam:  Vitals:   05/01/2019 2046 04/28/2019 2331 05/15/19 0637 05/15/19 1500  BP: 112/71 (!) 132/91 (!) 145/92 136/68  Pulse: 87 (!) 102 94 94  Resp: _0 Temp: 98.8 F (37.1 C) 98.3 F (36.8 C) 98.1 F (36.7 C) 98.6 F (37 C)  TempSrc: Oral Oral Oral Axillary  SpO2: 94% 94% 96% 96%  Weight:      Height:       Physical Exam  Constitutional: No distress.  HENT:  Head: Normocephalic and atraumatic.  Eyes: Pupils are equal, round, and reactive to light.  Neck: Neck supple.  Cardiovascular: Normal rate and regular rhythm.  Pulmonary/Chest: Effort normal. He has no wheezes.  Abdominal: Soft.  J-tube  Neurological: He is alert.  Skin: Skin is warm and dry.       CMP Latest Ref Rng & Units 05/15/2019  Glucose 70 - 99 mg/dL 138(H)  BUN 8 - 23 mg/dL 27(H)  Creatinine 0.61 - 1.24 mg/dL 0.92  Sodium 135 - 145 mmol/L 146(H)  Potassium 3.5 - 5.1 mmol/L 3.8  Chloride 98 - 111 mmol/L 112(H)  CO2 22 - 32 mmol/L 26  Calcium 8.9 - 10.3 mg/dL 7.9(L)  Total Protein 6.5 - 8.1 g/dL -  Total Bilirubin 0.3 - 1.2 mg/dL -  Alkaline Phos 38 - 126 U/L -  AST 15 - 41 U/L -  ALT 0 - 44 U/L -   CBC Latest Ref Rng & Units 05/15/2019  WBC 4.0 - 10.5 K/uL 13.0(H)  Hemoglobin 13.0 - 17.0 g/dL 12.4(L)  Hematocrit 39.0 - 52.0 % 40.5  Platelets 150 - 400 K/uL 158    RADIOGRAPHIC STUDIES: I have personally reviewed the  radiological images as listed and agreed with the findings in the report.  Ct Chest W Contrast  Result Date: 05/08/2019 CLINICAL DATA:  Remote history of gastric cancer. Patient is having progressive weight loss. Symptoms of dysphagia. EXAM: CT CHEST, ABDOMEN, AND PELVIS WITH CONTRAST TECHNIQUE: Multidetector CT imaging of the chest, abdomen and pelvis was performed following  the standard protocol during bolus administration of intravenous contrast. CONTRAST:  62m OMNIPAQUE IOHEXOL 300 MG/ML  SOLN COMPARISON:  None. FINDINGS: CT CHEST FINDINGS Cardiovascular: The heart is normal in size. There is age-related tortuosity, ectasia and calcification of the thoracic aorta but no dissection or focal aneurysm. The branch vessels are patent. Moderate three-vessel coronary artery calcifications are noted. Mediastinum/Nodes: No mediastinal or hilar mass or lymphadenopathy. The esophagus is grossly normal. Lungs/Pleura: There is a 2.5 x 1.4 x 1.3 cm lesion in the right upper lobe suspicious for neoplasm but possibly scar tissue. Without prior studies for comparison PET-CT may be helpful for further evaluation. Patchy areas of tree-in-bud opacity in the left upper lobe and left lower lobe suggesting chronic inflammation or possibly atypical infection such as MAC. Endobronchial debris in the right lower lobe along with peribronchial thickening may suggest aspiration. No right lower lobe pneumonia. Patchy atelectasis or scarring change noted in the superior segment of the left lower lobe. Musculoskeletal: Fairly marked cachexia. No chest wall mass or axillary adenopathy. The bony structures are intact.  No bone lesions are identified. CT ABDOMEN PELVIS FINDINGS Hepatobiliary: Numerous hepatic cysts. No worrisome hepatic lesions or intrahepatic biliary dilatation. Pancreas: No mass, inflammation or ductal dilatation. Moderate pancreatic atrophy. Spleen: Normal size.  No worrisome lesions. Adrenals/Urinary Tract: The adrenal  glands and kidneys are unremarkable. A right renal cyst is noted with some thin peripheral calcifications. No hydronephrosis. The bladder is grossly normal. Stomach/Bowel: Surgical changes noted involving the stomach but I do not see any findings suspicious for recurrent mass. The small bowel and colon are grossly normal. Vascular/Lymphatic: Marked tortuosity and mild ectasia of the abdominal aorta with moderate atherosclerotic calcifications but no focal aneurysm or dissection. The branch vessels are patent. There is marked tortuosity, ectasia and calcification of the iliac arteries. 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 No obvious mesenteric or retroperitoneal mass or adenopathy. Reproductive: The prostate gland and seminal vesicles are grossly normal. Other: No pelvic mass or free pelvic fluid collections. Musculoskeletal: Scoliosis and advanced degenerative lumbar spondylosis but no worrisome bone lesions. IMPRESSION: 1. Worrisome right upper lobe pulmonary lesion measuring 2.5 x 1.4 x 1.3 cm. PET-CT may be helpful for further evaluation. No mediastinal or hilar lymphadenopathy or evidence of metastatic disease elsewhere. 2. Patchy areas of tree-in-bud appearance suggesting chronic inflammation or atypical infection such as MAC. 3. Moderate amount of endobronchial debris in the right lower lobe along with peribronchial thickening possibly due to aspiration but no definite findings for pneumonia. 4. Numerous hepatic cysts but no worrisome hepatic lesions. There is also a complex upper pole right renal cyst with areas of peripheral calcification but no solid enhancing components. 5. Advanced vascular disease. 6. Surgical changes from gastric surgery without complicating features. I do not see any definite findings for recurrent gastric mass. Electronically Signed   By: PMarijo SanesM.D.   On: 05/08/2019 08:51   Ct Abdomen Pelvis W Contrast  Result Date:  05/08/2019 CLINICAL DATA:  Remote history of gastric cancer. Patient is having progressive weight loss. Symptoms of dysphagia. EXAM: CT CHEST, ABDOMEN, AND PELVIS WITH CONTRAST TECHNIQUE: Multidetector CT imaging of the chest, abdomen and pelvis was performed following the standard protocol during bolus administration of intravenous contrast. CONTRAST:  782mOMNIPAQUE IOHEXOL 300 MG/ML  SOLN COMPARISON:  None. FINDINGS: CT CHEST FINDINGS Cardiovascular: The heart is normal in size. There is age-related tortuosity, ectasia and calcification of the thoracic aorta but no dissection or  focal aneurysm. The branch vessels are patent. Moderate three-vessel coronary artery calcifications are noted. Mediastinum/Nodes: No mediastinal or hilar mass or lymphadenopathy. The esophagus is grossly normal. Lungs/Pleura: There is a 2.5 x 1.4 x 1.3 cm lesion in the right upper lobe suspicious for neoplasm but possibly scar tissue. Without prior studies for comparison PET-CT may be helpful for further evaluation. Patchy areas of tree-in-bud opacity in the left upper lobe and left lower lobe suggesting chronic inflammation or possibly atypical infection such as MAC. Endobronchial debris in the right lower lobe along with peribronchial thickening may suggest aspiration. No right lower lobe pneumonia. Patchy atelectasis or scarring change noted in the superior segment of the left lower lobe. Musculoskeletal: Fairly marked cachexia. No chest wall mass or axillary adenopathy. The bony structures are intact.  No bone lesions are identified. CT ABDOMEN PELVIS FINDINGS Hepatobiliary: Numerous hepatic cysts. No worrisome hepatic lesions or intrahepatic biliary dilatation. Pancreas: No mass, inflammation or ductal dilatation. Moderate pancreatic atrophy. Spleen: Normal size.  No worrisome lesions. Adrenals/Urinary Tract: The adrenal glands and kidneys are unremarkable. A right renal cyst is noted with some thin peripheral calcifications. No  hydronephrosis. The bladder is grossly normal. Stomach/Bowel: Surgical changes noted involving the stomach but I do not see any findings suspicious for recurrent mass. The small bowel and colon are grossly normal. Vascular/Lymphatic: Marked tortuosity and mild ectasia of the abdominal aorta with moderate atherosclerotic calcifications but no focal aneurysm or dissection. The branch vessels are patent. There is marked tortuosity, ectasia and calcification of the iliac arteries. 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 No obvious mesenteric or retroperitoneal mass or adenopathy. Reproductive: The prostate gland and seminal vesicles are grossly normal. Other: No pelvic mass or free pelvic fluid collections. Musculoskeletal: Scoliosis and advanced degenerative lumbar spondylosis but no worrisome bone lesions. IMPRESSION: 1. Worrisome right upper lobe pulmonary lesion measuring 2.5 x 1.4 x 1.3 cm. PET-CT may be helpful for further evaluation. No mediastinal or hilar lymphadenopathy or evidence of metastatic disease elsewhere. 2. Patchy areas of tree-in-bud appearance suggesting chronic inflammation or atypical infection such as MAC. 3. Moderate amount of endobronchial debris in the right lower lobe along with peribronchial thickening possibly due to aspiration but no definite findings for pneumonia. 4. Numerous hepatic cysts but no worrisome hepatic lesions. There is also a complex upper pole right renal cyst with areas of peripheral calcification but no solid enhancing components. 5. Advanced vascular disease. 6. Surgical changes from gastric surgery without complicating features. I do not see any definite findings for recurrent gastric mass. Electronically Signed   By: Marijo Sanes M.D.   On: 05/08/2019 08:51   Dg Carlena Hurl Op [employer] Speech Path  Result Date: 05/01/2019 CLINICAL DATA:  Weight loss.  Dysphagia. EXAM: MODIFIED BARIUM SWALLOW TECHNIQUE: Different  consistencies of barium were administered orally to the patient by the Speech Pathologist. Imaging of the pharynx was performed in the lateral projection. The radiologist was present in the fluoroscopy room for this study, providing personal supervision. FLUOROSCOPY TIME:  Fluoroscopy Time:  1 minutes Number of Acquired Spot Images: 0 COMPARISON:  None. FINDINGS: There was aspiration during the study. Please see the speech pathologist's report for full details. IMPRESSION: There was aspiration during the study. Please see the speech pathologist's report for full details. Please refer to the Speech Pathologists report for complete details and recommendations. Electronically Signed   By: Dorise Bullion III M.D   On: 05/01/2019 14:08   Dg Addison Bailey  G Tube Plc W/fl W/rad  Result Date: 05/12/2019 CLINICAL DATA:  Malnutrition, tube placement EXAM: NASO G TUBE PLACEMENT WITH FL AND WITH RAD CONTRAST:  10 mL Isovue-300 FLUOROSCOPY TIME:  Fluoroscopy Time:  00:30 Number of Acquired Spot Images: 1 COMPARISON:  None. FINDINGS: Using lidocaine jelly for local anesthesia, a weighted enteric feeding tube was advanced through the right nostril to the partially resected stomach under fluoroscopic guidance without complication. A small volume of water soluble contrast was injected to confirm intragastric tube location and the tube was secured. IMPRESSION: Successful fluoroscopic placement of weighted enteric feeding tube within the partially resected stomach. Electronically Signed   By: Eddie Candle M.D.   On: 05/12/2019 09:18   Dg Addison Bailey G Tube Plc W/fl W/rad  Result Date: 04/27/2019 CLINICAL DATA:  Feeding tube needed. EXAM: NASO G TUBE PLACEMENT WITH FL AND WITH RAD CONTRAST:  None. FLUOROSCOPY TIME:  Fluoroscopy Time:  4 minutes 42 seconds Radiation Exposure Index (if provided by the fluoroscopic device): 43.0 mGy COMPARISON:  CT 05/06/2019. FINDINGS: A feeding tube was placed through the left nares down the esophagus into the  stomach under fluoroscopic guidance. There were no complications. IMPRESSION: Successful feeding tube placement. Electronically Signed   By: Marcello Moores  Register   On: 05/03/2019 14:56   Dg Foot Complete Right  Result Date: 05/02/2019 Please see detailed radiograph report in office note.   Assessment and plan-  Patient is a 85 y.o. male with a history of gastric cancer in 1987, status post partial gastrectomy, History of blood clots, glaucoma, malabsorption syndrome presented for evaluation of progressive weight loss.  Was found to have severe dysphagia.  Image work-up showed lung mass.  #Cachectic, malnutrition secondary to dysphagia. Status post J-tube placement. Watch for refeeding syndrome.  #Lung mass Patient is not willing to discuss further today. He appears to be quite upset that I told him that he is n.p.o. due to high aspiration risk. Recommend CT head as work-up of lung mass, also dysphagia.  #History of gastric cancer, remote history in 9.  Pathology is not available to me at this point. No definitive radiographic evidence of local or distant metastasis at this point. # depression.    Mirtazapine was started hopefully helping with symptoms. #Ongoing goal of care discussion.  Palliative care following.    Thank you for allowing me to participate in the care of this patient.   Earlie Server, MD, PhD Hematology Oncology [facility] at [facility] Pager- [phone] 05/15/2019

## 2019-05-15 NOTE — Progress Notes (Addendum)
Subjective:  CC: Brian Ferguson is a 85 y.o. male  Hospital stay day 8, malnutrition POD #1 open jejunostomy tube placement  HPI: No acute issues overnight.  Still asking for water  ROS:  General: Denies weight loss, weight gain, fatigue, fevers, chills, and night sweats. Heart: Denies chest pain, palpitations, racing heart, irregular heartbeat, leg pain or swelling, and decreased activity tolerance. Respiratory: Denies breathing difficulty, shortness of breath, wheezing, cough, and sputum. GI: Denies change in appetite, heartburn, nausea, vomiting, constipation, diarrhea, and blood in stool. GU: Denies difficulty urinating, pain with urinating, urgency, frequency, blood in urine.   Objective:   Temp:  [98.1 F (36.7 C)-99.3 F (37.4 C)] 98.1 F (36.7 C) (07/30 0637) Pulse Rate:  [71-102] 94 (07/30 0637) Resp:  [14-23] 16 (07/30 0637) BP: (99-145)/(63-98) 145/92 (07/30 0637) SpO2:  [94 %-98 %] 96 % (07/30 0637) Weight:  [53.5 kg] 53.5 kg (07/29 1434)     Height: 5\' 10"  (177.8 cm) Weight: 53.5 kg BMI (Calculated): 16.92   Intake/Output this shift:   Intake/Output Summary (Last 24 hours) at 05/15/2019 0850 Last data filed at 05/15/2019 0746 Gross per 24 hour  Intake 2477.18 ml  Output 1460 ml  Net 1017.18 ml    Constitutional :  alert, cooperative, appears stated age, cachectic and no distress  Respiratory:  clear to auscultation bilaterally  Cardiovascular:  regular rate and rhythm  Gastrointestinal: soft, but diffuse distention with tenderness throughout, consistent with ileus.  jtube site with some drainage.   Skin: Cool and moist.   Psychiatric: Normal affect, non-agitated, not confused       LABS:  CMP Latest Ref Rng & Units 05/15/2019 05/01/2019 05/08/2019  Glucose 70 - 99 mg/dL 138(H) - 125(H)  BUN 8 - 23 mg/dL 27(H) - 28(H)  Creatinine 0.61 - 1.24 mg/dL 0.92 0.79 0.53(L)  Sodium 135 - 145 mmol/L 146(H) - 145  Potassium 3.5 - 5.1 mmol/L 3.8 - 3.2(L)  Chloride 98 -  111 mmol/L 112(H) - 112(H)  CO2 22 - 32 mmol/L 26 - 27  Calcium 8.9 - 10.3 mg/dL 7.9(L) - 8.2(L)  Total Protein 6.5 - 8.1 g/dL - - -  Total Bilirubin 0.3 - 1.2 mg/dL - - -  Alkaline Phos 38 - 126 U/L - - -  AST 15 - 41 U/L - - -  ALT 0 - 44 U/L - - -   CBC Latest Ref Rng & Units 05/15/2019 04/16/2019 05/13/2019  WBC 4.0 - 10.5 K/uL 13.0(H) 10.6(H) 12.5(H)  Hemoglobin 13.0 - 17.0 g/dL 12.4(L) 13.0 11.5(L)  Hematocrit 39.0 - 52.0 % 40.5 42.7 37.7(L)  Platelets 150 - 400 K/uL 158 161 145(L)    RADS: n/a Assessment:   Cachexia, malnourishment secondary to dysphagia. Incidental lung lesion. Discussed case with oncology and they recommend maximizing nutritional status first in case lung lesion requires additional treatment. Recommended proceeding with feeding tube placement.  S/p open Jtube placement.  Now, with postop ileus.  Place jtube to gravity, continue to monitor.  Will consider restarting tube feeds when clinically better.  PEG tube should be at 4cm mark on the skin.  The balloon should only be filled up to 27ml of fluid in order to prevent an obstruction.

## 2019-05-15 NOTE — Progress Notes (Signed)
PT Cancellation Note  Patient Details Name: [name] MRN: [id number] DOB: [birth year]   Cancelled Treatment:    Reason Eval/Treat Not Completed: Patient declined, no reason specified. Treatment attempted; pt declines therapy stating he can't do it. Pt presenting very weak and reports abdomen sore. Pt engages in lengthy conversation wanting water and voicing aggravation over not being given any (pt is NPO). Offered several attempts to speak with nurse or another service for support, but pt repeatedly declines. Pt ultimately feels angry that he came to the hospital. Pt states therapist can do nothing to make him more comfortable. Re attempt at a later date.    Larae Grooms, PTA 05/15/2019, 2:03 PM

## 2019-05-15 NOTE — Op Note (Signed)
Preoperative diagnosis: Dysphagia  Postoperative diagnosis: same as above  Procedure: Open jejunostomy tube placement, lysis of adhesions, small bowel repair  Anesthesia: GETA   Surgeon: Benjamine Sprague Assistant: Cintron-Diaz  Specimen: None  Complications: Several serosal injuries of small bowel during lysis of adhesions requiring primary repair  EBL: 73mL  Wound Classification: Clean Contaminated  Indications: see HPI  Findings: Dense adhesions throughout the entire abdomen, unable to visualize the stomach.  Ligament of Treitz identified.   Description of procedure: The patient was placed on the operating table in the supine position. SCDs placed, pre-op abx administered.  General anesthesia was induced by anesthesia. A time-out was completed verifying correct patient, procedure, site, positioning, and implant(s) and/or special equipment prior to beginning this procedure.   The abdomen was prepped and draped in the usual sterile fashion.  An incision was made along the previous midline incision site.  Upon entering the abdominal cavity, there was noted to be extensive adhesions throughout, involving the small bowel attached to the large bowel along with the entire abdominal wall.  85% of the operative time was dedicated to proceeding with lysis of adhesions in order to fully evaluate the anatomy to locate the proper placement for a feeding tube.  Lysis of adhesions was conducted with a combination of blunt dissection, electrocautery, sharp dissection.  3 total serosal injuries were noted during this process along various parts of the small bowel, all of which were repaired the moment they were identified with interrupted 3-0 silk sutures.    The bowel was eventually freed enough from adhesions to identify the ileocecal valve, from which the entire small bowel was traced back to the ligament of Treitz.  Attempt was made to visualize the stomach, but adhesions of the omentum and transverse  colon along the abdominal wall prohibited to safely visualize the stomach despite multiple attempts.  Decision was made at this point to proceed with a jejunostomy tube placement.  54 French PEG tube was placed through the abdominal wall in the left upper quadrant under direct visualization.  A enterotomy was created andPEG tube placed through it, confirming it was pointing in the distal portion, and the balloon was inflated to 3 mL's.  After confirming the balloon was not too large to obstruct the entire bowel lumen, it was secured in place by multiple interrupted 3-0 silk sutures in a Lembert fashion along the bowel wall.  The jejunum itself was then pexied up to the abdominal wall along with the PEG tube.  Slight tension was placed on the PEG tube and then the stopper was secured to the skin using 3-0 Prolene.  Final inspection of the abdominal cavity noted no other obvious injuries and no evidence of active bleeding.  The midline incision was then closed with 0 PDS x2 in a running fashion.  Exparel infused around the midline incision as well as the newly formed PEG tube insertion site.  Staples used to close the midline, dressed with honeycomb dressing.  Tube site was dressed with a drain sponge.  Dobbhoff tube was removed prior to waking patient from anesthesia.  Afterwards, patient was safely transferred to PACU in stable condition.  At the end of the procedure sponge count and instrument counts were correct as well.

## 2019-05-15 NOTE — Progress Notes (Signed)
[facility] NAME: Brian Ferguson    MR#:  [id number]  DATE OF BIRTH:  02-23-1929  SUBJECTIVE:   The patient is lethargic, s/p surgical J-tube placement. REVIEW OF SYSTEMS:    Review of Systems  Constitutional: Negative for fever, chills ++ poor appetite with weight loss HENT: Negative for ear pain, nosebleeds, congestion, facial swelling, rhinorrhea, neck pain, neck stiffness and ear discharge.   Respiratory: Negative for cough, shortness of breath, wheezing  Cardiovascular: Negative for chest pain, palpitations and leg swelling.  Gastrointestinal: Negative for heartburn, abdominal pain, vomiting, diarrhea or consitpation Genitourinary: Negative for dysuria, urgency, frequency, hematuria Musculoskeletal: Negative for back pain or joint pain Neurological: Negative for dizziness, seizures, syncope, focal weakness,  numbness and headaches.  Hematological: Does not bruise/bleed easily.  Psychiatric/Behavioral: Negative for hallucinations, confusion, dysphoric mood Tolerating Diet: npo DRUG ALLERGIES:  No Known Allergies  VITALS:  Blood pressure (!) 145/92, pulse 94, temperature 98.1 F (36.7 C), temperature source Oral, resp. rate 16, height 5\' 10"  (1.778 m), weight 53.5 kg, SpO2 96 %.  PHYSICAL EXAMINATION:  Constitutional: Appears frail thin cachectic no distress.  Looks lethargic. HENT: Normocephalic. Eyes: Conjunctivae and EOM are normal. PERRLA, no scleral icterus.  Neck: Normal ROM. Neck supple. No JVD. No tracheal deviation. CVS: RRR, S1/S2 +, no murmurs, no gallops, no carotid bruit.  Pulmonary: Effort and breath sounds normal, no stridor, rhonchi, wheezes, rales.  Abdominal: Soft.  No bowel sounds,  no distension, tenderness, rebound or guarding.  Musculoskeletal: Normal range of motion. No edema and no tenderness.  Neuro: Alert. CN 2-12 grossly intact. No focal deficits. Skin: Skin is warm and dry. No rash  noted. Psychiatric: Lethargic. LABORATORY PANEL:   CBC Recent Labs  Lab 05/15/19 0531  WBC 13.0*  HGB 12.4*  HCT 40.5  PLT 158   ------------------------------------------------------------------------------------------------------------------  Chemistries  Recent Labs  Lab 05/15/19 0531  NA 146*  K 3.8  CL 112*  CO2 26  GLUCOSE 138*  BUN 27*  CREATININE 0.92  CALCIUM 7.9*  MG 1.9   ------------------------------------------------------------------------------------------------------------------  Cardiac Enzymes No results for input(s): TROPONINI in the last 168 hours. ------------------------------------------------------------------------------------------------------------------  RADIOLOGY:  No results found. ASSESSMENT AND PLAN:   85 year old male with history of gastric cancer and two thirds of stomach removed who presented from GI office as a direct admit due to dysphasia and weight loss.  1.  Severe malnutrition with severe cachexia, weight loss and dysphasia: S/p  dobhoff tube placement but pulled off by patient. History of gastric cancer.  S/p surgery. Per radiologist, it is very difficult to get a J-tube placement.  He placed weighted enteric feeding tube for tube feeding.  The patient tolerated tube feeding.  Watch for refeeding syndrome. Dr. Alice Reichert requested a surgical consult.  S/p Surgical J-tube placement.  Follow-up with Dr. Lysle Pearl to start tube feeding.  Right upper lobe pulmonary lesion. Work-up after better nutrition status per Dr. Tasia Catchings..  Hypokalemia.  Improved with potassium supplement. Hypophosphatemia.  Improved with supplement. Hypernatremia.  Change to D5 half-normal saline IV and follow-up BMP.  Leukocytosis, possible due to reactio, follow-up CBC. Dehydration.  IV fluid support and follow-up BMP.  I discussed with the patient's daughter [name].  Hold wants patient to be placed to skilled nursing facility with palliative care  follow-up. Management plans discussed with the patient and he is in agreement.  CODE STATUS: FULL  TOTAL TIME TAKING CARE OF THIS PATIENT: 30 minutes. POSSIBLE D/C 2 days,  DEPENDING ON CLINICAL CONDITION.  Demetrios Loll M.D on 05/15/2019 at 11:06 AM  Between 7am to 6pm - Pager - [phone] After 6pm go to [email or web address] - password [email or web address]  Office  [phone]  CC: Primary care physician; Olin Hauser, DO  Note: This dictation was prepared with Dragon dictation along with smaller phrase technology. Any transcriptional errors that result from this process are unintentional.

## 2019-05-15 NOTE — Anesthesia Postprocedure Evaluation (Signed)
Anesthesia Post Note  Patient: Frutoso Schatz  Procedure(s) Performed: INSERTION OF GASTROSTOMY TUBE (N/A Abdomen)  Patient location during evaluation: PACU Anesthesia Type: General Level of consciousness: awake and alert Pain management: pain level controlled Vital Signs Assessment: post-procedure vital signs reviewed and stable Respiratory status: spontaneous breathing, nonlabored ventilation, respiratory function stable and patient connected to nasal cannula oxygen Cardiovascular status: blood pressure returned to baseline and stable Postop Assessment: no apparent nausea or vomiting Anesthetic complications: no     Last Vitals:  Vitals:   04/20/2019 2331 05/15/19 0637  BP: (!) 132/91 (!) 145/92  Pulse: (!) 102 94  Resp: 18 16  Temp: 36.8 C 36.7 C  SpO2: 94% 96%    Last Pain:  Vitals:   05/15/19 0637  TempSrc: Oral  PainSc:                  Precious Haws Piscitello

## 2019-05-15 NOTE — Progress Notes (Signed)
[place]  Telephone:([phone] [phone]   Name: Brian Ferguson Date: 05/15/2019 MRN: [id number]  DOB: 03-27-1929  Patient Care Team: Olin Hauser, DO as PCP - General (Family Medicine)    REASON FOR CONSULTATION: Palliative Care consult requested for this 85 y.o. male with multiple medical problems including remote history of gastric cancer status post partial gastrectomy in 1987.  Patient has a recent history of significant weight loss over the past 4 to 6 months.  Patient underwent modified barium swallow study and was found to have severe dysphasia with aspiration and alternative nutrition was recommended.  He was subsequently referred to GI who recommended the patient be hospitalized for work-up.  Patient was admitted to the hospital on 05/06/2019 for same.  CT of the chest, abdomen, and pelvis on 05/06/2019 revealed a right upper lobe pulmonary mass concerning for malignancy.  Patient has been seen in consultation by medical oncology but is felt not to be a treatment candidate due to frailty and nutritional status.  Patient is status post EGD on 05/16/2019 with PEG attempted but insertion was not successful due to post gastrectomy anatomy.  Patient is pending surgical J-tube.  He was referred to palliative care to help address goals.   CODE STATUS: Full code  PAST MEDICAL HISTORY: Past Medical History:  Diagnosis Date   Cancer ([place]) 1987   stomach. 2/3 of stomach removed   Confusion    alert and oriented at times   Coronary artery disease    Glaucoma    History of blood clots    Malabsorption syndrome     PAST SURGICAL HISTORY:  Past Surgical History:  Procedure Laterality Date   HERNIA REPAIR     PEG PLACEMENT N/A 05/12/2019   Procedure: PERCUTANEOUS ENDOSCOPIC GASTROSTOMY (PEG) PLACEMENT;  Surgeon: Toledo, Benay Pike, MD;  Location: [facility] ENDOSCOPY;  Service: Gastroenterology;  Laterality: N/A;    [place]   removed 2/3 of stomach d/t cancer    HEMATOLOGY/ONCOLOGY HISTORY:  Oncology History   No history exists.    ALLERGIES:  has No Known Allergies.  MEDICATIONS:  Current Facility-Administered Medications  Medication Dose Route Frequency Provider Last Rate Last Dose   acetaminophen (TYLENOL) tablet 650 mg  650 mg Oral Q6H PRN Lysle Pearl, Isami, DO       Or   acetaminophen (TYLENOL) suppository 650 mg  650 mg Rectal Q6H PRN Sakai, Isami, DO       bisacodyl (DULCOLAX) EC tablet 5 mg  5 mg Oral Daily PRN Sakai, Isami, DO       Chlorhexidine Gluconate Cloth 2 % PADS 6 each  6 each Topical Q0600 Sakai, Isami, DO   6 each at 05/15/19 0647   dextrose 5 %-0.45 % sodium chloride infusion   Intravenous Continuous Demetrios Loll, MD 50 mL/hr at 05/15/19 0749     docusate sodium (COLACE) capsule 100 mg  100 mg Oral BID Sakai, Isami, DO       enoxaparin (LOVENOX) injection 40 mg  40 mg Subcutaneous Q24H Sakai, Isami, DO   40 mg at 05/15/19 0750   HYDROcodone-acetaminophen (NORCO/VICODIN) 5-325 MG per tablet 1-2 tablet  1-2 tablet Oral Q4H PRN Lysle Pearl, Isami, DO       latanoprost (XALATAN) 0.005 % ophthalmic solution 1 drop  1 drop Both Eyes QHS Sakai, Isami, DO   1 drop at 05/13/19 2141   MEDLINE mouth rinse  15 mL Mouth Rinse BID Lysle Pearl, Isami, DO  15 mL at 05/15/19 0751   morphine 2 MG/ML injection 1 mg  1 mg Intravenous Q3H PRN Lysle Pearl, Isami, DO       ondansetron (ZOFRAN) tablet 4 mg  4 mg Oral Q6H PRN Lysle Pearl, Isami, DO       Or   ondansetron (ZOFRAN) injection 4 mg  4 mg Intravenous Q6H PRN Lysle Pearl, Isami, DO   4 mg at 05/11/19 1734   traZODone (DESYREL) tablet 25 mg  25 mg Oral QHS PRN Sakai, Isami, DO        VITAL SIGNS: BP (!) 145/92 (BP Location: Left Arm)    Pulse 94    Temp 98.1 F (36.7 C) (Oral)    Resp 16    Ht _0  (1.778 m)    Wt 117 lb 15.1 oz (53.5 kg)    SpO2 96%    BMI 16.92 kg/m  Filed Weights   05/13/19 0622 04/23/2019 0408 04/20/2019 1434  Weight: 115  lb 8 oz (52.4 kg) 118 lb (53.5 kg) 117 lb 15.1 oz (53.5 kg)    Estimated body mass index is 16.92 kg/m as calculated from the following:   Height as of this encounter: _1  (1.778 m).   Weight as of this encounter: 117 lb 15.1 oz (53.5 kg).  LABS: CBC:    Component Value Date/Time   WBC 13.0 (H) 05/15/2019 0531   HGB 12.4 (L) 05/15/2019 0531   HGB 13.8 10/18/2011 1442   HCT 40.5 05/15/2019 0531   HCT 43.3 10/18/2011 1442   PLT 158 05/15/2019 0531   PLT 157 10/18/2011 1442   MCV 97.4 05/15/2019 0531   MCV 94 10/18/2011 1442   NEUTROABS 6,909 03/18/2019 1122   NEUTROABS 4.0 10/18/2011 1442   LYMPHSABS 1,513 03/18/2019 1122   LYMPHSABS 1.5 10/18/2011 1442   MONOABS 0.7 10/18/2011 1442   EOSABS 47 03/18/2019 1122   EOSABS 0.1 10/18/2011 1442   BASOSABS 38 03/18/2019 1122   BASOSABS 0.0 10/18/2011 1442   Comprehensive Metabolic Panel:    Component Value Date/Time   NA 146 (H) 05/15/2019 0531   NA 138 10/18/2011 1442   K 3.8 05/15/2019 0531   K 4.0 10/18/2011 1442   CL 112 (H) 05/15/2019 0531   CL 101 10/18/2011 1442   CO2 26 05/15/2019 0531   CO2 31 10/18/2011 1442   BUN 27 (H) 05/15/2019 0531   BUN 24 (H) 10/18/2011 1442   CREATININE 0.92 05/15/2019 0531   CREATININE 0.83 03/18/2019 1122   GLUCOSE 138 (H) 05/15/2019 0531   GLUCOSE 106 (H) 10/18/2011 1442   CALCIUM 7.9 (L) 05/15/2019 0531   CALCIUM 9.1 10/18/2011 1442   AST 22 04/19/2019 1416   ALT 17 05/04/2019 1416   ALKPHOS 62 05/08/2019 1416   BILITOT 0.8 04/20/2019 1416   PROT 6.9 05/05/2019 1416   ALBUMIN 3.6 04/29/2019 1416    RADIOGRAPHIC STUDIES: Ct Chest W Contrast  Result Date: 05/08/2019 CLINICAL DATA:  Remote history of gastric cancer. Patient is having progressive weight loss. Symptoms of dysphagia. EXAM: CT CHEST, ABDOMEN, AND PELVIS WITH CONTRAST TECHNIQUE: Multidetector CT imaging of the chest, abdomen and pelvis was performed following the standard protocol during bolus administration of  intravenous contrast. CONTRAST:  63m OMNIPAQUE IOHEXOL 300 MG/ML  SOLN COMPARISON:  None. FINDINGS: CT CHEST FINDINGS Cardiovascular: The heart is normal in size. There is age-related tortuosity, ectasia and calcification of the thoracic aorta but no dissection or focal aneurysm. The branch vessels are patent. Moderate three-vessel coronary artery  calcifications are noted. Mediastinum/Nodes: No mediastinal or hilar mass or lymphadenopathy. The esophagus is grossly normal. Lungs/Pleura: There is a 2.5 x 1.4 x 1.3 cm lesion in the right upper lobe suspicious for neoplasm but possibly scar tissue. Without prior studies for comparison PET-CT may be helpful for further evaluation. Patchy areas of tree-in-bud opacity in the left upper lobe and left lower lobe suggesting chronic inflammation or possibly atypical infection such as MAC. Endobronchial debris in the right lower lobe along with peribronchial thickening may suggest aspiration. No right lower lobe pneumonia. Patchy atelectasis or scarring change noted in the superior segment of the left lower lobe. Musculoskeletal: Fairly marked cachexia. No chest wall mass or axillary adenopathy. The bony structures are intact.  No bone lesions are identified. CT ABDOMEN PELVIS FINDINGS Hepatobiliary: Numerous hepatic cysts. No worrisome hepatic lesions or intrahepatic biliary dilatation. Pancreas: No mass, inflammation or ductal dilatation. Moderate pancreatic atrophy. Spleen: Normal size.  No worrisome lesions. Adrenals/Urinary Tract: The adrenal glands and kidneys are unremarkable. A right renal cyst is noted with some thin peripheral calcifications. No hydronephrosis. The bladder is grossly normal. Stomach/Bowel: Surgical changes noted involving the stomach but I do not see any findings suspicious for recurrent mass. The small bowel and colon are grossly normal. Vascular/Lymphatic: Marked tortuosity and mild ectasia of the abdominal aorta with moderate atherosclerotic  calcifications but no focal aneurysm or dissection. The branch vessels are patent. There is marked tortuosity, ectasia and calcification of the iliac arteries. 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 No obvious mesenteric or retroperitoneal mass or adenopathy. Reproductive: The prostate gland and seminal vesicles are grossly normal. Other: No pelvic mass or free pelvic fluid collections. Musculoskeletal: Scoliosis and advanced degenerative lumbar spondylosis but no worrisome bone lesions. IMPRESSION: 1. Worrisome right upper lobe pulmonary lesion measuring 2.5 x 1.4 x 1.3 cm. PET-CT may be helpful for further evaluation. No mediastinal or hilar lymphadenopathy or evidence of metastatic disease elsewhere. 2. Patchy areas of tree-in-bud appearance suggesting chronic inflammation or atypical infection such as MAC. 3. Moderate amount of endobronchial debris in the right lower lobe along with peribronchial thickening possibly due to aspiration but no definite findings for pneumonia. 4. Numerous hepatic cysts but no worrisome hepatic lesions. There is also a complex upper pole right renal cyst with areas of peripheral calcification but no solid enhancing components. 5. Advanced vascular disease. 6. Surgical changes from gastric surgery without complicating features. I do not see any definite findings for recurrent gastric mass. Electronically Signed   By: Marijo Sanes M.D.   On: 05/08/2019 08:51   Ct Abdomen Pelvis W Contrast  Result Date: 05/08/2019 CLINICAL DATA:  Remote history of gastric cancer. Patient is having progressive weight loss. Symptoms of dysphagia. EXAM: CT CHEST, ABDOMEN, AND PELVIS WITH CONTRAST TECHNIQUE: Multidetector CT imaging of the chest, abdomen and pelvis was performed following the standard protocol during bolus administration of intravenous contrast. CONTRAST:  11m OMNIPAQUE IOHEXOL 300 MG/ML  SOLN COMPARISON:  None. FINDINGS: CT CHEST FINDINGS  Cardiovascular: The heart is normal in size. There is age-related tortuosity, ectasia and calcification of the thoracic aorta but no dissection or focal aneurysm. The branch vessels are patent. Moderate three-vessel coronary artery calcifications are noted. Mediastinum/Nodes: No mediastinal or hilar mass or lymphadenopathy. The esophagus is grossly normal. Lungs/Pleura: There is a 2.5 x 1.4 x 1.3 cm lesion in the right upper lobe suspicious for neoplasm but possibly scar tissue. Without prior studies for comparison PET-CT may be  helpful for further evaluation. Patchy areas of tree-in-bud opacity in the left upper lobe and left lower lobe suggesting chronic inflammation or possibly atypical infection such as MAC. Endobronchial debris in the right lower lobe along with peribronchial thickening may suggest aspiration. No right lower lobe pneumonia. Patchy atelectasis or scarring change noted in the superior segment of the left lower lobe. Musculoskeletal: Fairly marked cachexia. No chest wall mass or axillary adenopathy. The bony structures are intact.  No bone lesions are identified. CT ABDOMEN PELVIS FINDINGS Hepatobiliary: Numerous hepatic cysts. No worrisome hepatic lesions or intrahepatic biliary dilatation. Pancreas: No mass, inflammation or ductal dilatation. Moderate pancreatic atrophy. Spleen: Normal size.  No worrisome lesions. Adrenals/Urinary Tract: The adrenal glands and kidneys are unremarkable. A right renal cyst is noted with some thin peripheral calcifications. No hydronephrosis. The bladder is grossly normal. Stomach/Bowel: Surgical changes noted involving the stomach but I do not see any findings suspicious for recurrent mass. The small bowel and colon are grossly normal. Vascular/Lymphatic: Marked tortuosity and mild ectasia of the abdominal aorta with moderate atherosclerotic calcifications but no focal aneurysm or dissection. The branch vessels are patent. There is marked tortuosity, ectasia and  calcification of the iliac arteries. 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 No obvious mesenteric or retroperitoneal mass or adenopathy. Reproductive: The prostate gland and seminal vesicles are grossly normal. Other: No pelvic mass or free pelvic fluid collections. Musculoskeletal: Scoliosis and advanced degenerative lumbar spondylosis but no worrisome bone lesions. IMPRESSION: 1. Worrisome right upper lobe pulmonary lesion measuring 2.5 x 1.4 x 1.3 cm. PET-CT may be helpful for further evaluation. No mediastinal or hilar lymphadenopathy or evidence of metastatic disease elsewhere. 2. Patchy areas of tree-in-bud appearance suggesting chronic inflammation or atypical infection such as MAC. 3. Moderate amount of endobronchial debris in the right lower lobe along with peribronchial thickening possibly due to aspiration but no definite findings for pneumonia. 4. Numerous hepatic cysts but no worrisome hepatic lesions. There is also a complex upper pole right renal cyst with areas of peripheral calcification but no solid enhancing components. 5. Advanced vascular disease. 6. Surgical changes from gastric surgery without complicating features. I do not see any definite findings for recurrent gastric mass. Electronically Signed   By: Marijo Sanes M.D.   On: 05/08/2019 08:51   Dg Carlena Hurl Op [employer] Speech Path  Result Date: 05/01/2019 CLINICAL DATA:  Weight loss.  Dysphagia. EXAM: MODIFIED BARIUM SWALLOW TECHNIQUE: Different consistencies of barium were administered orally to the patient by the Speech Pathologist. Imaging of the pharynx was performed in the lateral projection. The radiologist was present in the fluoroscopy room for this study, providing personal supervision. FLUOROSCOPY TIME:  Fluoroscopy Time:  1 minutes Number of Acquired Spot Images: 0 COMPARISON:  None. FINDINGS: There was aspiration during the study. Please see the speech pathologist's report for  full details. IMPRESSION: There was aspiration during the study. Please see the speech pathologist's report for full details. Please refer to the Speech Pathologists report for complete details and recommendations. Electronically Signed   By: Dorise Bullion III M.D   On: 05/01/2019 14:08   Dg [place]oy[place] Dys Tube Plc W/fl W/rad  Result Date: 05/12/2019 CLINICAL DATA:  Malnutrition, tube placement EXAM: NASO G TUBE PLACEMENT WITH FL AND WITH RAD CONTRAST:  10 mL Isovue-300 FLUOROSCOPY TIME:  Fluoroscopy Time:  00:30 Number of Acquired Spot Images: 1 COMPARISON:  None. FINDINGS: Using lidocaine jelly for local anesthesia, a weighted enteric feeding tube  was advanced through the right nostril to the partially resected stomach under fluoroscopic guidance without complication. A small volume of water soluble contrast was injected to confirm intragastric tube location and the tube was secured. IMPRESSION: Successful fluoroscopic placement of weighted enteric feeding tube within the partially resected stomach. Electronically Signed   By: Eddie Candle M.D.   On: 05/12/2019 09:18   Dg Addison Bailey G Tube Plc W/fl W/rad  Result Date: 04/23/2019 CLINICAL DATA:  Feeding tube needed. EXAM: NASO G TUBE PLACEMENT WITH FL AND WITH RAD CONTRAST:  None. FLUOROSCOPY TIME:  Fluoroscopy Time:  4 minutes 42 seconds Radiation Exposure Index (if provided by the fluoroscopic device): 43.0 mGy COMPARISON:  CT 04/30/2019. FINDINGS: A feeding tube was placed through the left nares down the esophagus into the stomach under fluoroscopic guidance. There were no complications. IMPRESSION: Successful feeding tube placement. Electronically Signed   By: Marcello Moores  Register   On: 04/19/2019 14:56   Dg Foot Complete Right  Result Date: 05/02/2019 Please see detailed radiograph report in office note.   PERFORMANCE STATUS (ECOG) : 3 - Symptomatic, >50% confined to bed  Review of Systems Unless otherwise noted, a complete review of systems is  negative.  Physical Exam General: frail appearing, cachectic Cardiovascular: regular rate and rhythm Pulmonary: clear ant fields Abdomen: soft, nontender, + bowel sounds, PEG noted Extremities: no edema Skin: no rashes Neurological: Weakness but otherwise nonfocal  IMPRESSION: Follow up visit. Patient lying in bed. He is comfortable appearing but frail/weak. Patient is s/p open J tube placement on 05/08/2019. Patient continues to request water.   Patient denies pain or other distressing symptoms today. Will need repeat PT eval. Suspect that he would benefit from rehab with palliative care following. Family had expressed interest in future hospice involvement in the setting of decline or if he fails to improve.   Agree with mirtazapine for depression. Hopefully, it may also help with weight gain and sleep.   Discussed with Dr. Bridgett Larsson.   PLAN: -Continue current scope of treatment.  -Will follow   Time Total: 15 minutes  Visit consisted of counseling and education dealing with the complex and emotionally intense issues of symptom management and palliative care in the setting of serious and potentially life-threatening illness.Greater than 50%  of this time was spent counseling and coordinating care related to the above assessment and plan.  Signed by: Altha Harm, PhD, NP-C [phone] (Work Cell)

## 2019-05-15 NOTE — Progress Notes (Signed)
PHARMACY CONSULT NOTE - FOLLOW UP  Pharmacy Consult for Electrolyte Monitoring and Replacement   Recent Labs: Potassium (mmol/L)  Date Value  05/15/2019 3.8  10/18/2011 4.0   Magnesium (mg/dL)  Date Value  05/15/2019 1.9   Calcium (mg/dL)  Date Value  05/15/2019 7.9 (L)   Calcium, Total (mg/dL)  Date Value  10/18/2011 9.1   Albumin (g/dL)  Date Value  04/26/2019 3.6   Phosphorus (mg/dL)  Date Value  05/15/2019 3.1   Sodium (mmol/L)  Date Value  05/15/2019 146 (H)  10/18/2011 138     Assessment: 85 year old male to restart tube feeds today. Pharmacy contacted by dietitian as patient is at risk of refeeding syndrome. MD consulted pharmacy to monitor electrolytes.  S/p PEG placement 7/29. Patient with one IV access.  Goal of Therapy:  Electrolytes WNL  Plan:  Electrolytes WNL today. No replacement indicated.  Electrolytes have been ordered with morning labs.  Pharmacy to follow and replace electrolytes as indicated.  Tawnya Crook ,PharmD Clinical Pharmacist 05/15/2019 8:48 AM

## 2019-05-15 NOTE — Progress Notes (Addendum)
Pt has stated to me multiple times tonight that he wants to go home and be with hospice. He does say that he wants everything done if his heart should stop.

## 2019-05-16 ENCOUNTER — Inpatient Hospital Stay: Payer: Medicare Other

## 2019-05-16 LAB — BASIC METABOLIC PANEL
Anion gap: 4 — ABNORMAL LOW (ref 5–15)
BUN: 24 mg/dL — ABNORMAL HIGH (ref 8–23)
CO2: 29 mmol/L (ref 22–32)
Calcium: 8.1 mg/dL — ABNORMAL LOW (ref 8.9–10.3)
Chloride: 111 mmol/L (ref 98–111)
Creatinine, Ser: 0.69 mg/dL (ref 0.61–1.24)
GFR calc Af Amer: >60 mL/min (ref >60)
GFR calc non Af Amer: >60 mL/min (ref >60)
Glucose, Bld: 132 mg/dL — ABNORMAL HIGH (ref 70–99)
Potassium: 3.3 mmol/L — ABNORMAL LOW (ref 3.5–5.1)
Sodium: 144 mmol/L (ref 135–145)

## 2019-05-16 LAB — PHOSPHORUS: Phosphorus: 1.9 mg/dL — ABNORMAL LOW (ref 2.5–4.6)

## 2019-05-16 LAB — CBC
HCT: 38.9 % — ABNORMAL LOW (ref 39.0–52.0)
Hemoglobin: 12 g/dL — ABNORMAL LOW (ref 13.0–17.0)
MCH: 30.4 pg (ref 26.0–34.0)
MCHC: 30.8 g/dL (ref 30.0–36.0)
MCV: 98.5 fL (ref 80.0–100.0)
Platelets: 152 10*3/uL (ref 150–400)
RBC: 3.95 MIL/uL — ABNORMAL LOW (ref 4.22–5.81)
RDW: 13.5 % (ref 11.5–15.5)
WBC: 9.1 10*3/uL (ref 4.0–10.5)
nRBC: 0 % (ref 0.0–0.2)

## 2019-05-16 LAB — GLUCOSE, CAPILLARY: Glucose-Capillary: 118 mg/dL — ABNORMAL HIGH (ref 70–99)

## 2019-05-16 LAB — MAGNESIUM: Magnesium: 2.1 mg/dL (ref 1.7–2.4)

## 2019-05-16 MED ORDER — POTASSIUM CHLORIDE 10 MEQ/100ML IV SOLN
10.0000 meq | INTRAVENOUS | Status: AC
  Administered 2019-05-16 (×3): 10 meq via INTRAVENOUS
  Filled 2019-05-16 (×4): qty 100

## 2019-05-16 MED ORDER — FUROSEMIDE 10 MG/ML IJ SOLN
20.0000 mg | Freq: Once | INTRAMUSCULAR | Status: AC
  Administered 2019-05-16: 20 mg via INTRAVENOUS
  Filled 2019-05-16: qty 2

## 2019-05-16 MED ORDER — GLYCERIN (LAXATIVE) 1.2 G RE SUPP
2.0000 | Freq: Once | RECTAL | Status: AC
  Administered 2019-05-16: 2.4 g via RECTAL
  Filled 2019-05-16: qty 2

## 2019-05-16 MED ORDER — POTASSIUM PHOSPHATES 15 MMOLE/5ML IV SOLN
10.0000 mmol | Freq: Once | INTRAVENOUS | Status: AC
  Administered 2019-05-16: 14:00:00 10 mmol via INTRAVENOUS
  Filled 2019-05-16: qty 3.33

## 2019-05-16 NOTE — Care Management Important Message (Signed)
Important Message  Patient Details  Name: Brian Ferguson MRN: [id number] Date of Birth: 1929-10-11   Medicare Important Message Given:  Yes     Juliann Pulse A Lafreda Casebeer 05/16/2019, 11:04 AM

## 2019-05-16 NOTE — Progress Notes (Signed)
[facility]  [phone] [phone]   Name: Brian Ferguson Date: 05/16/2019 MRN: [id number]  DOB: 08/18/1929  Patient Care Team: Olin Hauser, DO as PCP - General (Family Medicine)    REASON FOR CONSULTATION: Palliative Care consult requested for this 85 y.o. male with multiple medical problems including remote history of gastric cancer status post partial gastrectomy in 1987.  Patient has a recent history of significant weight loss over the past 4 to 6 months.  Patient underwent modified barium swallow study and was found to have severe dysphasia with aspiration and alternative nutrition was recommended.  He was subsequently referred to GI who recommended the patient be hospitalized for work-up.  Patient was admitted to the hospital on 05/12/2019 for same.  CT of the chest, abdomen, and pelvis on 04/29/2019 revealed a right upper lobe pulmonary mass concerning for malignancy.  Patient has been seen in consultation by medical oncology but is felt not to be a treatment candidate due to frailty and nutritional status.  Patient is status post EGD on 04/21/2019 with PEG attempted but insertion was not successful due to post gastrectomy anatomy.  Patient is pending surgical J-tube.  He was referred to palliative care to help address goals.   CODE STATUS: Full code  PAST MEDICAL HISTORY: Past Medical History:  Diagnosis Date   Cancer ([facility]) 1987   stomach. 2/3 of stomach removed   Confusion    alert and oriented at times   Coronary artery disease    Glaucoma    History of blood clots    Malabsorption syndrome     PAST SURGICAL HISTORY:  Past Surgical History:  Procedure Laterality Date   GASTROSTOMY N/A 04/19/2019   Procedure: INSERTION OF GASTROSTOMY TUBE;  Surgeon: Benjamine Sprague, DO;  Location: [facility] ORS;  Service: General;  Laterality: N/A;   HERNIA REPAIR     PEG PLACEMENT N/A 05/15/2019   Procedure:  PERCUTANEOUS ENDOSCOPIC GASTROSTOMY (PEG) PLACEMENT;  Surgeon: Toledo, Benay Pike, MD;  Location: [facility] ENDOSCOPY;  Service: Gastroenterology;  Laterality: N/A;   [place]   removed 2/3 of stomach d/t cancer    HEMATOLOGY/ONCOLOGY HISTORY:  Oncology History   No history exists.    ALLERGIES:  has No Known Allergies.  MEDICATIONS:  Current Facility-Administered Medications  Medication Dose Route Frequency Provider Last Rate Last Dose   acetaminophen (TYLENOL) tablet 650 mg  650 mg Oral Q6H PRN Lysle Pearl, Isami, DO       Or   acetaminophen (TYLENOL) suppository 650 mg  650 mg Rectal Q6H PRN Sakai, Isami, DO       bisacodyl (DULCOLAX) EC tablet 5 mg  5 mg Oral Daily PRN Sakai, Isami, DO       Chlorhexidine Gluconate Cloth 2 % PADS 6 each  6 each Topical Q0600 Sakai, Isami, DO   6 each at 05/16/19 0617   docusate sodium (COLACE) capsule 100 mg  100 mg Oral BID Sakai, Isami, DO       enoxaparin (LOVENOX) injection 40 mg  40 mg Subcutaneous Q24H Sakai, Isami, DO   40 mg at 05/15/19 0750   HYDROcodone-acetaminophen (NORCO/VICODIN) 5-325 MG per tablet 1-2 tablet  1-2 tablet Oral Q4H PRN Lysle Pearl, Isami, DO       latanoprost (XALATAN) 0.005 % ophthalmic solution 1 drop  1 drop Both Eyes QHS Sakai, Isami, DO   1 drop at 05/15/19 2239   MEDLINE mouth rinse  15 mL Mouth Rinse BID  Lysle Pearl, Isami, DO   15 mL at 05/15/19 2240   morphine 2 MG/ML injection 1 mg  1 mg Intravenous Q3H PRN Lysle Pearl, Isami, DO       ondansetron (ZOFRAN) tablet 4 mg  4 mg Oral Q6H PRN Lysle Pearl, Isami, DO       Or   ondansetron [facility]) injection 4 mg  4 mg Intravenous Q6H PRN Sakai, Isami, DO   4 mg at 05/11/19 1734   potassium chloride 10 mEq in 100 mL IVPB  10 mEq Intravenous Q1 Hr x 3 Ellington, Abby K, RPH       potassium PHOSPHATE 10 mmol in dextrose 5 % 250 mL infusion  10 mmol Intravenous Once Ellington, Abby K, RPH       traZODone (DESYREL) tablet 25 mg  25 mg Oral QHS PRN Sakai, Isami, DO         VITAL SIGNS: BP (!) 142/93 (BP Location: Left Arm)    Pulse 86    Temp 97.6 F (36.4 C) (Oral)    Resp 18    Ht _0  (1.778 m)    Wt 117 lb 15.1 oz (53.5 kg)    SpO2 91%    BMI 16.92 kg/m  Filed Weights   05/13/19 0622 05/12/2019 0408 04/27/2019 1434  Weight: 115 lb 8 oz (52.4 kg) 118 lb (53.5 kg) 117 lb 15.1 oz (53.5 kg)    Estimated body mass index is 16.92 kg/m as calculated from the following:   Height as of this encounter: _1  (1.778 m).   Weight as of this encounter: 117 lb 15.1 oz (53.5 kg).  LABS: CBC:    Component Value Date/Time   WBC 9.1 05/16/2019 0502   HGB 12.0 (L) 05/16/2019 0502   HGB 13.8 10/18/2011 1442   HCT 38.9 (L) 05/16/2019 0502   HCT 43.3 10/18/2011 1442   PLT 152 05/16/2019 0502   PLT 157 10/18/2011 1442   MCV 98.5 05/16/2019 0502   MCV 94 10/18/2011 1442   NEUTROABS 6,909 03/18/2019 1122   NEUTROABS 4.0 10/18/2011 1442   LYMPHSABS 1,513 03/18/2019 1122   LYMPHSABS 1.5 10/18/2011 1442   MONOABS 0.7 10/18/2011 1442   EOSABS 47 03/18/2019 1122   EOSABS 0.1 10/18/2011 1442   BASOSABS 38 03/18/2019 1122   BASOSABS 0.0 10/18/2011 1442   Comprehensive Metabolic Panel:    Component Value Date/Time   NA 144 05/16/2019 0502   NA 138 10/18/2011 1442   K 3.3 (L) 05/16/2019 0502   K 4.0 10/18/2011 1442   CL 111 05/16/2019 0502   CL 101 10/18/2011 1442   CO2 29 05/16/2019 0502   CO2 31 10/18/2011 1442   BUN 24 (H) 05/16/2019 0502   BUN 24 (H) 10/18/2011 1442   CREATININE 0.69 05/16/2019 0502   CREATININE 0.83 03/18/2019 1122   GLUCOSE 132 (H) 05/16/2019 0502   GLUCOSE 106 (H) 10/18/2011 1442   CALCIUM 8.1 (L) 05/16/2019 0502   CALCIUM 9.1 10/18/2011 1442   AST 22 05/05/2019 1416   ALT 17 05/06/2019 1416   ALKPHOS 62 04/21/2019 1416   BILITOT 0.8 05/05/2019 1416   PROT 6.9 04/16/2019 1416   ALBUMIN 3.6 05/16/2019 1416    RADIOGRAPHIC STUDIES: Ct Head Wo Contrast  Result Date: 05/15/2019 CLINICAL DATA:  Dysphagia.  Lung nodule. EXAM:  CT HEAD WITHOUT CONTRAST TECHNIQUE: Contiguous axial images were obtained from the base of the skull through the vertex without intravenous contrast. COMPARISON:  None. FINDINGS: Brain: Moderate atrophy. 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 Chronic ischemic change in the white matter bilaterally. Small chronic infarct left cerebellum. Negative for acute infarct. Negative for hemorrhage mass or edema. No midline shift. Vascular: Negative for hyperdense vessel. Atherosclerotic calcification in the cavernous carotid bilaterally. Skull: Negative Sinuses/Orbits: Paranasal sinuses clear.  Bilateral cataract surgery Other: None IMPRESSION: Atrophy and chronic ischemic change.  No acute abnormality. Electronically Signed   By: Franchot Gallo M.D.   On: 05/15/2019 18:40   Ct Chest W Contrast  Result Date: 05/08/2019 CLINICAL DATA:  Remote history of gastric cancer. Patient is having progressive weight loss. Symptoms of dysphagia. EXAM: CT CHEST, ABDOMEN, AND PELVIS WITH CONTRAST TECHNIQUE: Multidetector CT imaging of the chest, abdomen and pelvis was performed following the standard protocol during bolus administration of intravenous contrast. CONTRAST:  64m OMNIPAQUE IOHEXOL 300 MG/ML  SOLN COMPARISON:  None. FINDINGS: CT CHEST FINDINGS Cardiovascular: The heart is normal in size. There is age-related tortuosity, ectasia and calcification of the thoracic aorta but no dissection or focal aneurysm. The branch vessels are patent. Moderate three-vessel coronary artery calcifications are noted. Mediastinum/Nodes: No mediastinal or hilar mass or lymphadenopathy. The esophagus is grossly normal. Lungs/Pleura: There is a 2.5 x 1.4 x 1.3 cm lesion in the right upper lobe suspicious for neoplasm but possibly scar tissue. Without prior studies for comparison PET-CT may be helpful for further evaluation. Patchy areas of tree-in-bud opacity in the left upper lobe and left lower lobe suggesting chronic inflammation  or possibly atypical infection such as MAC. Endobronchial debris in the right lower lobe along with peribronchial thickening may suggest aspiration. No right lower lobe pneumonia. Patchy atelectasis or scarring change noted in the superior segment of the left lower lobe. Musculoskeletal: Fairly marked cachexia. No chest wall mass or axillary adenopathy. The bony structures are intact.  No bone lesions are identified. CT ABDOMEN PELVIS FINDINGS Hepatobiliary: Numerous hepatic cysts. No worrisome hepatic lesions or intrahepatic biliary dilatation. Pancreas: No mass, inflammation or ductal dilatation. Moderate pancreatic atrophy. Spleen: Normal size.  No worrisome lesions. Adrenals/Urinary Tract: The adrenal glands and kidneys are unremarkable. A right renal cyst is noted with some thin peripheral calcifications. No hydronephrosis. The bladder is grossly normal. Stomach/Bowel: Surgical changes noted involving the stomach but I do not see any findings suspicious for recurrent mass. The small bowel and colon are grossly normal. Vascular/Lymphatic: Marked tortuosity and mild ectasia of the abdominal aorta with moderate atherosclerotic calcifications but no focal aneurysm or dissection. The branch vessels are patent. There is marked tortuosity, ectasia and calcification of the iliac arteries. 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 No obvious mesenteric or retroperitoneal mass or adenopathy. Reproductive: The prostate gland and seminal vesicles are grossly normal. Other: No pelvic mass or free pelvic fluid collections. Musculoskeletal: Scoliosis and advanced degenerative lumbar spondylosis but no worrisome bone lesions. IMPRESSION: 1. Worrisome right upper lobe pulmonary lesion measuring 2.5 x 1.4 x 1.3 cm. PET-CT may be helpful for further evaluation. No mediastinal or hilar lymphadenopathy or evidence of metastatic disease elsewhere. 2. Patchy areas of tree-in-bud appearance  suggesting chronic inflammation or atypical infection such as MAC. 3. Moderate amount of endobronchial debris in the right lower lobe along with peribronchial thickening possibly due to aspiration but no definite findings for pneumonia. 4. Numerous hepatic cysts but no worrisome hepatic lesions. There is also a complex upper pole right renal cyst with areas of peripheral calcification but no solid enhancing components. 5. Advanced vascular disease. 6. Surgical changes  from gastric surgery without complicating features. I do not see any definite findings for recurrent gastric mass. Electronically Signed   By: Marijo Sanes M.D.   On: 05/08/2019 08:51   Ct Abdomen Pelvis W Contrast  Result Date: 05/08/2019 CLINICAL DATA:  Remote history of gastric cancer. Patient is having progressive weight loss. Symptoms of dysphagia. EXAM: CT CHEST, ABDOMEN, AND PELVIS WITH CONTRAST TECHNIQUE: Multidetector CT imaging of the chest, abdomen and pelvis was performed following the standard protocol during bolus administration of intravenous contrast. CONTRAST:  75m OMNIPAQUE IOHEXOL 300 MG/ML  SOLN COMPARISON:  None. FINDINGS: CT CHEST FINDINGS Cardiovascular: The heart is normal in size. There is age-related tortuosity, ectasia and calcification of the thoracic aorta but no dissection or focal aneurysm. The branch vessels are patent. Moderate three-vessel coronary artery calcifications are noted. Mediastinum/Nodes: No mediastinal or hilar mass or lymphadenopathy. The esophagus is grossly normal. Lungs/Pleura: There is a 2.5 x 1.4 x 1.3 cm lesion in the right upper lobe suspicious for neoplasm but possibly scar tissue. Without prior studies for comparison PET-CT may be helpful for further evaluation. Patchy areas of tree-in-bud opacity in the left upper lobe and left lower lobe suggesting chronic inflammation or possibly atypical infection such as MAC. Endobronchial debris in the right lower lobe along with peribronchial  thickening may suggest aspiration. No right lower lobe pneumonia. Patchy atelectasis or scarring change noted in the superior segment of the left lower lobe. Musculoskeletal: Fairly marked cachexia. No chest wall mass or axillary adenopathy. The bony structures are intact.  No bone lesions are identified. CT ABDOMEN PELVIS FINDINGS Hepatobiliary: Numerous hepatic cysts. No worrisome hepatic lesions or intrahepatic biliary dilatation. Pancreas: No mass, inflammation or ductal dilatation. Moderate pancreatic atrophy. Spleen: Normal size.  No worrisome lesions. Adrenals/Urinary Tract: The adrenal glands and kidneys are unremarkable. A right renal cyst is noted with some thin peripheral calcifications. No hydronephrosis. The bladder is grossly normal. Stomach/Bowel: Surgical changes noted involving the stomach but I do not see any findings suspicious for recurrent mass. The small bowel and colon are grossly normal. Vascular/Lymphatic: Marked tortuosity and mild ectasia of the abdominal aorta with moderate atherosclerotic calcifications but no focal aneurysm or dissection. The branch vessels are patent. There is marked tortuosity, ectasia and calcification of the iliac arteries. 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 No obvious mesenteric or retroperitoneal mass or adenopathy. Reproductive: The prostate gland and seminal vesicles are grossly normal. Other: No pelvic mass or free pelvic fluid collections. Musculoskeletal: Scoliosis and advanced degenerative lumbar spondylosis but no worrisome bone lesions. IMPRESSION: 1. Worrisome right upper lobe pulmonary lesion measuring 2.5 x 1.4 x 1.3 cm. PET-CT may be helpful for further evaluation. No mediastinal or hilar lymphadenopathy or evidence of metastatic disease elsewhere. 2. Patchy areas of tree-in-bud appearance suggesting chronic inflammation or atypical infection such as MAC. 3. Moderate amount of endobronchial debris in the  right lower lobe along with peribronchial thickening possibly due to aspiration but no definite findings for pneumonia. 4. Numerous hepatic cysts but no worrisome hepatic lesions. There is also a complex upper pole right renal cyst with areas of peripheral calcification but no solid enhancing components. 5. Advanced vascular disease. 6. Surgical changes from gastric surgery without complicating features. I do not see any definite findings for recurrent gastric mass. Electronically Signed   By: PMarijo SanesM.D.   On: 05/08/2019 08:51   Dg SCarlena HurlOp [employer] Speech Path  Result Date: 05/01/2019 CLINICAL DATA:  Weight loss.  Dysphagia. EXAM: MODIFIED BARIUM SWALLOW TECHNIQUE: Different consistencies of barium were administered orally to the patient by the Speech Pathologist. Imaging of the pharynx was performed in the lateral projection. The radiologist was present in the fluoroscopy room for this study, providing personal supervision. FLUOROSCOPY TIME:  Fluoroscopy Time:  1 minutes Number of Acquired Spot Images: 0 COMPARISON:  None. FINDINGS: There was aspiration during the study. Please see the speech pathologist's report for full details. IMPRESSION: There was aspiration during the study. Please see the speech pathologist's report for full details. Please refer to the Speech Pathologists report for complete details and recommendations. Electronically Signed   By: Dorise Bullion III M.D   On: 05/01/2019 14:08   Dg [place]oyce Dys Tube Plc W/fl W/rad  Result Date: 05/12/2019 CLINICAL DATA:  Malnutrition, tube placement EXAM: NASO G TUBE PLACEMENT WITH FL AND WITH RAD CONTRAST:  10 mL Isovue-300 FLUOROSCOPY TIME:  Fluoroscopy Time:  00:30 Number of Acquired Spot Images: 1 COMPARISON:  None. FINDINGS: Using lidocaine jelly for local anesthesia, a weighted enteric feeding tube was advanced through the right nostril to the partially resected stomach under fluoroscopic guidance without complication. A small volume  of water soluble contrast was injected to confirm intragastric tube location and the tube was secured. IMPRESSION: Successful fluoroscopic placement of weighted enteric feeding tube within the partially resected stomach. Electronically Signed   By: Eddie Candle M.D.   On: 05/12/2019 09:18   Dg Addison Bailey G Tube Plc W/fl W/rad  Result Date: 04/29/2019 CLINICAL DATA:  Feeding tube needed. EXAM: NASO G TUBE PLACEMENT WITH FL AND WITH RAD CONTRAST:  None. FLUOROSCOPY TIME:  Fluoroscopy Time:  4 minutes 42 seconds Radiation Exposure Index (if provided by the fluoroscopic device): 43.0 mGy COMPARISON:  CT 05/05/2019. FINDINGS: A feeding tube was placed through the left nares down the esophagus into the stomach under fluoroscopic guidance. There were no complications. IMPRESSION: Successful feeding tube placement. Electronically Signed   By: Marcello Moores  Register   On: 04/26/2019 14:56   Dg Foot Complete Right  Result Date: 05/02/2019 Please see detailed radiograph report in office note.   PERFORMANCE STATUS (ECOG) : 3 - Symptomatic, >50% confined to bed  Review of Systems Unless otherwise noted, a complete review of systems is negative.  Physical Exam General: frail appearing, cachectic Pulmonary: Unlabored Abdomen: soft, nontender, + bowel sounds, PEG noted Extremities: no edema Skin: no rashes Neurological: Weakness but otherwise nonfocal  IMPRESSION: Follow up visit.  Patient is lying in bed and denies any acute changes or concerns.  He denies distressing symptoms.  He continues to request water.  I met with patient today and daughter, [name], participated by phone.  [name] is patient's healthcare power of attorney.  Together, we reviewed patient's current medical problems and options for future care.  Both verbalized an understanding of the risks associated with eating and drinking including aspiration and possible death.  Patient continues to verbalize a desire to eat and drink for his comfort and we  discussed the role of comfort feedings.  We explored options for care following discharge from the hospital including rehab versus home health versus hospice.  Daughter requested that patient discharge to rehab and then would like hospice involvement at home with a focus on his comfort and quality of life.  Patient says he agrees with that decision.  We discussed CODE STATUS.  Patient has previously completed a living will and had verbalized a desire not to be resuscitated to both daughters.  Today, patient says that he would not want to be resuscitated nor have his life prolonged artificially on machines and daughter agreed.  Will change CODE STATUS to DNR to reflect patient's decision.  Case discussed with care management and nursing  PLAN: -Continue current scope of treatment.  -Recommend rehab with palliative care and then likely hospice at home -Social work referral -Recommend PT eval -DNR   Time Total: 45 minutes  Visit consisted of counseling and education dealing with the complex and emotionally intense issues of symptom management and palliative care in the setting of serious and potentially life-threatening illness.Greater than 50%  of this time was spent counseling and coordinating care related to the above assessment and plan.  Signed by: Altha Harm, PhD, NP-C [phone] (Work Cell)

## 2019-05-16 NOTE — Progress Notes (Signed)
PHARMACY CONSULT NOTE - FOLLOW UP  Pharmacy Consult for Electrolyte Monitoring and Replacement   Recent Labs: Potassium (mmol/L)  Date Value  05/16/2019 3.3 (L)  10/18/2011 4.0   Magnesium (mg/dL)  Date Value  05/16/2019 2.1   Calcium (mg/dL)  Date Value  05/16/2019 8.1 (L)   Calcium, Total (mg/dL)  Date Value  10/18/2011 9.1   Albumin (g/dL)  Date Value  04/22/2019 3.6   Phosphorus (mg/dL)  Date Value  05/16/2019 1.9 (L)   Sodium (mmol/L)  Date Value  05/16/2019 144  10/18/2011 138     Assessment: 85 year old male to restart tube feeds today. Pharmacy contacted by dietitian as patient is at risk of refeeding syndrome. MD consulted pharmacy to monitor electrolytes.  S/p PEG placement 7/29. Patient with one IV access. He is not on any fluids at this time. Tube feeds are still on hold.  Goal of Therapy:  Electrolytes WNL  Plan:  Will give potassium 10 mEq IV x 3 runs followed by potassium phosphate 10 mmol IV x 1.  Electrolytes have been ordered with morning labs.  Pharmacy to follow and replace electrolytes as indicated.  Tawnya Crook ,PharmD Clinical Pharmacist 05/16/2019 11:32 AM

## 2019-05-16 NOTE — Progress Notes (Signed)
Physical Therapy Treatment Patient Details Name: Brian Ferguson MRN: [id number] DOB: 01/18/1929 Today's Date: 05/16/2019    History of Present Illness  85 y.o. male with a known history of gastric cancer in 1987 sent in by Dr. Bonna Gains for IV fluid. Has been having progressive weight loss for last 6 to 7 months.  Pt had NG tube placed 7/24 and he pulled it out later that day, now is s/p PEG tube 7/29.    PT Comments    Pt with significant change in mobility status post surgery.  He needed max assist to even get to sitting at EOB and struggled to maintain balance (leaning L) even with a lot of set up and b/l UE use.  Multiple attempts at standing required total assist and even so he was unable to attain fully upright with b/l LEs staying bent, inability to push with UEs effectively on the walker and essentially leaning all his weight to the right and onto this PT's body.  Pt calling out about feeling as though he would fall and ultimately he did very poorly with mobility tasks this date.  Pt had walked around the nurses' station each of the last 2 PT visits, showed profound change in status and is clearly unable to return home at this time.  Change in d/c plan with likely SNF.   Follow Up Recommendations  SNF     Equipment Recommendations  (TBD at next venue of care)    Recommendations for Other Services       Precautions / Restrictions Precautions Precautions: Fall Restrictions Weight Bearing Restrictions: No    Mobility  Bed Mobility Overal bed mobility: Needs Assistance Bed Mobility: Supine to Sit;Sit to Supine     Supine to sit: Max assist Sit to supine: Max assist   General bed mobility comments: Pt with considerable change in mobility this date. Showed poor tolerance and and ability to even assist, overall very limited needing heavy assist  Transfers Overall transfer level: Needs assistance Equipment used: Rolling walker (2 wheeled) Transfers: Sit to/from Stand Sit to  Stand: Total assist         General transfer comment: 3 attempts at standing with very little success.  Very heavy assist and still unable to fully extend LEs, leaning heavily to the L and ultimately showing extremely different strength/mobility from previous PT sessions.  Ambulation/Gait             General Gait Details: unable to even attain upright standing, not appropriate/able to ambulate this date   Stairs             Wheelchair Mobility    Modified Rankin (Stroke Patients Only)       Balance Overall balance assessment: Needs assistance Sitting-balance support: Bilateral upper extremity supported Sitting balance-Leahy Scale: Poor Sitting balance - Comments: leaning to the L in sitting, poor tolerance     Standing balance-Leahy Scale: Zero Standing balance comment: unable to attain full upright, calling out "I'm going to fall, I'm falling" t/o the effort with total assist from PT to maintain even partial standing                            Cognition Arousal/Alertness: Awake/alert Behavior During Therapy: WFL for tasks assessed/performed Overall Cognitive Status: Within Functional Limits for tasks assessed  Exercises General Exercises - Lower Extremity Ankle Circles/Pumps: AROM;10 reps Quad Sets: Strengthening;AROM;10 reps Heel Slides: 10 reps;AAROM;AROM(lightly resisted leg extensions) Hip ABduction/ADduction: AROM;10 reps;AAROM    General Comments        Pertinent Vitals/Pain Pain Assessment: 0-10 Pain Score: 5  Pain Location: stomach, surgical site    Home Living                      Prior Function            PT Goals (current goals can now be found in the care plan section) Progress towards PT goals: Not progressing toward goals - comment(Pt much weaker now post surgery)    Frequency    Min 2X/week      PT Plan Discharge plan needs to be updated     Co-evaluation              AM-PAC PT "6 Clicks" Mobility   Outcome Measure  Help needed turning from your back to your side while in a flat bed without using bedrails?: None Help needed moving from lying on your back to sitting on the side of a flat bed without using bedrails?: A Little Help needed moving to and from a bed to a chair (including a wheelchair)?: A Little Help needed standing up from a chair using your arms (e.g., wheelchair or bedside chair)?: A Little Help needed to walk in hospital room?: A Little Help needed climbing 3-5 steps with a railing? : A Little 6 Click Score: 19    End of Session Equipment Utilized During Treatment: Gait belt Activity Tolerance: Patient tolerated treatment well Patient left: with bed alarm set;with call bell/phone within reach Nurse Communication: Mobility status PT Visit Diagnosis: Muscle weakness (generalized) (M62.81);Difficulty in walking, not elsewhere classified (R26.2)     Time: 7494-4967 PT Time Calculation (min) (ACUTE ONLY): 29 min  Charges:  $Therapeutic Exercise: 8-22 mins $Therapeutic Activity: 8-22 mins                     Kreg Shropshire, DPT 05/16/2019, 5:00 PM

## 2019-05-16 NOTE — TOC Progression Note (Signed)
Transition of Care [facility]) - Progression Note    Patient Details  Name: Brian Ferguson MRN: [id number] Date of Birth: 1929/07/03  [facility] CM/SW Contact  Shelbie Hutching, RN Phone Number: 05/16/2019, 3:42 PM  Clinical Narrative:    After patient and daughter's have talked with palliative care they have decided that the best plan will be for patient to go to SNF at discharge initially and then transition to home with hospice.  The patient's family needs some time to get everything ready for him to come home.  Bed search has been started.     Expected Discharge Plan: [place] Barriers to Discharge: Continued Medical Work up  Expected Discharge Plan and Services Expected Discharge Plan: [place]   Discharge Planning Services: CM Consult   Living arrangements for the past 2 months: Single Family Home                                       Social Determinants of Health (SDOH) Interventions    Readmission Risk Interventions No flowsheet data found.

## 2019-05-16 NOTE — Progress Notes (Signed)
Patient is 84%, asymptomatic on RA. Patient placed on 2L [place] and MD Chen notified. Iv 20mg  lasix ordered and portable chest xray.

## 2019-05-16 NOTE — Progress Notes (Addendum)
[facility] [place] NAME: Brian Ferguson    MR#:  [id number]  DATE OF BIRTH:  23-Jun-1929  SUBJECTIVE:   The patient is lethargic, s/p surgical J-tube placement. REVIEW OF SYSTEMS:    Review of Systems  Constitutional: Negative for fever, chills ++ poor appetite with weight loss HENT: Negative for ear pain, nosebleeds, congestion, facial swelling, rhinorrhea, neck pain, neck stiffness and ear discharge.   Respiratory: Negative for cough, shortness of breath, wheezing  Cardiovascular: Negative for chest pain, palpitations and leg swelling.  Gastrointestinal: Negative for heartburn, abdominal pain, vomiting, diarrhea or consitpation Genitourinary: Negative for dysuria, urgency, frequency, hematuria Musculoskeletal: Negative for back pain or joint pain Neurological: Negative for dizziness, seizures, syncope, focal weakness,  numbness and headaches.  Hematological: Does not bruise/bleed easily.  Psychiatric/Behavioral: Negative for hallucinations, confusion, dysphoric mood Tolerating Diet: npo DRUG ALLERGIES:  No Known Allergies  VITALS:  Blood pressure (!) 142/93, pulse 86, temperature 97.6 F (36.4 C), temperature source Oral, resp. rate 18, height 5\' 10"  (1.778 m), weight 53.5 kg, SpO2 91 %.  PHYSICAL EXAMINATION:  Constitutional: Appears frail thin cachectic no distress.  Looks lethargic. HENT: Normocephalic. Eyes: Conjunctivae and EOM are normal. PERRLA, no scleral icterus.  Neck: Normal ROM. Neck supple. No JVD. No tracheal deviation. CVS: RRR, S1/S2 +, no murmurs, no gallops, no carotid bruit.  Pulmonary: Effort and breath sounds normal, no stridor, rhonchi, wheezes, rales.  Abdominal: Soft.  No bowel sounds,  no distension, tenderness on palpatioin, rebound or guarding.  Musculoskeletal: Normal range of motion. No edema and no tenderness.  Neuro: Alert. CN 2-12 grossly intact. No focal deficits. Skin: Skin is warm and dry. No rash  noted. Psychiatric: Lethargic. LABORATORY PANEL:   CBC Recent Labs  Lab 05/16/19 0502  WBC 9.1  HGB 12.0*  HCT 38.9*  PLT 152   ------------------------------------------------------------------------------------------------------------------  Chemistries  Recent Labs  Lab 05/16/19 0502  NA 144  K 3.3*  CL 111  CO2 29  GLUCOSE 132*  BUN 24*  CREATININE 0.69  CALCIUM 8.1*  MG 2.1   ------------------------------------------------------------------------------------------------------------------  Cardiac Enzymes No results for input(s): TROPONINI in the last 168 hours. ------------------------------------------------------------------------------------------------------------------  RADIOLOGY:  Ct Head Wo Contrast  Result Date: 05/15/2019 CLINICAL DATA:  Dysphagia.  Lung nodule. EXAM: CT HEAD WITHOUT CONTRAST TECHNIQUE: Contiguous axial images were obtained from the base of the skull through the vertex without intravenous contrast. COMPARISON:  None. FINDINGS: Brain: Moderate atrophy. 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 Chronic ischemic change in the white matter bilaterally. Small chronic infarct left cerebellum. Negative for acute infarct. Negative for hemorrhage mass or edema. No midline shift. Vascular: Negative for hyperdense vessel. Atherosclerotic calcification in the cavernous carotid bilaterally. Skull: Negative Sinuses/Orbits: Paranasal sinuses clear.  Bilateral cataract surgery Other: None IMPRESSION: Atrophy and chronic ischemic change.  No acute abnormality. Electronically Signed   By: Franchot Gallo M.D.   On: 05/15/2019 18:40   ASSESSMENT AND PLAN:   85 year old male with history of gastric cancer and two thirds of stomach removed who presented from GI office as a direct admit due to dysphasia and weight loss.  1.  Severe malnutrition with severe cachexia, weight loss and dysphasia: S/p  dobhoff tube placement but pulled off by  patient. History of gastric cancer.  S/p surgery. Per radiologist, it is very difficult to get a J-tube placement.  He placed weighted enteric feeding tube for tube feeding.  The patient tolerated tube feeding.  Watch  for refeeding syndrome. Dr. Alice Reichert requested a surgical consult.  S/p Surgical J-tube placement.  Follow-up with Dr. Lysle Pearl to start tube feeding.  Right upper lobe pulmonary lesion. Work-up after better nutrition status per Dr. Tasia Catchings..  Hypokalemia.  potassium supplement. Hypophosphatemia. supplement. Hypernatremia. improved with D5 half-normal saline IV.  Leukocytosis, possible due to reactio, improved. Dehydration.  IV fluid support and follow-up BMP. Palliative care nurse practitioner Josh discussed with the patient and his daughter, they decided changed to DNR status. I discussed with the patient's daughter [name].  She wants patient to be placed to skilled nursing facility with palliative care follow-up. Management plans discussed with the patient and he is in agreement.  CODE STATUS: DNR.  TOTAL TIME TAKING CARE OF THIS PATIENT: 30 minutes. POSSIBLE D/C 2 days, DEPENDING ON CLINICAL CONDITION.  Demetrios Loll M.D on 05/16/2019 at 9:55 AM  Between 7am to 6pm - Pager - [phone] After 6pm go to [email or web address] - password [email or web address] [facility]  Office  [phone]  CC: Primary care physician; Olin Hauser, DO  Note: This dictation was prepared with Dragon dictation along with smaller phrase technology. Any transcriptional errors that result from this process are unintentional.

## 2019-05-16 NOTE — Progress Notes (Signed)
Subjective:  CC: Brian Ferguson is a 85 y.o. male  Hospital stay day 9, malnutrition POD #2 open jejunostomy tube placement  HPI: No acute issues overnight.  Still asking for water. No flatus or BM  ROS:  General: Denies weight loss, weight gain, fatigue, fevers, chills, and night sweats. Heart: Denies chest pain, palpitations, racing heart, irregular heartbeat, leg pain or swelling, and decreased activity tolerance. Respiratory: Denies breathing difficulty, shortness of breath, wheezing, cough, and sputum. GI: Denies change in appetite, heartburn, nausea, vomiting, constipation, diarrhea, and blood in stool. GU: Denies difficulty urinating, pain with urinating, urgency, frequency, blood in urine.   Objective:   Temp:  [97.6 F (36.4 C)-98.6 F (37 C)] 97.6 F (36.4 C) (07/31 0334) Pulse Rate:  [86-94] 86 (07/31 0334) Resp:  [14-18] 18 (07/31 0334) BP: (136-154)/(68-95) 142/93 (07/31 0334) SpO2:  [90 %-96 %] 91 % (07/31 0334)     Height: 5\' 10"  (177.8 cm) Weight: 53.5 kg BMI (Calculated): 16.92   Intake/Output this shift:   Intake/Output Summary (Last 24 hours) at 05/16/2019 1251 Last data filed at 05/16/2019 0222 Gross per 24 hour  Intake 480.27 ml  Output 675 ml  Net -194.73 ml    Constitutional :  alert, cooperative, appears stated age, cachectic and no distress  Respiratory:  clear to auscultation bilaterally  Cardiovascular:  regular rate and rhythm  Gastrointestinal: soft, still with diffuse distention, decreased tenderness throughout, consistent with ileus.  jtube site recently changes and clean.   Skin: Cool and moist.   Psychiatric: Normal affect, non-agitated, not confused       LABS:  CMP Latest Ref Rng & Units 05/16/2019 05/15/2019 05/08/2019  Glucose 70 - 99 mg/dL 132(H) 138(H) -  BUN 8 - 23 mg/dL 24(H) 27(H) -  Creatinine 0.61 - 1.24 mg/dL 0.69 0.92 0.79  Sodium 135 - 145 mmol/L 144 146(H) -  Potassium 3.5 - 5.1 mmol/L 3.3(L) 3.8 -  Chloride 98 - 111 mmol/L  111 112(H) -  CO2 22 - 32 mmol/L 29 26 -  Calcium 8.9 - 10.3 mg/dL 8.1(L) 7.9(L) -  Total Protein 6.5 - 8.1 g/dL - - -  Total Bilirubin 0.3 - 1.2 mg/dL - - -  Alkaline Phos 38 - 126 U/L - - -  AST 15 - 41 U/L - - -  ALT 0 - 44 U/L - - -   CBC Latest Ref Rng & Units 05/16/2019 05/15/2019 04/20/2019  WBC 4.0 - 10.5 K/uL 9.1 13.0(H) 10.6(H)  Hemoglobin 13.0 - 17.0 g/dL 12.0(L) 12.4(L) 13.0  Hematocrit 39.0 - 52.0 % 38.9(L) 40.5 42.7  Platelets 150 - 400 K/uL 152 158 161    RADS: n/a Assessment:   Cachexia, malnourishment secondary to dysphagia. Incidental lung lesion. Discussed case with oncology and they recommend maximizing nutritional status first in case lung lesion requires additional treatment. Recommended proceeding with feeding tube placement.  S/p open Jtube placement. Postop ileus. No change on exam today.  Continue jtube to gravity, Will consider restarting tube feeds when clinically better.  Pending speech eval to see if any change in swallowing, since patient still continues to ask about oral intake.  Will continue to monitor  PEG tube should be at 4cm mark on the skin.  The balloon should only be filled up to 66ml of fluid in order to prevent an obstruction.

## 2019-05-16 NOTE — Progress Notes (Signed)
Pt is 88%, with shallow breathing on 2L. Increased O2 to 4L pt is now at 89%. MD messaged on secure chat.

## 2019-05-16 NOTE — NC FL2 (Signed)
[place] LEVEL OF CARE SCREENING TOOL     IDENTIFICATION  Patient Name: Brian Ferguson Birthdate: 19-Jan-1929 Sex: male Admission Date (Current Location): 04/28/2019  [place] and [place] Number:  Engineering geologist and Address:  [facility], [address] [place], [place] [address]      Provider Number: [id number]  Attending Physician Name and Address:  Demetrios Loll, MD  Relative Name and Phone Number:  Aveion Nguyen [phone] and Jeanie Cooks [phone]    Current Level of Care: Hospital Recommended Level of Care: [place] Prior Approval Number:    Date Approved/Denied:   PASRR Number: [id number]  Discharge Plan: SNF    Current Diagnoses: Patient Active Problem List   Diagnosis Date Noted  . Protein-calorie malnutrition, severe 04/24/2019  . Palliative care encounter   . Dysphagia   . Lung mass   . Cachexia ([place]) 05/01/2019  . History of gastric cancer 03/18/2019  . Malnutrition ([place]) 03/18/2019  . Recurrent falls 03/18/2019  . Generalized weakness 03/18/2019    Orientation RESPIRATION BLADDER Height & Weight     Self, Time, Situation, Place  Normal Incontinent, External catheter Weight: 53.5 kg Height:  5\' 10"  (177.8 cm)  BEHAVIORAL SYMPTOMS/MOOD NEUROLOGICAL BOWEL NUTRITION STATUS      Incontinent Feeding tube(J tube)  AMBULATORY STATUS COMMUNICATION OF NEEDS Skin   Extensive Assist Verbally Surgical wounds, Other (Comment)(J tube Abd, surgical incision abd)                       Personal Care Assistance Level of Assistance  Bathing, Feeding, Dressing Bathing Assistance: Maximum assistance Feeding assistance: Maximum assistance Dressing Assistance: Maximum assistance     Functional Limitations Info             SPECIAL CARE FACTORS FREQUENCY  PT (By licensed PT), OT (By licensed OT)     PT Frequency: 5 times per week OT Frequency: 5 times per week            Contractures  Contractures Info: Not present    Additional Factors Info  Code Status, Allergies Code Status Info: DNR Allergies Info: NKA           Current Medications (05/16/2019):  This is the current hospital active medication list Current Facility-Administered Medications  Medication Dose Route Frequency Provider Last Rate Last Dose  . acetaminophen (TYLENOL) tablet 650 mg  650 mg Oral Q6H PRN Lysle Pearl, Isami, DO       Or  . acetaminophen (TYLENOL) suppository 650 mg  650 mg Rectal Q6H PRN Sakai, Isami, DO      . bisacodyl (DULCOLAX) EC tablet 5 mg  5 mg Oral Daily PRN Sakai, Isami, DO      . Chlorhexidine Gluconate Cloth 2 % PADS 6 each  6 each Topical Q0600 Sakai, Isami, DO   6 each at 05/16/19 0617  . docusate sodium (COLACE) capsule 100 mg  100 mg Oral BID Sakai, Isami, DO      . enoxaparin (LOVENOX) injection 40 mg  40 mg Subcutaneous Q24H Sakai, Isami, DO   40 mg at 05/16/19 1038  . HYDROcodone-acetaminophen (NORCO/VICODIN) 5-325 MG per tablet 1-2 tablet  1-2 tablet Oral Q4H PRN Sakai, Isami, DO      . latanoprost (XALATAN) 0.005 % ophthalmic solution 1 drop  1 drop Both Eyes QHS Sakai, Isami, DO   1 drop at 05/15/19 2239  . MEDLINE mouth rinse  15 mL Mouth Rinse BID Benjamine Sprague,  DO   15 mL at 05/16/19 1045  . morphine 2 MG/ML injection 1 mg  1 mg Intravenous Q3H PRN Lysle Pearl, Isami, DO      . ondansetron (ZOFRAN) tablet 4 mg  4 mg Oral Q6H PRN Lysle Pearl, Isami, DO       Or  . ondansetron (ZOFRAN) injection 4 mg  4 mg Intravenous Q6H PRN Sakai, Isami, DO   4 mg at 05/11/19 1734  . potassium PHOSPHATE 10 mmol in dextrose 5 % 250 mL infusion  10 mmol Intravenous Once Tawnya Crook, RPH      . traZODone (DESYREL) tablet 25 mg  25 mg Oral QHS PRN Benjamine Sprague, DO         Discharge Medications: Please see discharge summary for a list of discharge medications.  Relevant Imaging Results:  Relevant Lab Results:   Additional Information SS# [id number]  Shelbie Hutching, RN

## 2019-05-17 LAB — BASIC METABOLIC PANEL
Anion gap: 11 (ref 5–15)
BUN: 35 mg/dL — ABNORMAL HIGH (ref 8–23)
CO2: 26 mmol/L (ref 22–32)
Calcium: 8.4 mg/dL — ABNORMAL LOW (ref 8.9–10.3)
Chloride: 111 mmol/L (ref 98–111)
Creatinine, Ser: 0.79 mg/dL (ref 0.61–1.24)
GFR calc Af Amer: >60 mL/min (ref >60)
GFR calc non Af Amer: >60 mL/min (ref >60)
Glucose, Bld: 136 mg/dL — ABNORMAL HIGH (ref 70–99)
Potassium: 3.6 mmol/L (ref 3.5–5.1)
Sodium: 148 mmol/L — ABNORMAL HIGH (ref 135–145)

## 2019-05-17 LAB — MAGNESIUM: Magnesium: 2.1 mg/dL (ref 1.7–2.4)

## 2019-05-17 LAB — PHOSPHORUS: Phosphorus: 3.3 mg/dL (ref 2.5–4.6)

## 2019-05-17 LAB — GLUCOSE, CAPILLARY: Glucose-Capillary: 131 mg/dL — ABNORMAL HIGH (ref 70–99)

## 2019-05-17 MED ORDER — FREE WATER
30.0000 mL | Status: DC
  Administered 2019-05-17 – 2019-05-19 (×12): 30 mL

## 2019-05-17 MED ORDER — PIPERACILLIN-TAZOBACTAM 3.375 G IVPB
3.3750 g | Freq: Three times a day (TID) | INTRAVENOUS | Status: DC
  Administered 2019-05-17 – 2019-05-19 (×6): 3.375 g via INTRAVENOUS
  Filled 2019-05-17 (×6): qty 50

## 2019-05-17 MED ORDER — DOCUSATE SODIUM 50 MG/5ML PO LIQD
100.0000 mg | Freq: Two times a day (BID) | ORAL | Status: DC
  Administered 2019-05-17 – 2019-05-22 (×9): 100 mg
  Filled 2019-05-17 (×12): qty 10

## 2019-05-17 MED ORDER — SODIUM CHLORIDE 0.9 % IV SOLN
INTRAVENOUS | Status: DC
  Administered 2019-05-17: 16:00:00 via INTRAVENOUS

## 2019-05-17 MED ORDER — OSMOLITE 1.5 CAL PO LIQD
1000.0000 mL | ORAL | Status: DC
  Administered 2019-05-17: 11:00:00 1000 mL

## 2019-05-17 MED ORDER — PRO-STAT SUGAR FREE PO LIQD
30.0000 mL | Freq: Every day | ORAL | Status: DC
  Administered 2019-05-17 – 2019-05-23 (×7): 30 mL

## 2019-05-17 MED ORDER — ADULT MULTIVITAMIN LIQUID CH
15.0000 mL | Freq: Every day | ORAL | Status: DC
  Administered 2019-05-17 – 2019-05-23 (×7): 15 mL
  Filled 2019-05-17 (×7): qty 15

## 2019-05-17 NOTE — Progress Notes (Signed)
PHARMACY CONSULT NOTE - FOLLOW UP  Pharmacy Consult for Electrolyte Monitoring and Replacement   Recent Labs: Potassium (mmol/L)  Date Value  05/17/2019 3.6  10/18/2011 4.0   Magnesium (mg/dL)  Date Value  05/17/2019 2.1   Calcium (mg/dL)  Date Value  05/17/2019 8.4 (L)   Calcium, Total (mg/dL)  Date Value  10/18/2011 9.1   Albumin (g/dL)  Date Value  05/11/2019 3.6   Phosphorus (mg/dL)  Date Value  05/17/2019 3.3   Sodium (mmol/L)  Date Value  05/17/2019 148 (H)  10/18/2011 138     Assessment: 85 year old male to restart tube feeds today. Pharmacy contacted by dietitian as patient is at risk of refeeding syndrome. MD consulted pharmacy to monitor electrolytes.  S/p PEG placement 7/29. Patient with one IV access. He is not on any fluids at this time. Tube feeds are still on hold.  Goal of Therapy:  Electrolytes WNL  Plan:  No electrolyte replacement needed at this time.   Electrolytes have been ordered with morning labs.  Pharmacy to follow and replace electrolytes as indicated.  Oswald Hillock ,PharmD, BCPS Clinical Pharmacist 05/17/2019 7:45 AM

## 2019-05-17 NOTE — Progress Notes (Signed)
Subjective:  CC: Brian Ferguson is a 85 y.o. male  Hospital stay day 10, malnutrition POD #3 open jejunostomy tube placement  HPI: No acute issues overnight.  Increased O2 requirements. States he passed some small flatus.     ROS:  General: Denies weight loss, weight gain, fatigue, fevers, chills, and night sweats. Heart: Denies chest pain, palpitations, racing heart, irregular heartbeat, leg pain or swelling, and decreased activity tolerance. Respiratory: Denies breathing difficulty, shortness of breath, wheezing, cough, and sputum. GI: Denies change in appetite, heartburn, nausea, vomiting, constipation, diarrhea, and blood in stool. GU: Denies difficulty urinating, pain with urinating, urgency, frequency, blood in urine.   Objective:   Temp:  [97.4 F (36.3 C)-98.5 F (36.9 C)] 97.4 F (36.3 C) (08/01 0356) Pulse Rate:  [94-110] 94 (08/01 0401) Resp:  [16-20] 16 (08/01 0356) BP: (118-151)/(82-103) 137/84 (08/01 0401) SpO2:  [86 %-94 %] 94 % (08/01 0401) Weight:  [55.3 kg] 55.3 kg (08/01 1105)     Height: 5\' 10"  (177.8 cm) Weight: 55.3 kg BMI (Calculated): 17.49   Intake/Output this shift:   Intake/Output Summary (Last 24 hours) at 05/17/2019 1350 Last data filed at 05/17/2019 1114 Gross per 24 hour  Intake 186 ml  Output 1600 ml  Net -1414 ml    Constitutional :  alert, cooperative, appears stated age, cachectic and no distress  Respiratory:  clear to auscultation bilaterally  Cardiovascular:  regular rate and rhythm  Gastrointestinal: soft, slightly decreased distention, continued decreased tenderness throughout.  jtube site clean, no output in gravity bag.   Skin: Cool and moist.   Psychiatric: Normal affect, non-agitated, not confused       LABS:  CMP Latest Ref Rng & Units 05/17/2019 05/16/2019 05/15/2019  Glucose 70 - 99 mg/dL 136(H) 132(H) 138(H)  BUN 8 - 23 mg/dL 35(H) 24(H) 27(H)  Creatinine 0.61 - 1.24 mg/dL 0.79 0.69 0.92  Sodium 135 - 145 mmol/L 148(H) 144  146(H)  Potassium 3.5 - 5.1 mmol/L 3.6 3.3(L) 3.8  Chloride 98 - 111 mmol/L 111 111 112(H)  CO2 22 - 32 mmol/L 26 29 26   Calcium 8.9 - 10.3 mg/dL 8.4(L) 8.1(L) 7.9(L)  Total Protein 6.5 - 8.1 g/dL - - -  Total Bilirubin 0.3 - 1.2 mg/dL - - -  Alkaline Phos 38 - 126 U/L - - -  AST 15 - 41 U/L - - -  ALT 0 - 44 U/L - - -   CBC Latest Ref Rng & Units 05/16/2019 05/15/2019 04/25/2019  WBC 4.0 - 10.5 K/uL 9.1 13.0(H) 10.6(H)  Hemoglobin 13.0 - 17.0 g/dL 12.0(L) 12.4(L) 13.0  Hematocrit 39.0 - 52.0 % 38.9(L) 40.5 42.7  Platelets 150 - 400 K/uL 152 158 161    RADS: n/a Assessment:   Cachexia, malnourishment secondary to dysphagia. Incidental lung lesion. Discussed case with oncology and they recommend maximizing nutritional status first in case lung lesion requires additional treatment.  S/p open Jtube placement. Postop ileus, possible resolving. Better exam today.  Start trickle feeds.  Pending speech eval to see if any change in swallowing, since patient still continues to ask about oral intake.  Will continue to monitor  PEG tube should be at 4cm mark on the skin.  The balloon should only be filled up to 40ml of fluid in order to prevent an obstruction.

## 2019-05-17 NOTE — Progress Notes (Signed)
Hematology/Oncology Progress Note [facility] [phone] [phone]  Patient Care Team: Olin Hauser, DO as PCP - General (Family Medicine)   Name of the patient: Brian Ferguson  [id number]  08-10-1929  Date of visit: 05/17/19   INTERVAL HISTORY-  Patient is lying in bed.  Patient was placed on 100% nonrebreather last night, switched to nasal cannula oxygen around noon. Tube feeds were started today.   Review of systems- Review of Systems  Unable to perform ROS: Other (Not cooperative)  HENT:         Dry mouth    No Known Allergies  Patient Active Problem List   Diagnosis Date Noted  . Protein-calorie malnutrition, severe 05/02/2019  . Palliative care encounter   . Dysphagia   . Lung mass   . Cachexia ([place]) 04/27/2019  . History of gastric cancer 03/18/2019  . Malnutrition ([place]) 03/18/2019  . Recurrent falls 03/18/2019  . Generalized weakness 03/18/2019     Past Medical History:  Diagnosis Date  . Cancer ([place]) 1987   stomach. 2/3 of stomach removed  . Confusion    alert and oriented at times  . Coronary artery disease   . Glaucoma   . History of blood clots   . Malabsorption syndrome      Past Surgical History:  Procedure Laterality Date  . GASTROSTOMY N/A 05/15/2019   Procedure: INSERTION OF GASTROSTOMY TUBE;  Surgeon: Benjamine Sprague, DO;  Location: [facility] ORS;  Service: General;  Laterality: N/A;  . HERNIA REPAIR    . PEG PLACEMENT N/A 05/08/2019   Procedure: PERCUTANEOUS ENDOSCOPIC GASTROSTOMY (PEG) PLACEMENT;  Surgeon: Toledo, Benay Pike, MD;  Location: [facility] ENDOSCOPY;  Service: Gastroenterology;  Laterality: N/A;  . Zeba   removed 2/3 of stomach d/t cancer    Social History   Socioeconomic History  . Marital status: Widowed    Spouse name: Not on file  . Number of children: Not on file  . Years of education: Not on file  . Highest education level: Not on file  Occupational  History  . Not on file  Social Needs  . Financial resource strain: Not on file  . Food insecurity    Worry: Not on file    Inability: Not on file  . Transportation needs    Medical: Not on file    Non-medical: Not on file  Tobacco Use  . Smoking status: Former Smoker    Types: Cigarettes  . Smokeless tobacco: Former Network engineer and Sexual Activity  . Alcohol use: Not Currently  . Drug use: No  . Sexual activity: Not on file  Lifestyle  . Physical activity    Days per week: Not on file    Minutes per session: Not on file  . Stress: Not on file  Relationships  . Social Herbalist on phone: Not on file    Gets together: Not on file    Attends religious service: Not on file    Active member of club or organization: Not on file    Attends meetings of clubs or organizations: Not on file    Relationship status: Not on file  . Intimate partner violence    Fear of current or ex partner: Not on file    Emotionally abused: Not on file    Physically abused: Not on file    Forced sexual activity: Not on file  Other Topics Concern  . Not on file  Social History Narrative   Lives at home with son.     History reviewed. No pertinent family history.   Current Facility-Administered Medications:  .  0.9 %  sodium chloride infusion, , Intravenous, Continuous, Wieting, Richard, MD, Last Rate: 30 mL/hr at 05/17/19 1619 .  acetaminophen (TYLENOL) tablet 650 mg, 650 mg, Oral, Q6H PRN **OR** acetaminophen (TYLENOL) suppository 650 mg, 650 mg, Rectal, Q6H PRN, Sakai, Isami, DO .  bisacodyl (DULCOLAX) EC tablet 5 mg, 5 mg, Oral, Daily PRN, Sakai, Isami, DO .  Chlorhexidine Gluconate Cloth 2 % PADS 6 each, 6 each, Topical, Q0600, Sakai, Isami, DO, 6 each at 05/17/19 [phone] .  docusate (COLACE) 50 MG/5ML liquid 100 mg, 100 mg, Per Tube, BID, Epifanio Lesches, MD, 100 mg at 05/17/19 1622 .  enoxaparin (LOVENOX) injection 40 mg, 40 mg, Subcutaneous, Q24H, Sakai, Isami, DO, 40 mg  at 05/17/19 1052 .  feeding supplement (OSMOLITE 1.5 CAL) liquid 1,000 mL, 1,000 mL, Per Tube, Continuous, Sakai, Isami, DO, Last Rate: 15 mL/hr at 05/17/19 1107, 1,000 mL at 05/17/19 1107 .  feeding supplement (PRO-STAT SUGAR FREE 64) liquid 30 mL, 30 mL, Per Tube, Daily, Sakai, Isami, DO, 30 mL at 05/17/19 1108 .  free water 30 mL, 30 mL, Per Tube, Q4H, Sakai, Isami, DO, 30 mL at 05/17/19 1600 .  HYDROcodone-acetaminophen (NORCO/VICODIN) 5-325 MG per tablet 1-2 tablet, 1-2 tablet, Oral, Q4H PRN, Sakai, Isami, DO .  latanoprost (XALATAN) 0.005 % ophthalmic solution 1 drop, 1 drop, Both Eyes, QHS, Sakai, Isami, DO, 1 drop at 05/16/19 2151 .  MEDLINE mouth rinse, 15 mL, Mouth Rinse, BID, Sakai, Isami, DO, 15 mL at 05/17/19 1053 .  multivitamin liquid 15 mL, 15 mL, Per Tube, Daily, Sakai, Isami, DO, 15 mL at 05/17/19 1053 .  ondansetron (ZOFRAN) tablet 4 mg, 4 mg, Oral, Q6H PRN **OR** ondansetron (ZOFRAN) injection 4 mg, 4 mg, Intravenous, Q6H PRN, Sakai, Isami, DO, 4 mg at 05/11/19 1734 .  piperacillin-tazobactam (ZOSYN) IVPB 3.375 g, 3.375 g, Intravenous, Q8H, Nazari, Walid A, RPH, Last Rate: 12.5 mL/hr at 05/17/19 1621, 3.375 g at 05/17/19 1621 .  traZODone (DESYREL) tablet 25 mg, 25 mg, Oral, QHS PRN, Benjamine Sprague, DO   Physical exam:  Vitals:   05/17/19 0356 05/17/19 0401 05/17/19 1105 05/17/19 1534  BP: (!) 151/103 137/84  125/80  Pulse: 95 94  69  Resp: 16   18  Temp: (!) 97.4 F (36.3 C)   (!) 97.5 F (36.4 C)  TempSrc:    Oral  SpO2: (!) 88% 94%  (!) 88%  Weight:   121 lb 14.6 oz (55.3 kg)   Height:       Physical Exam  Constitutional: No distress.  HENT:  Head: Normocephalic and atraumatic.  Eyes: Pupils are equal, round, and reactive to light.  Neck: Neck supple.  Cardiovascular: Normal rate and regular rhythm.  Pulmonary/Chest: Effort normal. He has no wheezes.  On nonrebreather, switch to nasal cannula oxygen around noon.  Abdominal: Soft.  J-tube  Neurological: He is  alert.  Skin: Skin is warm and dry.       CMP Latest Ref Rng & Units 05/17/2019  Glucose 70 - 99 mg/dL 136(H)  BUN 8 - 23 mg/dL 35(H)  Creatinine 0.61 - 1.24 mg/dL 0.79  Sodium 135 - 145 mmol/L 148(H)  Potassium 3.5 - 5.1 mmol/L 3.6  Chloride 98 - 111 mmol/L 111  CO2 22 - 32 mmol/L 26  Calcium 8.9 - 10.3 mg/dL 8.4(L)  Total  Protein 6.5 - 8.1 g/dL -  Total Bilirubin 0.3 - 1.2 mg/dL -  Alkaline Phos 38 - 126 U/L -  AST 15 - 41 U/L -  ALT 0 - 44 U/L -   CBC Latest Ref Rng & Units 05/16/2019  WBC 4.0 - 10.5 K/uL 9.1  Hemoglobin 13.0 - 17.0 g/dL 12.0(L)  Hematocrit 39.0 - 52.0 % 38.9(L)  Platelets 150 - 400 K/uL 152    RADIOGRAPHIC STUDIES: I have personally reviewed the radiological images as listed and agreed with the findings in the report.  Ct Head Wo Contrast  Result Date: 05/15/2019 CLINICAL DATA:  Dysphagia.  Lung nodule. EXAM: CT HEAD WITHOUT CONTRAST TECHNIQUE: Contiguous axial images were obtained from the base of the skull through the vertex without intravenous contrast. COMPARISON:  None. FINDINGS: Brain: Moderate atrophy. 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 Chronic ischemic change in the white matter bilaterally. Small chronic infarct left cerebellum. Negative for acute infarct. Negative for hemorrhage mass or edema. No midline shift. Vascular: Negative for hyperdense vessel. Atherosclerotic calcification in the cavernous carotid bilaterally. Skull: Negative Sinuses/Orbits: Paranasal sinuses clear.  Bilateral cataract surgery Other: None IMPRESSION: Atrophy and chronic ischemic change.  No acute abnormality. Electronically Signed   By: Franchot Gallo M.D.   On: 05/15/2019 18:40   Ct Chest W Contrast  Result Date: 05/08/2019 CLINICAL DATA:  Remote history of gastric cancer. Patient is having progressive weight loss. Symptoms of dysphagia. EXAM: CT CHEST, ABDOMEN, AND PELVIS WITH CONTRAST TECHNIQUE: Multidetector CT imaging of the chest, abdomen and pelvis was  performed following the standard protocol during bolus administration of intravenous contrast. CONTRAST:  81m OMNIPAQUE IOHEXOL 300 MG/ML  SOLN COMPARISON:  None. FINDINGS: CT CHEST FINDINGS Cardiovascular: The heart is normal in size. There is age-related tortuosity, ectasia and calcification of the thoracic aorta but no dissection or focal aneurysm. The branch vessels are patent. Moderate three-vessel coronary artery calcifications are noted. Mediastinum/Nodes: No mediastinal or hilar mass or lymphadenopathy. The esophagus is grossly normal. Lungs/Pleura: There is a 2.5 x 1.4 x 1.3 cm lesion in the right upper lobe suspicious for neoplasm but possibly scar tissue. Without prior studies for comparison PET-CT may be helpful for further evaluation. Patchy areas of tree-in-bud opacity in the left upper lobe and left lower lobe suggesting chronic inflammation or possibly atypical infection such as MAC. Endobronchial debris in the right lower lobe along with peribronchial thickening may suggest aspiration. No right lower lobe pneumonia. Patchy atelectasis or scarring change noted in the superior segment of the left lower lobe. Musculoskeletal: Fairly marked cachexia. No chest wall mass or axillary adenopathy. The bony structures are intact.  No bone lesions are identified. CT ABDOMEN PELVIS FINDINGS Hepatobiliary: Numerous hepatic cysts. No worrisome hepatic lesions or intrahepatic biliary dilatation. Pancreas: No mass, inflammation or ductal dilatation. Moderate pancreatic atrophy. Spleen: Normal size.  No worrisome lesions. Adrenals/Urinary Tract: The adrenal glands and kidneys are unremarkable. A right renal cyst is noted with some thin peripheral calcifications. No hydronephrosis. The bladder is grossly normal. Stomach/Bowel: Surgical changes noted involving the stomach but I do not see any findings suspicious for recurrent mass. The small bowel and colon are grossly normal. Vascular/Lymphatic: Marked tortuosity  and mild ectasia of the abdominal aorta with moderate atherosclerotic calcifications but no focal aneurysm or dissection. The branch vessels are patent. There is marked tortuosity, ectasia and calcification of the iliac arteries. 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 No obvious mesenteric or  retroperitoneal mass or adenopathy. Reproductive: The prostate gland and seminal vesicles are grossly normal. Other: No pelvic mass or free pelvic fluid collections. Musculoskeletal: Scoliosis and advanced degenerative lumbar spondylosis but no worrisome bone lesions. IMPRESSION: 1. Worrisome right upper lobe pulmonary lesion measuring 2.5 x 1.4 x 1.3 cm. PET-CT may be helpful for further evaluation. No mediastinal or hilar lymphadenopathy or evidence of metastatic disease elsewhere. 2. Patchy areas of tree-in-bud appearance suggesting chronic inflammation or atypical infection such as MAC. 3. Moderate amount of endobronchial debris in the right lower lobe along with peribronchial thickening possibly due to aspiration but no definite findings for pneumonia. 4. Numerous hepatic cysts but no worrisome hepatic lesions. There is also a complex upper pole right renal cyst with areas of peripheral calcification but no solid enhancing components. 5. Advanced vascular disease. 6. Surgical changes from gastric surgery without complicating features. I do not see any definite findings for recurrent gastric mass. Electronically Signed   By: Marijo Sanes M.D.   On: 05/08/2019 08:51   Ct Abdomen Pelvis W Contrast  Result Date: 05/08/2019 CLINICAL DATA:  Remote history of gastric cancer. Patient is having progressive weight loss. Symptoms of dysphagia. EXAM: CT CHEST, ABDOMEN, AND PELVIS WITH CONTRAST TECHNIQUE: Multidetector CT imaging of the chest, abdomen and pelvis was performed following the standard protocol during bolus administration of intravenous contrast. CONTRAST:  74m OMNIPAQUE  IOHEXOL 300 MG/ML  SOLN COMPARISON:  None. FINDINGS: CT CHEST FINDINGS Cardiovascular: The heart is normal in size. There is age-related tortuosity, ectasia and calcification of the thoracic aorta but no dissection or focal aneurysm. The branch vessels are patent. Moderate three-vessel coronary artery calcifications are noted. Mediastinum/Nodes: No mediastinal or hilar mass or lymphadenopathy. The esophagus is grossly normal. Lungs/Pleura: There is a 2.5 x 1.4 x 1.3 cm lesion in the right upper lobe suspicious for neoplasm but possibly scar tissue. Without prior studies for comparison PET-CT may be helpful for further evaluation. Patchy areas of tree-in-bud opacity in the left upper lobe and left lower lobe suggesting chronic inflammation or possibly atypical infection such as MAC. Endobronchial debris in the right lower lobe along with peribronchial thickening may suggest aspiration. No right lower lobe pneumonia. Patchy atelectasis or scarring change noted in the superior segment of the left lower lobe. Musculoskeletal: Fairly marked cachexia. No chest wall mass or axillary adenopathy. The bony structures are intact.  No bone lesions are identified. CT ABDOMEN PELVIS FINDINGS Hepatobiliary: Numerous hepatic cysts. No worrisome hepatic lesions or intrahepatic biliary dilatation. Pancreas: No mass, inflammation or ductal dilatation. Moderate pancreatic atrophy. Spleen: Normal size.  No worrisome lesions. Adrenals/Urinary Tract: The adrenal glands and kidneys are unremarkable. A right renal cyst is noted with some thin peripheral calcifications. No hydronephrosis. The bladder is grossly normal. Stomach/Bowel: Surgical changes noted involving the stomach but I do not see any findings suspicious for recurrent mass. The small bowel and colon are grossly normal. Vascular/Lymphatic: Marked tortuosity and mild ectasia of the abdominal aorta with moderate atherosclerotic calcifications but no focal aneurysm or dissection.  The branch vessels are patent. There is marked tortuosity, ectasia and calcification of the iliac arteries. 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 No obvious mesenteric or retroperitoneal mass or adenopathy. Reproductive: The prostate gland and seminal vesicles are grossly normal. Other: No pelvic mass or free pelvic fluid collections. Musculoskeletal: Scoliosis and advanced degenerative lumbar spondylosis but no worrisome bone lesions. IMPRESSION: 1. Worrisome right upper lobe pulmonary lesion measuring 2.5 x 1.4  x 1.3 cm. PET-CT may be helpful for further evaluation. No mediastinal or hilar lymphadenopathy or evidence of metastatic disease elsewhere. 2. Patchy areas of tree-in-bud appearance suggesting chronic inflammation or atypical infection such as MAC. 3. Moderate amount of endobronchial debris in the right lower lobe along with peribronchial thickening possibly due to aspiration but no definite findings for pneumonia. 4. Numerous hepatic cysts but no worrisome hepatic lesions. There is also a complex upper pole right renal cyst with areas of peripheral calcification but no solid enhancing components. 5. Advanced vascular disease. 6. Surgical changes from gastric surgery without complicating features. I do not see any definite findings for recurrent gastric mass. Electronically Signed   By: Marijo Sanes M.D.   On: 05/08/2019 08:51   Dg Carlena Hurl Op [employer] Speech Path  Result Date: 05/01/2019 CLINICAL DATA:  Weight loss.  Dysphagia. EXAM: MODIFIED BARIUM SWALLOW TECHNIQUE: Different consistencies of barium were administered orally to the patient by the Speech Pathologist. Imaging of the pharynx was performed in the lateral projection. The radiologist was present in the fluoroscopy room for this study, providing personal supervision. FLUOROSCOPY TIME:  Fluoroscopy Time:  1 minutes Number of Acquired Spot Images: 0 COMPARISON:  None. FINDINGS: There was  aspiration during the study. Please see the speech pathologist's report for full details. IMPRESSION: There was aspiration during the study. Please see the speech pathologist's report for full details. Please refer to the Speech Pathologists report for complete details and recommendations. Electronically Signed   By: Dorise Bullion III M.D   On: 05/01/2019 14:08   Dg Chest Port 1 View  Result Date: 05/16/2019 CLINICAL DATA:  Shortness of Breath EXAM: PORTABLE CHEST 1 VIEW COMPARISON:  05/16/2019 FINDINGS: Heart is normal size. Vague airspace opacity at the left lung base is again noted concerning for infiltrate/pneumonia. He views gaseous distention of the colon underneath both left and right hemidiaphragms. No effusions. No acute bony abnormality. IMPRESSION: Vague airspace opacity in the left lower lung concerning for infiltrate/pneumonia. Electronically Signed   By: Rolm Baptise M.D.   On: 05/16/2019 23:12   Dg Chest Port 1 View  Addendum Date: 05/16/2019   ADDENDUM REPORT: 05/16/2019 16:48 ADDENDUM: Study discussed by telephone with Dr. Demetrios Loll on 05/16/2019 at 1645 hours, who advises that this patient had a jejunostomy tube surgically placed 2 days ago. Therefore, some pneumoperitoneum could be an expected finding. We discussed that left-side-down decubitus view should confirm or refute, and might also determine the extent of, free air. Electronically Signed   By: Genevie Ann M.D.   On: 05/16/2019 16:48   Result Date: 05/16/2019 CLINICAL DATA:  85 year old male with history of gastric cancer, suspected aspiration. EXAM: PORTABLE CHEST 1 VIEW COMPARISON:  CT Chest, Abdomen, and Pelvis 05/08/2019 FINDINGS: Portable AP upright view at 1612 hours. Increased gas distended bowel in the right abdomen, but the appearance of lucency under the right hemidiaphragm is such that pneumoperitoneum cannot be excluded. Lower lung volumes. Veiling opacity at the left lung base. Stable cardiac size and mediastinal  contours. No pneumothorax or pulmonary edema. No acute osseous abnormality identified. Increased gas-filled bowel loops in the bilateral upper quadrants. Multiple bilateral upper quadrant surgical clips and staple lines. IMPRESSION: 1. Possible pneumoperitoneum, although lucency under the right hemidiaphragm may instead be related to increased gaseous distension of bowel throughout the upper abdomen. Left-side-down lateral decubitus view may be confirmatory. 2. New veiling opacity at the left lung base more resembles pleural effusion, but aspiration or pneumonia are difficult  to exclude. Electronically Signed: By: Genevie Ann M.D. On: 05/16/2019 16:39   Dg Addison Bailey G Tube Plc W/fl W/rad  Result Date: 05/12/2019 CLINICAL DATA:  Malnutrition, tube placement EXAM: NASO G TUBE PLACEMENT WITH FL AND WITH RAD CONTRAST:  10 mL Isovue-300 FLUOROSCOPY TIME:  Fluoroscopy Time:  00:30 Number of Acquired Spot Images: 1 COMPARISON:  None. FINDINGS: Using lidocaine jelly for local anesthesia, a weighted enteric feeding tube was advanced through the right nostril to the partially resected stomach under fluoroscopic guidance without complication. A small volume of water soluble contrast was injected to confirm intragastric tube location and the tube was secured. IMPRESSION: Successful fluoroscopic placement of weighted enteric feeding tube within the partially resected stomach. Electronically Signed   By: Eddie Candle M.D.   On: 05/12/2019 09:18   Dg Addison Bailey G Tube Plc W/fl W/rad  Result Date: 04/16/2019 CLINICAL DATA:  Feeding tube needed. EXAM: NASO G TUBE PLACEMENT WITH FL AND WITH RAD CONTRAST:  None. FLUOROSCOPY TIME:  Fluoroscopy Time:  4 minutes 42 seconds Radiation Exposure Index (if provided by the fluoroscopic device): 43.0 mGy COMPARISON:  CT 05/02/2019. FINDINGS: A feeding tube was placed through the left nares down the esophagus into the stomach under fluoroscopic guidance. There were no complications. IMPRESSION:  Successful feeding tube placement. Electronically Signed   By: Marcello Moores  Register   On: 05/10/2019 14:56   Dg Foot Complete Right  Result Date: 05/02/2019 Please see detailed radiograph report in office note.   Assessment and plan-  Patient is a 85 y.o. male with a history of gastric cancer in 1987, status post partial gastrectomy, History of blood clots, glaucoma, malabsorption syndrome presented for evaluation of progressive weight loss.  Was found to have severe dysphagia.  Image work-up showed lung mass.  #Cachectic, malnutrition secondary to dysphagia. Status post J-tube placement.  Starting feeding per surgery.  Watch for refeeding syndrome.  #Acute hypoxic respiratory failure, likely secondary to aspiration pneumonia.  Started on Zosyn.  #Right upper lobe lung mass Need further work-up when he is condition stabilized.  #History of gastric cancer, remote history in 34.  Pathology is not available to me at this point. No definitive radiographic evidence of local or distant metastasis at this point. CODE STATUS DNR/DNI.  Thank you for allowing me to participate in the care of this patient.   Earlie Server, MD, PhD Hematology Oncology [facility] Pager- [phone] 05/17/2019

## 2019-05-17 NOTE — Consult Note (Signed)
Pharmacy Antibiotic Note  Brian Ferguson is a 85 y.o. male admitted on 05/03/2019 with aspiration pneumonia.  Pharmacy has been consulted for Zosyn dosing.  Plan: Zosyn 3.375g IV q8h (4 hour infusion).  Height: 5\' 10"  (177.8 cm) Weight: 121 lb 14.6 oz (55.3 kg) IBW/kg (Calculated) : 73  Temp (24hrs), Avg:97.8 F (36.6 C), Min:97.4 F (36.3 C), Max:98.5 F (36.9 C)  Recent Labs  Lab 05/13/19 0415 04/19/2019 0357 05/08/2019 2103 05/15/19 0531 05/16/19 0502 05/17/19 0557  WBC 12.5*  --  10.6* 13.0* 9.1  --   CREATININE 0.61 0.53* 0.79 0.92 0.69 0.79    Estimated Creatinine Clearance: 49.9 mL/min (by C-G formula based on SCr of 0.79 mg/dL).    No Known Allergies  Antimicrobials this admission: Zosyn 8/1 >>    Thank you for allowing pharmacy to be a part of this patient's care.  Pearla Dubonnet 05/17/2019 3:56 PM

## 2019-05-17 NOTE — Progress Notes (Addendum)
Patient ID: Brian Ferguson, male   DOB: 1929-07-13, 85 y.o.   MRN: [id number]  [facility] PROGRESS NOTE  Brian Ferguson PIR:[id number] DOB: 1929/05/16 DOA: 04/25/2019 PCP: Olin Hauser, DO  HPI/Subjective: Patient states he wants to go home.  Tube feeds were just started today.  Patient was placed on 100% nonrebreather last night.  Objective: Vitals:   05/17/19 0401 05/17/19 1534  BP: 137/84 125/80  Pulse: 94 69  Resp:  18  Temp:  (!) 97.5 F (36.4 C)  SpO2: 94% (!) 88%    Filed Weights   05/11/2019 0408 04/19/2019 1434 05/17/19 1105  Weight: 53.5 kg 53.5 kg 55.3 kg    ROS: Review of Systems  Unable to perform ROS: Acuity of condition  Respiratory: Negative for shortness of breath.   Gastrointestinal: Positive for abdominal pain.   Exam: Physical Exam  HENT:  Nose: No mucosal edema.  Mouth/Throat: No oropharyngeal exudate or posterior oropharyngeal edema.  Eyes: Pupils are equal, round, and reactive to light. Conjunctivae, EOM and lids are normal.  Neck: No JVD present. Carotid bruit is not present. No edema present. No thyroid mass and no thyromegaly present.  Cardiovascular: S1 normal and S2 normal. Exam reveals no gallop.  No murmur heard. Respiratory: No respiratory distress. He has decreased breath sounds in the right lower field and the left lower field. He has no wheezes. He has no rhonchi. He has no rales.  GI: Soft. Bowel sounds are normal. There is no abdominal tenderness.  Musculoskeletal:     Right ankle: He exhibits swelling.     Left ankle: He exhibits swelling.  Lymphadenopathy:    He has no cervical adenopathy.  Neurological: He is alert. No cranial nerve deficit.  Skin: Skin is warm. No rash noted. Nails show no clubbing.  Psychiatric: He has a normal mood and affect.      Data Reviewed: Basic Metabolic Panel: Recent Labs  Lab 05/13/19 0415 05/13/19 1922 05/13/2019 0357 04/19/2019 2103 05/15/19 0531 05/16/19 0502 05/17/19 0557  NA  143  --  145  --  146* 144 148*  K 2.7* 3.4* 3.2*  --  3.8 3.3* 3.6  CL 110  --  112*  --  112* 111 111  CO2 26  --  27  --  26 29 26   GLUCOSE 198*  --  125*  --  138* 132* 136*  BUN 30*  --  28*  --  27* 24* 35*  CREATININE 0.61  --  0.53* 0.79 0.92 0.69 0.79  CALCIUM 8.3*  --  8.2*  --  7.9* 8.1* 8.4*  MG 2.1  --  2.0  --  1.9 2.1 2.1  PHOS 1.7*  --  1.3*  --  3.1 1.9* 3.3   CBC: Recent Labs  Lab 05/13/19 0415 05/05/2019 2103 05/15/19 0531 05/16/19 0502  WBC 12.5* 10.6* 13.0* 9.1  HGB 11.5* 13.0 12.4* 12.0*  HCT 37.7* 42.7 40.5 38.9*  MCV 96.4 97.9 97.4 98.5  PLT 145* 161 158 152    CBG: Recent Labs  Lab 05/13/19 0802 04/20/2019 0735 05/15/19 0732 05/16/19 0740 05/17/19 1149  GLUCAP 128* 129* 115* 118* 131*     Studies: Ct Head Wo Contrast  Result Date: 05/15/2019 CLINICAL DATA:  Dysphagia.  Lung nodule. EXAM: CT HEAD WITHOUT CONTRAST TECHNIQUE: Contiguous axial images were obtained from the base of the skull through the vertex without intravenous contrast. COMPARISON:  None. FINDINGS: Brain: Moderate atrophy. 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  Chronic ischemic change in the white matter bilaterally. Small chronic infarct left cerebellum. Negative for acute infarct. Negative for hemorrhage mass or edema. No midline shift. Vascular: Negative for hyperdense vessel. Atherosclerotic calcification in the cavernous carotid bilaterally. Skull: Negative Sinuses/Orbits: Paranasal sinuses clear.  Bilateral cataract surgery Other: None IMPRESSION: Atrophy and chronic ischemic change.  No acute abnormality. Electronically Signed   By: Franchot Gallo M.D.   On: 05/15/2019 18:40   Dg Chest Port 1 View  Result Date: 05/16/2019 CLINICAL DATA:  Shortness of Breath EXAM: PORTABLE CHEST 1 VIEW COMPARISON:  05/16/2019 FINDINGS: Heart is normal size. Vague airspace opacity at the left lung base is again noted concerning for infiltrate/pneumonia. He views gaseous distention of  the colon underneath both left and right hemidiaphragms. No effusions. No acute bony abnormality. IMPRESSION: Vague airspace opacity in the left lower lung concerning for infiltrate/pneumonia. Electronically Signed   By: Rolm Baptise M.D.   On: 05/16/2019 23:12   Dg Chest Port 1 View  Addendum Date: 05/16/2019   ADDENDUM REPORT: 05/16/2019 16:48 ADDENDUM: Study discussed by telephone with Dr. Demetrios Loll on 05/16/2019 at 1645 hours, who advises that this patient had a jejunostomy tube surgically placed 2 days ago. Therefore, some pneumoperitoneum could be an expected finding. We discussed that left-side-down decubitus view should confirm or refute, and might also determine the extent of, free air. Electronically Signed   By: Genevie Ann M.D.   On: 05/16/2019 16:48   Result Date: 05/16/2019 CLINICAL DATA:  85 year old male with history of gastric cancer, suspected aspiration. EXAM: PORTABLE CHEST 1 VIEW COMPARISON:  CT Chest, Abdomen, and Pelvis 04/20/2019 FINDINGS: Portable AP upright view at 1612 hours. Increased gas distended bowel in the right abdomen, but the appearance of lucency under the right hemidiaphragm is such that pneumoperitoneum cannot be excluded. Lower lung volumes. Veiling opacity at the left lung base. Stable cardiac size and mediastinal contours. No pneumothorax or pulmonary edema. No acute osseous abnormality identified. Increased gas-filled bowel loops in the bilateral upper quadrants. Multiple bilateral upper quadrant surgical clips and staple lines. IMPRESSION: 1. Possible pneumoperitoneum, although lucency under the right hemidiaphragm may instead be related to increased gaseous distension of bowel throughout the upper abdomen. Left-side-down lateral decubitus view may be confirmatory. 2. New veiling opacity at the left lung base more resembles pleural effusion, but aspiration or pneumonia are difficult to exclude. Electronically Signed: By: Genevie Ann M.D. On: 05/16/2019 16:39    Scheduled  Meds: . Chlorhexidine Gluconate Cloth  6 each Topical Q0600  . docusate  100 mg Per Tube BID  . enoxaparin (LOVENOX) injection  40 mg Subcutaneous Q24H  . feeding supplement (PRO-STAT SUGAR FREE 64)  30 mL Per Tube Daily  . free water  30 mL Per Tube Q4H  . latanoprost  1 drop Both Eyes QHS  . mouth rinse  15 mL Mouth Rinse BID  . multivitamin  15 mL Per Tube Daily   Continuous Infusions: . feeding supplement (OSMOLITE 1.5 CAL) 1,000 mL (05/17/19 1107)    Assessment/Plan:   1. Severe protein calorie malnutrition with cachexia weight loss and dysphasia.  Patient had J-tube placed by surgery.  Starting tube feedings today. 2. Acute hypoxic respiratory failure.  Patient placed on 100% nonrebreather. 3. Right upper lobe pulmonary lesion 4. Hypokalemia and hypophosphatemia.  Supplement with tube feeding. 5. Dehydration hypernatremia improved with IV fluids 6. Overall prognosis is poor.  Patient was changed to a DNR. 7.  Aspiration pneumonia seen on chest x-ray  last night.        Started Zosyn  Code Status:     Code Status Orders  (From admission, onward)         Start     Ordered   05/16/19 0944  Do not attempt resuscitation (DNR)  Continuous    Question Answer Comment  In the event of cardiac or respiratory ARREST Do not call a "code blue"   In the event of cardiac or respiratory ARREST Do not perform Intubation, CPR, defibrillation or ACLS   In the event of cardiac or respiratory ARREST Use medication by any route, position, wound care, and other measures to relive pain and suffering. May use oxygen, suction and manual treatment of airway obstruction as needed for comfort.      05/16/19 0943        Code Status History    Date Active Date Inactive Code Status Order ID Comments User Context   04/19/2019 1337 05/16/2019 0943 Full Code [id number]  Epifanio Lesches, MD Inpatient   Advance Care Planning Activity    Advance Directive Documentation     Most Recent Value  Type  of Advance Directive  Healthcare Power of Attorney, Living will  Pre-existing out of facility DNR order (yellow form or pink MOST form)  -  "MOST" Form in Place?  -     Family Communication: Spoke with daughter [name] on the phone.  Spoke with Diane. Disposition Plan: We will confirm plan with family tomorrow.  Likely will need rehab with hospice follow-up  Time spent: 38 minutes  [place]

## 2019-05-17 DEATH — deceased

## 2019-05-18 LAB — GLUCOSE, CAPILLARY: Glucose-Capillary: 142 mg/dL — ABNORMAL HIGH (ref 70–99)

## 2019-05-18 LAB — BASIC METABOLIC PANEL
Anion gap: 9 (ref 5–15)
BUN: 36 mg/dL — ABNORMAL HIGH (ref 8–23)
CO2: 30 mmol/L (ref 22–32)
Calcium: 8.8 mg/dL — ABNORMAL LOW (ref 8.9–10.3)
Chloride: 113 mmol/L — ABNORMAL HIGH (ref 98–111)
Creatinine, Ser: 0.78 mg/dL (ref 0.61–1.24)
GFR calc Af Amer: >60 mL/min (ref >60)
GFR calc non Af Amer: >60 mL/min (ref >60)
Glucose, Bld: 159 mg/dL — ABNORMAL HIGH (ref 70–99)
Potassium: 3.4 mmol/L — ABNORMAL LOW (ref 3.5–5.1)
Sodium: 152 mmol/L — ABNORMAL HIGH (ref 135–145)

## 2019-05-18 LAB — PHOSPHORUS: Phosphorus: 2.2 mg/dL — ABNORMAL LOW (ref 2.5–4.6)

## 2019-05-18 LAB — MAGNESIUM: Magnesium: 2.1 mg/dL (ref 1.7–2.4)

## 2019-05-18 MED ORDER — OSMOLITE 1.5 CAL PO LIQD
1000.0000 mL | ORAL | Status: DC
  Administered 2019-05-18: 1000 mL

## 2019-05-18 MED ORDER — POTASSIUM PHOSPHATES 15 MMOLE/5ML IV SOLN
10.0000 mmol | Freq: Once | INTRAVENOUS | Status: AC
  Administered 2019-05-18: 10 mmol via INTRAVENOUS
  Filled 2019-05-18: qty 3.33

## 2019-05-18 MED ORDER — POTASSIUM CL IN DEXTROSE 5% 20 MEQ/L IV SOLN
20.0000 meq | INTRAVENOUS | Status: DC
  Administered 2019-05-18 – 2019-05-21 (×3): 20 meq via INTRAVENOUS
  Filled 2019-05-18 (×3): qty 1000

## 2019-05-18 MED ORDER — BISACODYL 5 MG PO TBEC
5.0000 mg | DELAYED_RELEASE_TABLET | Freq: Every day | ORAL | Status: DC
  Administered 2019-05-18 – 2019-05-20 (×3): 5 mg via ORAL
  Filled 2019-05-18 (×4): qty 1

## 2019-05-18 NOTE — Progress Notes (Signed)
Subjective:  CC: Brian Ferguson is a 85 y.o. male  Hospital stay day 11, malnutrition POD #4 open jejunostomy tube placement  HPI: No acute issues overnight.  Decreased O2 requirements now. No report of flatus today by patient, but he is confused about where he is.  ROS:  Unable to obtain due to mentation.  Objective:   Temp:  [97.5 F (36.4 C)-98 F (36.7 C)] 98 F (36.7 C) (08/02 0514) Pulse Rate:  [69-95] 92 (08/02 0514) Resp:  [18] 18 (08/02 0514) BP: (125-137)/(80-96) 137/83 (08/02 0514) SpO2:  [88 %-94 %] 94 % (08/02 0514) Weight:  [54.8 kg] 54.8 kg (08/02 0514)     Height: 5\' 10"  (177.8 cm) Weight: 54.8 kg BMI (Calculated): 17.33   Intake/Output this shift:   Intake/Output Summary (Last 24 hours) at 05/18/2019 1528 Last data filed at 05/18/2019 1440 Gross per 24 hour  Intake 1003.56 ml  Output 1075 ml  Net -71.44 ml    Constitutional :  alert, appears stated age, cachectic and no distress  Respiratory:  clear to auscultation bilaterally  Cardiovascular:  regular rate and rhythm  Gastrointestinal: soft, increased distention today compared to previous exam, no tenderness.  jtube site clean,  tube feeds running.   Skin: Cool and moist.   Psychiatric: Normal affect, non-agitated, not confused       LABS:  CMP Latest Ref Rng & Units 05/18/2019 05/17/2019 05/16/2019  Glucose 70 - 99 mg/dL 159(H) 136(H) 132(H)  BUN 8 - 23 mg/dL 36(H) 35(H) 24(H)  Creatinine 0.61 - 1.24 mg/dL 0.78 0.79 0.69  Sodium 135 - 145 mmol/L 152(H) 148(H) 144  Potassium 3.5 - 5.1 mmol/L 3.4(L) 3.6 3.3(L)  Chloride 98 - 111 mmol/L 113(H) 111 111  CO2 22 - 32 mmol/L 30 26 29   Calcium 8.9 - 10.3 mg/dL 8.8(L) 8.4(L) 8.1(L)  Total Protein 6.5 - 8.1 g/dL - - -  Total Bilirubin 0.3 - 1.2 mg/dL - - -  Alkaline Phos 38 - 126 U/L - - -  AST 15 - 41 U/L - - -  ALT 0 - 44 U/L - - -   CBC Latest Ref Rng & Units 05/16/2019 05/15/2019 05/10/2019  WBC 4.0 - 10.5 K/uL 9.1 13.0(H) 10.6(H)  Hemoglobin 13.0 - 17.0 g/dL  12.0(L) 12.4(L) 13.0  Hematocrit 39.0 - 52.0 % 38.9(L) 40.5 42.7  Platelets 150 - 400 K/uL 152 158 161    RADS: n/a Assessment:   Cachexia, malnourishment secondary to dysphagia. Incidental lung lesion. Discussed case with oncology and they recommend maximizing nutritional status first in case lung lesion requires additional treatment.  S/p open Jtube placement. Slightly increased distention again, but not complaining of any pain, no guarding.  Will decrease tube feed rate to 80ml/hr and continue to monitor.  Phos replaced after discussion with pharmacist   PEG tube should be at 4cm mark on the skin.  The balloon should only be filled up to 70ml of fluid in order to prevent an obstruction.

## 2019-05-18 NOTE — Progress Notes (Signed)
Patient ID: Monterio Bob, male   DOB: November 15, 1928, 85 y.o.   MRN: [id number]  [facility] PROGRESS NOTE  Tiler Brandis JQZ:[id number] DOB: 11-Jun-1929 DOA: 04/20/2019 PCP: Olin Hauser, DO  HPI/Subjective: Patient states he wants to go home.  Tube feeds were just started yesterday.  Patient upset that he cannot be fed.  Objective: Vitals:   05/17/19 2009 05/18/19 0514  BP: (!) 129/96 137/83  Pulse: 95 92  Resp: 18 18  Temp: 97.7 F (36.5 C) 98 F (36.7 C)  SpO2: 94% 94%    Filed Weights   05/13/2019 1434 05/17/19 1105 05/18/19 0514  Weight: 53.5 kg 55.3 kg 54.8 kg    ROS: Review of Systems  Unable to perform ROS: Acuity of condition  Respiratory: Positive for cough. Negative for shortness of breath.   Gastrointestinal: Positive for abdominal pain.   Exam: Physical Exam  HENT:  Nose: No mucosal edema.  Mouth/Throat: No oropharyngeal exudate or posterior oropharyngeal edema.  Eyes: Pupils are equal, round, and reactive to light. Conjunctivae, EOM and lids are normal.  Neck: No JVD present. Carotid bruit is not present. No edema present. No thyroid mass and no thyromegaly present.  Cardiovascular: S1 normal and S2 normal. Exam reveals no gallop.  No murmur heard. Respiratory: No respiratory distress. He has decreased breath sounds in the right lower field and the left lower field. He has no wheezes. He has no rhonchi. He has no rales.  GI: Soft. Bowel sounds are normal. There is no abdominal tenderness.  Musculoskeletal:     Right ankle: He exhibits swelling.     Left ankle: He exhibits swelling.  Lymphadenopathy:    He has no cervical adenopathy.  Neurological: He is alert. No cranial nerve deficit.  Skin: Skin is warm. No rash noted. Nails show no clubbing.  Psychiatric: He has a normal mood and affect.      Data Reviewed: Basic Metabolic Panel: Recent Labs  Lab 05/15/2019 0357 05/10/2019 2103 05/15/19 0531 05/16/19 0502 05/17/19 0557 05/18/19 0634  05/18/19 0635  NA 145  --  146* 144 148*  --  152*  K 3.2*  --  3.8 3.3* 3.6  --  3.4*  CL 112*  --  112* 111 111  --  113*  CO2 27  --  26 29 26   --  30  GLUCOSE 125*  --  138* 132* 136*  --  159*  BUN 28*  --  27* 24* 35*  --  36*  CREATININE 0.53* 0.79 0.92 0.69 0.79  --  0.78  CALCIUM 8.2*  --  7.9* 8.1* 8.4*  --  8.8*  MG 2.0  --  1.9 2.1 2.1 2.1  --   PHOS 1.3*  --  3.1 1.9* 3.3 2.2*  --    CBC: Recent Labs  Lab 05/13/19 0415 04/29/2019 2103 05/15/19 0531 05/16/19 0502  WBC 12.5* 10.6* 13.0* 9.1  HGB 11.5* 13.0 12.4* 12.0*  HCT 37.7* 42.7 40.5 38.9*  MCV 96.4 97.9 97.4 98.5  PLT 145* 161 158 152    CBG: Recent Labs  Lab 05/07/2019 0735 05/15/19 0732 05/16/19 0740 05/17/19 1149 05/18/19 0746  GLUCAP 129* 115* 118* 131* 142*     Studies: Dg Chest Port 1 View  Result Date: 05/16/2019 CLINICAL DATA:  Shortness of Breath EXAM: PORTABLE CHEST 1 VIEW COMPARISON:  05/16/2019 FINDINGS: Heart is normal size. Vague airspace opacity at the left lung base is again noted concerning for infiltrate/pneumonia. He views gaseous distention of  the colon underneath both left and right hemidiaphragms. No effusions. No acute bony abnormality. IMPRESSION: Vague airspace opacity in the left lower lung concerning for infiltrate/pneumonia. Electronically Signed   By: Rolm Baptise M.D.   On: 05/16/2019 23:12   Dg Chest Port 1 View  Addendum Date: 05/16/2019   ADDENDUM REPORT: 05/16/2019 16:48 ADDENDUM: Study discussed by telephone with Dr. Demetrios Loll on 05/16/2019 at 1645 hours, who advises that this patient had a jejunostomy tube surgically placed 2 days ago. Therefore, some pneumoperitoneum could be an expected finding. We discussed that left-side-down decubitus view should confirm or refute, and might also determine the extent of, free air. Electronically Signed   By: Genevie Ann M.D.   On: 05/16/2019 16:48   Result Date: 05/16/2019 CLINICAL DATA:  85 year old male with history of gastric cancer,  suspected aspiration. EXAM: PORTABLE CHEST 1 VIEW COMPARISON:  CT Chest, Abdomen, and Pelvis 04/24/2019 FINDINGS: Portable AP upright view at 1612 hours. Increased gas distended bowel in the right abdomen, but the appearance of lucency under the right hemidiaphragm is such that pneumoperitoneum cannot be excluded. Lower lung volumes. Veiling opacity at the left lung base. Stable cardiac size and mediastinal contours. No pneumothorax or pulmonary edema. No acute osseous abnormality identified. Increased gas-filled bowel loops in the bilateral upper quadrants. Multiple bilateral upper quadrant surgical clips and staple lines. IMPRESSION: 1. Possible pneumoperitoneum, although lucency under the right hemidiaphragm may instead be related to increased gaseous distension of bowel throughout the upper abdomen. Left-side-down lateral decubitus view may be confirmatory. 2. New veiling opacity at the left lung base more resembles pleural effusion, but aspiration or pneumonia are difficult to exclude. Electronically Signed: By: Genevie Ann M.D. On: 05/16/2019 16:39    Scheduled Meds: . Chlorhexidine Gluconate Cloth  6 each Topical Q0600  . docusate  100 mg Per Tube BID  . enoxaparin (LOVENOX) injection  40 mg Subcutaneous Q24H  . feeding supplement (PRO-STAT SUGAR FREE 64)  30 mL Per Tube Daily  . free water  30 mL Per Tube Q4H  . latanoprost  1 drop Both Eyes QHS  . mouth rinse  15 mL Mouth Rinse BID  . multivitamin  15 mL Per Tube Daily   Continuous Infusions: . dextrose 5 % with KCl 20 mEq / L 20 mEq (05/18/19 1127)  . feeding supplement (OSMOLITE 1.5 CAL) 1,000 mL (05/17/19 1107)  . piperacillin-tazobactam (ZOSYN)  IV Stopped (05/18/19 1036)    Assessment/Plan:   1. Severe protein calorie malnutrition with cachexia weight loss and dysphasia.  Patient had J-tube placed by surgery.  Starting tube feedings yesterday.  Slow escalation of tube feeds.  Will speak with dietary tomorrow about goal rate on  feeds. 2. Acute hypoxic respiratory failure.  Off nonrebreather mask and placed on nasal cannula 3. Aspiration pneumonia even though patient n.p.o.  On Zosyn 4. Right upper lobe pulmonary lesion 5. Hypokalemia and hypophosphatemia.  Supplement with tube feeding, and potassium and IV fluids 6. Dehydration hypernatremia.  Restarted D5 with KCl today for hypernatremia 7. Overall prognosis is poor.  Patient was changed to a DNR.   Code Status:     Code Status Orders  (From admission, onward)         Start     Ordered   05/16/19 0944  Do not attempt resuscitation (DNR)  Continuous    Question Answer Comment  In the event of cardiac or respiratory ARREST Do not call a "code blue"   In the event of  cardiac or respiratory ARREST Do not perform Intubation, CPR, defibrillation or ACLS   In the event of cardiac or respiratory ARREST Use medication by any route, position, wound care, and other measures to relive pain and suffering. May use oxygen, suction and manual treatment of airway obstruction as needed for comfort.      05/16/19 0943        Code Status History    Date Active Date Inactive Code Status Order ID Comments User Context   04/17/2019 1337 05/16/2019 0943 Full Code [id number]  Epifanio Lesches, MD Inpatient   Advance Care Planning Activity    Advance Directive Documentation     Most Recent Value  Type of Advance Directive  Healthcare Power of Attorney, Living will  Pre-existing out of facility DNR order (yellow form or pink MOST form)  -  "MOST" Form in Place?  -     Family Communication: Spoke with daughter [name] on the phone.  Disposition Plan: Patient will go out to rehab with hospice following.  Time spent: 28 minutes  [place]

## 2019-05-18 NOTE — Progress Notes (Addendum)
PHARMACY CONSULT NOTE - FOLLOW UP  Pharmacy Consult for Electrolyte Monitoring and Replacement   Recent Labs: Potassium (mmol/L)  Date Value  05/18/2019 3.4 (L)  10/18/2011 4.0   Magnesium (mg/dL)  Date Value  05/17/2019 2.1   Calcium (mg/dL)  Date Value  05/18/2019 8.8 (L)   Calcium, Total (mg/dL)  Date Value  10/18/2011 9.1   Albumin (g/dL)  Date Value  05/14/2019 3.6   Phosphorus (mg/dL)  Date Value  05/17/2019 3.3   Sodium (mmol/L)  Date Value  05/18/2019 152 (H)  10/18/2011 138     Assessment: 85 year old male to restart tube feeds today. Pharmacy contacted by dietitian as patient is at risk of refeeding syndrome. MD consulted pharmacy to monitor electrolytes.  S/p PEG placement 7/29. Patient with one IV access. He is not on any fluids at this time. Tube feeds are still on hold.  Goal of Therapy:  Electrolytes WNL  Plan:  K+ 3.4 - MD started D5 w/ KCl 20 mEq infusion @ 40 mL/hr.   Electrolytes have been ordered with morning labs.  Pharmacy to follow and replace electrolytes as indicated.  Oswald Hillock ,PharmD, BCPS Clinical Pharmacist 05/18/2019 7:38 AM

## 2019-05-19 DIAGNOSIS — J690 Pneumonitis due to inhalation of food and vomit: Secondary | ICD-10-CM

## 2019-05-19 DIAGNOSIS — Z66 Do not resuscitate: Secondary | ICD-10-CM

## 2019-05-19 DIAGNOSIS — L8990 Pressure ulcer of unspecified site, unspecified stage: Secondary | ICD-10-CM | POA: Insufficient documentation

## 2019-05-19 LAB — BASIC METABOLIC PANEL
Anion gap: 9 (ref 5–15)
BUN: 34 mg/dL — ABNORMAL HIGH (ref 8–23)
CO2: 29 mmol/L (ref 22–32)
Calcium: 8.2 mg/dL — ABNORMAL LOW (ref 8.9–10.3)
Chloride: 112 mmol/L — ABNORMAL HIGH (ref 98–111)
Creatinine, Ser: 0.79 mg/dL (ref 0.61–1.24)
GFR calc Af Amer: >60 mL/min (ref >60)
GFR calc non Af Amer: >60 mL/min (ref >60)
Glucose, Bld: 164 mg/dL — ABNORMAL HIGH (ref 70–99)
Potassium: 3.3 mmol/L — ABNORMAL LOW (ref 3.5–5.1)
Sodium: 150 mmol/L — ABNORMAL HIGH (ref 135–145)

## 2019-05-19 LAB — GLUCOSE, CAPILLARY: Glucose-Capillary: 147 mg/dL — ABNORMAL HIGH (ref 70–99)

## 2019-05-19 LAB — MAGNESIUM: Magnesium: 2.1 mg/dL (ref 1.7–2.4)

## 2019-05-19 LAB — PHOSPHORUS: Phosphorus: 2.3 mg/dL — ABNORMAL LOW (ref 2.5–4.6)

## 2019-05-19 MED ORDER — POTASSIUM PHOSPHATES 15 MMOLE/5ML IV SOLN
10.0000 mmol | Freq: Once | INTRAVENOUS | Status: AC
  Administered 2019-05-19: 10 mmol via INTRAVENOUS
  Filled 2019-05-19: qty 3.33

## 2019-05-19 MED ORDER — FREE WATER
90.0000 mL | Status: DC
  Administered 2019-05-19 – 2019-05-23 (×31): 90 mL

## 2019-05-19 MED ORDER — OSMOLITE 1.5 CAL PO LIQD
1000.0000 mL | ORAL | Status: DC
  Administered 2019-05-19 – 2019-05-23 (×7): 1000 mL

## 2019-05-19 MED ORDER — SODIUM CHLORIDE 0.9 % IV SOLN
3.0000 g | Freq: Three times a day (TID) | INTRAVENOUS | Status: DC
  Administered 2019-05-19 – 2019-05-23 (×12): 3 g via INTRAVENOUS
  Filled 2019-05-19 (×2): qty 3
  Filled 2019-05-19: qty 8
  Filled 2019-05-19 (×9): qty 3
  Filled 2019-05-19: qty 8
  Filled 2019-05-19: qty 3

## 2019-05-19 NOTE — Progress Notes (Signed)
1200 mL output from in and out cath. Abdomen still distended and tube feedings still held per order. Will continue to monitor.

## 2019-05-19 NOTE — Progress Notes (Signed)
Nutrition Follow-up  DOCUMENTATION CODES:   Severe malnutrition in context of chronic illness, Underweight  INTERVENTION:  Osmolite 1.5 Cal was resumed at 15 mL/hr per tube. Advance by 15 mL/hr ever 12 hours to goal rate of 45 mL/hr. Also provide Pro-Stat 30 mL once daily per tube. Goal regimen provides 1720 kcal, 83 grams of protein, 821 mL H2O daily.  Provide free water flush of 90 mL Q3hrs. Will provide a total of 1541 mL H2O daily including water in tube feed regimen. Can monitor fluid status and adjust as needed.  Continue liquid MVI daily per tube.  Continue monitoring magnesium, potassium, and phosphorus daily, MD to replete as needed, as pt is at risk for refeeding syndrome.  NUTRITION DIAGNOSIS:   Severe Malnutrition related to chronic illness(hx Billroth II gastrojejunostomy in setting of gastric cancer, dysphagia) as evidenced by severe fat depletion, severe muscle depletion.  Ongoing - addressing with TF regimen.  GOAL:   Patient will meet greater than or equal to 90% of their needs  Progressing with advancement of TF regimen.  MONITOR:   Labs, Weight trends, TF tolerance, I & O's  REASON FOR ASSESSMENT:   Consult Assessment of nutrition requirement/status, Enteral/tube feeding initiation and management  ASSESSMENT:   85 year old male with PMHx of gastric cancer in 1987 s/p removal of 2/3 of stomach, hx resection of some bowel in 1989 (unsure of reason removed), glaucoma, malabsorption syndrome admitted from GI clinic for dysphagia, weight loss with cachexia.   -Tube feeds were initiated on 7/24 at 15 mL/hr. Later that evening patient pulled out his DHT.  -Per GI note from attempted PEG placement on 7/24 there was evidence of Billroth II gastrojejunostomy. -IR placed another Dobbhoff tube on 7/27 and tube feeds were resumed. -On 7/29 patient underwent open jejunostomy tube placement, lysis of adhesions, small bowel repair. -Afterwards patient developed  post-operative ileus and J-tube was initially placed to gravity. -On 8/1 trickle tube feeds were initiated per Surgery. -Overnight tube feeds were stopped due to abdominal distention. Patient denies any N/V. Per bladder scan patient had >999 mL in bladder and in and out was ordered with 1200 mL output.  Met with patient in room. Tube feeds were infusing at 15 mL/hr. Patient reports he is tolerating well. He denies any abdominal pain or N/V. Patient reports he never developed abdominal pain or N/V overnight.  Enteral Access: 22 French Kangaroo J-tube placed by Surgery on 7/29; per chart tube should be at 4cm mark on skin and balloon should only be filled with up to 4 mL of fluid in order to prevent obstruction  TF: pt currently receiving Osmolite 1.5 Cal at 15 mL/hr; also ordered for Pro-Stat 30 mL daily per tube  Medications reviewed and include: Dulcolax 5 mg daily, Colace 100 mg BID, Pro-Stat 30 ml daily, free water flush 30 mL Q4hrs, liquid MVI daily per tube, Zosyn, potassium phosphate 10 mmol IV today.  Labs reviewed: CBG 147, Sodium 150, Potassium 3.3, Chloride 112, BUN 34, Phosphorus 2.3.  Discussed with RN. Discussed with MD via secure chat. Plan is to advance tube feeds per guidelines and also increase free water flush so IV fluids can be weaned/discontinued.  Diet Order:   Diet Order            Diet NPO time specified  Diet effective now             EDUCATION NEEDS:   No education needs have been identified at this time  Skin:  Skin Assessment: Reviewed RN Assessment  Last BM:  05/10/2019 - smear type 5  Height:   Ht Readings from Last 1 Encounters:  05/05/2019 5' 10"  (1.778 m)   Weight:   Wt Readings from Last 1 Encounters:  05/19/19 54.7 kg   Ideal Body Weight:  75.5 kg  BMI:  Body mass index is 17.3 kg/m.  Estimated Nutritional Needs:   Kcal:  1500-1700  Protein:  72-86 grams  Fluid:  1.5 L/day  Willey Blade, MS, RD, LDN Office:  [phone] Pager: [phone] After Hours/Weekend Pager: [phone]

## 2019-05-19 NOTE — Progress Notes (Signed)
MD Surgery Piscoya paged due to abnormally larger abdomen with pain noted on reassessment this morning. Tube feedings stopped. Pt denies nausea/vommiting. Bladder scanned revealing >999 in bladder, Dr. Sidney Ace paged, In and out ordered. Will implement orders and continue to monitor.

## 2019-05-19 NOTE — Progress Notes (Signed)
Subjective:  CC: Brian Ferguson is a 85 y.o. male  Hospital stay day 12, malnutrition POD #5 open jejunostomy tube placement  HPI: Urinary retention overnight.  1.2L out per report.  Pt has no complaints this am.  ROS:  Unable to obtain due to mentation.  Objective:   Temp:  [97.8 F (36.6 C)-98.5 F (36.9 C)] 98.5 F (36.9 C) (08/03 0436) Pulse Rate:  [83-93] 93 (08/03 0436) Resp:  [16-18] 16 (08/03 0436) BP: (124-135)/(77-82) 125/80 (08/03 0436) SpO2:  [85 %-89 %] 89 % (08/03 0436) Weight:  [54.7 kg] 54.7 kg (08/03 0500)     Height: 5\' 10"  (177.8 cm) Weight: 54.7 kg BMI (Calculated): 17.3   Intake/Output this shift:   Intake/Output Summary (Last 24 hours) at 05/19/2019 0803 Last data filed at 05/19/2019 4462 Gross per 24 hour  Intake 839.56 ml  Output 1750 ml  Net -910.44 ml    Constitutional :  alert, appears stated age, cachectic and no distress  Respiratory:  clear to auscultation bilaterally  Cardiovascular:  regular rate and rhythm  Gastrointestinal: soft, decreased distention today compared to previous exam, best it has been so far.  no tenderness.  jtube site clean.  staples c/d/i.Marland Kitchen   Skin: Cool and moist.   Psychiatric: Normal affect, non-agitated, not confused       LABS:  CMP Latest Ref Rng & Units 05/19/2019 05/18/2019 05/17/2019  Glucose 70 - 99 mg/dL 164(H) 159(H) 136(H)  BUN 8 - 23 mg/dL 34(H) 36(H) 35(H)  Creatinine 0.61 - 1.24 mg/dL 0.79 0.78 0.79  Sodium 135 - 145 mmol/L 150(H) 152(H) 148(H)  Potassium 3.5 - 5.1 mmol/L 3.3(L) 3.4(L) 3.6  Chloride 98 - 111 mmol/L 112(H) 113(H) 111  CO2 22 - 32 mmol/L 29 30 26   Calcium 8.9 - 10.3 mg/dL 8.2(L) 8.8(L) 8.4(L)  Total Protein 6.5 - 8.1 g/dL - - -  Total Bilirubin 0.3 - 1.2 mg/dL - - -  Alkaline Phos 38 - 126 U/L - - -  AST 15 - 41 U/L - - -  ALT 0 - 44 U/L - - -   CBC Latest Ref Rng & Units 05/16/2019 05/15/2019 04/29/2019  WBC 4.0 - 10.5 K/uL 9.1 13.0(H) 10.6(H)  Hemoglobin 13.0 - 17.0 g/dL 12.0(L) 12.4(L)  13.0  Hematocrit 39.0 - 52.0 % 38.9(L) 40.5 42.7  Platelets 150 - 400 K/uL 152 158 161    RADS: n/a Assessment:   Cachexia, malnourishment secondary to dysphagia. Incidental lung lesion. Discussed case with oncology and they recommend maximizing nutritional status first in case lung lesion requires additional treatment.  S/p open Jtube placement.  Distention improved after bladder decompression.  OK to resume tube feeds at 48ml/hr and advance as noted in orders.  Monitor lytes for refeeding syndrome.  Stage II pressure ulcer sacrum.  Keep padded.  PEG tube should be at 4cm mark on the skin.  The balloon should only be filled up to 72ml of fluid in order to prevent an obstruction.

## 2019-05-19 NOTE — Progress Notes (Signed)
Patient ID: Brian Ferguson, male   DOB: [birth year], 85 y.o.   MRN: [id number]  ACP note.  Patient unable to participate in conversation. Because of the hospitals visitor policy I spoke with the patient's daughter [name] and [name] on the phone.  Patient less interactive today and states that he is tired.  I tend to believe people when they are tired and I am wondering if he is giving up.  The last few days he was more argumentative with me about the plan.  He wants to go home.  The patient has severe protein calorie malnutrition with cachexia and weight loss and dysphasia.  The patient had a J-tube and is on low dose feeding.  Patient also had acute hypoxic respiratory failure and aspiration pneumonia even though he is n.p.o.  He is on antibiotics.  The patient has a right upper lobe pulmonary lesion.  The patient also has hypernatremia.  The patient is a DO NOT RESUSCITATE already.  Social worker will look into a facility that can do continuous tube feeding.  I also mentioned to the daughter that he would be a candidate for hospice.  The goal of hospice would be to have comfort.  There I would stop the tube feeding.  The patient's daughter will speak with her sister.  I advised that family should come and visit him.  I will touch base with family again tomorrow and see if anything is changed with the plan.  Time spent on ACP discussion 17 minutes Dr Loletha Grayer

## 2019-05-19 NOTE — Progress Notes (Signed)
Patient ID: Brian Ferguson, male   DOB: 1929-09-10, 85 y.o.   MRN: [id number]  [facility] PROGRESS NOTE  Brian Ferguson QIH:[id number] DOB: 12-18-1928 DOA: 04/23/2019 PCP: Olin Hauser, DO  HPI/Subjective: Patient stated he is tired.  I asked him if he slept last night and he said yes.  He did not fight and argue me like he did the last few days.  Objective: Vitals:   05/18/19 1959 05/19/19 0436  BP:  125/80  Pulse: 83 93  Resp:  16  Temp:  98.5 F (36.9 C)  SpO2: (!) 89% (!) 89%    Filed Weights   05/17/19 1105 05/18/19 0514 05/19/19 0500  Weight: 55.3 kg 54.8 kg 54.7 kg    ROS: Review of Systems  Unable to perform ROS: Acuity of condition  Respiratory: Positive for cough. Negative for shortness of breath.   Gastrointestinal: Positive for abdominal pain.   Exam: Physical Exam  HENT:  Nose: No mucosal edema.  Mouth/Throat: No oropharyngeal exudate or posterior oropharyngeal edema.  Eyes: Pupils are equal, round, and reactive to light. Conjunctivae, EOM and lids are normal.  Neck: No JVD present. Carotid bruit is not present. No edema present. No thyroid mass and no thyromegaly present.  Cardiovascular: S1 normal and S2 normal. Exam reveals no gallop.  No murmur heard. Respiratory: No respiratory distress. He has decreased breath sounds in the right lower field and the left lower field. He has no wheezes. He has no rhonchi. He has no rales.  GI: Soft. Bowel sounds are normal. There is no abdominal tenderness.  Musculoskeletal:     Right ankle: He exhibits swelling.     Left ankle: He exhibits swelling.  Lymphadenopathy:    He has no cervical adenopathy.  Neurological: He is alert. No cranial nerve deficit.  Skin: Skin is warm. No rash noted. Nails show no clubbing.  Psychiatric: He has a normal mood and affect.      Data Reviewed: Basic Metabolic Panel: Recent Labs  Lab 05/15/19 0531 05/16/19 0502 05/17/19 0557 05/18/19 0634 05/18/19 0635  05/19/19 0615  NA 146* 144 148*  --  152* 150*  K 3.8 3.3* 3.6  --  3.4* 3.3*  CL 112* 111 111  --  113* 112*  CO2 26 29 26   --  30 29  GLUCOSE 138* 132* 136*  --  159* 164*  BUN 27* 24* 35*  --  36* 34*  CREATININE 0.92 0.69 0.79  --  0.78 0.79  CALCIUM 7.9* 8.1* 8.4*  --  8.8* 8.2*  MG 1.9 2.1 2.1 2.1  --  2.1  PHOS 3.1 1.9* 3.3 2.2*  --  2.3*   CBC: Recent Labs  Lab 05/13/19 0415 05/11/2019 2103 05/15/19 0531 05/16/19 0502  WBC 12.5* 10.6* 13.0* 9.1  HGB 11.5* 13.0 12.4* 12.0*  HCT 37.7* 42.7 40.5 38.9*  MCV 96.4 97.9 97.4 98.5  PLT 145* 161 158 152    CBG: Recent Labs  Lab 05/15/19 0732 05/16/19 0740 05/17/19 1149 05/18/19 0746 05/19/19 0741  GLUCAP 115* 118* 131* 142* 147*     Scheduled Meds: . bisacodyl  5 mg Oral Daily  . Chlorhexidine Gluconate Cloth  6 each Topical Q0600  . docusate  100 mg Per Tube BID  . enoxaparin (LOVENOX) injection  40 mg Subcutaneous Q24H  . feeding supplement (PRO-STAT SUGAR FREE 64)  30 mL Per Tube Daily  . free water  90 mL Per Tube Q3H  . latanoprost  1 drop Both  Eyes QHS  . mouth rinse  15 mL Mouth Rinse BID  . multivitamin  15 mL Per Tube Daily   Continuous Infusions: . ampicillin-sulbactam (UNASYN) IV 3 g (05/19/19 1457)  . dextrose 5 % with KCl 20 mEq / L 40 mL/hr at 05/18/19 1438  . feeding supplement (OSMOLITE 1.5 CAL) 1,000 mL (05/19/19 0920)  . potassium PHOSPHATE IVPB (in mmol) 10 mmol (05/19/19 1055)    Assessment/Plan:  1. Severe protein calorie malnutrition with cachexia weight loss and dysphasia.  Patient had J-tube placed by surgery.  Try to advance feeding to goal.. 2. Acute hypoxic respiratory failure.  Off nonrebreather mask and placed on nasal cannula 3. Aspiration pneumonia even though patient n.p.o.  On Unasyn. 4. Right upper lobe pulmonary lesion 5. Hypokalemia and hypophosphatemia.  Supplement with tube feeding, and potassium and IV fluids 6. Dehydration hypernatremia.  Continue D5 with KCl today  for hypernatremia 7. Overall prognosis is poor.  Patient is a DNR 8.   Urinary retension.  Foley catheter ordered.   Code Status:     Code Status Orders  (From admission, onward)         Start     Ordered   05/16/19 0944  Do not attempt resuscitation (DNR)  Continuous    Question Answer Comment  In the event of cardiac or respiratory ARREST Do not call a "code blue"   In the event of cardiac or respiratory ARREST Do not perform Intubation, CPR, defibrillation or ACLS   In the event of cardiac or respiratory ARREST Use medication by any route, position, wound care, and other measures to relive pain and suffering. May use oxygen, suction and manual treatment of airway obstruction as needed for comfort.      05/16/19 0943        Code Status History    Date Active Date Inactive Code Status Order ID Comments User Context   05/05/2019 1337 05/16/2019 0943 Full Code [id number]  Epifanio Lesches, MD Inpatient   Advance Care Planning Activity    Advance Directive Documentation     Most Recent Value  Type of Advance Directive  Healthcare Power of Attorney, Living will  Pre-existing out of facility DNR order (yellow form or pink MOST form)  -  "MOST" Form in Place?  -     Family Communication: Spoke with daughter [name] on the phone.  Disposition Plan: Social worker to look into facilities that can do continuous tube feeding.  Also spoke with the daughter [name] about potential for hospice home and stopping the tube feedings.  Time spent: 34 minutes, including ACP time  [employer]

## 2019-05-19 NOTE — Progress Notes (Signed)
Hematology/Oncology Progress Note [facility] [phone] [phone]  Patient Care Team: Olin Hauser, DO as PCP - General (Family Medicine)   Name of the patient: Brian Ferguson  [id number]  [birth year]  Date of visit: 05/19/19   INTERVAL HISTORY-  Patient is lying in bed, breathing via nasal cannula oxygen. He was confused and thought he was in his daughter's house. He wants me to help him to stand up and walk. Not able to provide additional history or complaints.    Review of systems- Review of Systems  Unable to perform ROS: Mental status change    No Known Allergies  Patient Active Problem List   Diagnosis Date Noted   Pressure injury of skin 05/19/2019   Protein-calorie malnutrition, severe 05/03/2019   Palliative care encounter    Dysphagia    Lung mass    Cachexia ([place]) 05/02/2019   History of gastric cancer 03/18/2019   Malnutrition ([place]) 03/18/2019   Recurrent falls 03/18/2019   Generalized weakness 03/18/2019     Past Medical History:  Diagnosis Date   Cancer ([place]) 1987   stomach. 2/3 of stomach removed   Confusion    alert and oriented at times   Coronary artery disease    Glaucoma    History of blood clots    Malabsorption syndrome      Past Surgical History:  Procedure Laterality Date   GASTROSTOMY N/A 05/11/2019   Procedure: INSERTION OF GASTROSTOMY TUBE;  Surgeon: Benjamine Sprague, DO;  Location: [facility] ORS;  Service: General;  Laterality: N/A;   HERNIA REPAIR     PEG PLACEMENT N/A 04/24/2019   Procedure: PERCUTANEOUS ENDOSCOPIC GASTROSTOMY (PEG) PLACEMENT;  Surgeon: Toledo, Benay Pike, MD;  Location: [facility] ENDOSCOPY;  Service: Gastroenterology;  Laterality: N/A;   STOMACH SURGERY  1987   removed 2/3 of stomach d/t cancer    Social History   Socioeconomic History   Marital status: Widowed    Spouse name: Not on file   Number of children: Not on file   Years of  education: Not on file   Highest education level: Not on file  Occupational History   Not on file  Social Needs   Financial resource strain: Not on file   Food insecurity    Worry: Not on file    Inability: Not on file   Transportation needs    Medical: Not on file    Non-medical: Not on file  Tobacco Use   Smoking status: Former Smoker    Types: Cigarettes   Smokeless tobacco: Former Systems developer  Substance and Sexual Activity   Alcohol use: Not Currently   Drug use: No   Sexual activity: Not on file  Lifestyle   Physical activity    Days per week: Not on file    Minutes per session: Not on file   Stress: Not on file  Relationships   Social connections    Talks on phone: Not on file    Gets together: Not on file    Attends religious service: Not on file    Active member of club or organization: Not on file    Attends meetings of clubs or organizations: Not on file    Relationship status: Not on file   Intimate partner violence    Fear of current or ex partner: Not on file    Emotionally abused: Not on file    Physically abused: Not on file    Forced sexual activity: Not on file  Other Topics Concern   Not on file  Social History Narrative   Lives at home with son.     History reviewed. No pertinent family history.   Current Facility-Administered Medications:    acetaminophen (TYLENOL) tablet 650 mg, 650 mg, Oral, Q6H PRN **OR** acetaminophen (TYLENOL) suppository 650 mg, 650 mg, Rectal, Q6H PRN, Sakai, Isami, DO   Ampicillin-Sulbactam (UNASYN) 3 g in sodium chloride 0.9 % 100 mL IVPB, 3 g, Intravenous, Q8H, Wieting, Richard, MD, Last Rate: 200 mL/hr at 05/19/19 1457, 3 g at 05/19/19 1457   bisacodyl (DULCOLAX) EC tablet 5 mg, 5 mg, Oral, Daily, Sakai, Isami, DO, 5 mg at 05/19/19 1036   Chlorhexidine Gluconate Cloth 2 % PADS 6 each, 6 each, Topical, Q0600, Sakai, Isami, DO, 6 each at 05/19/19 0636   dextrose 5 % with KCl 20 mEq / L  infusion, 20 mEq,  Intravenous, Continuous, Wieting, Richard, MD, Last Rate: 40 mL/hr at 05/18/19 1438   docusate (COLACE) 50 MG/5ML liquid 100 mg, 100 mg, Per Tube, BID, Epifanio Lesches, MD, 100 mg at 05/19/19 1038   enoxaparin (LOVENOX) injection 40 mg, 40 mg, Subcutaneous, Q24H, Sakai, Isami, DO, 40 mg at 05/19/19 1036   feeding supplement (OSMOLITE 1.5 CAL) liquid 1,000 mL, 1,000 mL, Per Tube, Continuous, Sakai, Isami, DO, Last Rate: 15 mL/hr at 05/19/19 0920, 1,000 mL at 05/19/19 0920   feeding supplement (PRO-STAT SUGAR FREE 64) liquid 30 mL, 30 mL, Per Tube, Daily, Sakai, Isami, DO, 30 mL at 05/19/19 1038   free water 90 mL, 90 mL, Per Tube, Q3H, Loletha Grayer, MD, 90 mL at 05/19/19 1153   HYDROcodone-acetaminophen (NORCO/VICODIN) 5-325 MG per tablet 1-2 tablet, 1-2 tablet, Oral, Q4H PRN, Sakai, Isami, DO   latanoprost (XALATAN) 0.005 % ophthalmic solution 1 drop, 1 drop, Both Eyes, QHS, Sakai, Isami, DO, 1 drop at 05/18/19 2117   MEDLINE mouth rinse, 15 mL, Mouth Rinse, BID, Sakai, Isami, DO, 15 mL at 05/19/19 1039   multivitamin liquid 15 mL, 15 mL, Per Tube, Daily, Sakai, Isami, DO, 15 mL at 05/19/19 1037   ondansetron (ZOFRAN) tablet 4 mg, 4 mg, Oral, Q6H PRN **OR** ondansetron (ZOFRAN) injection 4 mg, 4 mg, Intravenous, Q6H PRN, Sakai, Isami, DO, 4 mg at 05/11/19 1734   potassium PHOSPHATE 10 mmol in dextrose 5 % 250 mL infusion, 10 mmol, Intravenous, Once, Loletha Grayer, MD, Last Rate: 42 mL/hr at 05/19/19 1055, 10 mmol at 05/19/19 1055   traZODone (DESYREL) tablet 25 mg, 25 mg, Oral, QHS PRN, Lysle Pearl, Isami, DO, 25 mg at 05/18/19 2117   Physical exam:  Vitals:   05/18/19 1948 05/18/19 1959 05/19/19 0436 05/19/19 0500  BP: 135/77  125/80   Pulse: 87 83 93   Resp: 18  16   Temp: 97.8 F (36.6 C)  98.5 F (36.9 C)   TempSrc: Oral  Oral   SpO2: (!) 85% (!) 89% (!) 89%   Weight:    120 lb 9.5 oz (54.7 kg)  Height:       Physical Exam  Constitutional: No distress.  Cachexia    HENT:  Head: Normocephalic and atraumatic.  Eyes: Pupils are equal, round, and reactive to light.  Neck: Neck supple.  Cardiovascular: Normal rate and regular rhythm.  Pulmonary/Chest: Effort normal. He has no wheezes.   nasal cannula oxygen   Abdominal: Soft.  J-tube  Neurological: He is alert.  Not orientated to place  Skin: Skin is warm and dry.  Psychiatric:  Anxious  CMP Latest Ref Rng & Units 05/19/2019  Glucose 70 - 99 mg/dL 164(H)  BUN 8 - 23 mg/dL 34(H)  Creatinine 0.61 - 1.24 mg/dL 0.79  Sodium 135 - 145 mmol/L 150(H)  Potassium 3.5 - 5.1 mmol/L 3.3(L)  Chloride 98 - 111 mmol/L 112(H)  CO2 22 - 32 mmol/L 29  Calcium 8.9 - 10.3 mg/dL 8.2(L)  Total Protein 6.5 - 8.1 g/dL -  Total Bilirubin 0.3 - 1.2 mg/dL -  Alkaline Phos 38 - 126 U/L -  AST 15 - 41 U/L -  ALT 0 - 44 U/L -   CBC Latest Ref Rng & Units 05/16/2019  WBC 4.0 - 10.5 K/uL 9.1  Hemoglobin 13.0 - 17.0 g/dL 12.0(L)  Hematocrit 39.0 - 52.0 % 38.9(L)  Platelets 150 - 400 K/uL 152    RADIOGRAPHIC STUDIES: I have personally reviewed the radiological images as listed and agreed with the findings in the report.  Ct Head Wo Contrast  Result Date: 05/15/2019 CLINICAL DATA:  Dysphagia.  Lung nodule. EXAM: CT HEAD WITHOUT CONTRAST TECHNIQUE: Contiguous axial images were obtained from the base of the skull through the vertex without intravenous contrast. COMPARISON:  None. FINDINGS: Brain: Moderate atrophy. 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 Chronic ischemic change in the white matter bilaterally. Small chronic infarct left cerebellum. Negative for acute infarct. Negative for hemorrhage mass or edema. No midline shift. Vascular: Negative for hyperdense vessel. Atherosclerotic calcification in the cavernous carotid bilaterally. Skull: Negative Sinuses/Orbits: Paranasal sinuses clear.  Bilateral cataract surgery Other: None IMPRESSION: Atrophy and chronic ischemic change.  No acute abnormality.  Electronically Signed   By: Franchot Gallo M.D.   On: 05/15/2019 18:40   Ct Chest W Contrast  Result Date: 05/08/2019 CLINICAL DATA:  Remote history of gastric cancer. Patient is having progressive weight loss. Symptoms of dysphagia. EXAM: CT CHEST, ABDOMEN, AND PELVIS WITH CONTRAST TECHNIQUE: Multidetector CT imaging of the chest, abdomen and pelvis was performed following the standard protocol during bolus administration of intravenous contrast. CONTRAST:  37m OMNIPAQUE IOHEXOL 300 MG/ML  SOLN COMPARISON:  None. FINDINGS: CT CHEST FINDINGS Cardiovascular: The heart is normal in size. There is age-related tortuosity, ectasia and calcification of the thoracic aorta but no dissection or focal aneurysm. The branch vessels are patent. Moderate three-vessel coronary artery calcifications are noted. Mediastinum/Nodes: No mediastinal or hilar mass or lymphadenopathy. The esophagus is grossly normal. Lungs/Pleura: There is a 2.5 x 1.4 x 1.3 cm lesion in the right upper lobe suspicious for neoplasm but possibly scar tissue. Without prior studies for comparison PET-CT may be helpful for further evaluation. Patchy areas of tree-in-bud opacity in the left upper lobe and left lower lobe suggesting chronic inflammation or possibly atypical infection such as MAC. Endobronchial debris in the right lower lobe along with peribronchial thickening may suggest aspiration. No right lower lobe pneumonia. Patchy atelectasis or scarring change noted in the superior segment of the left lower lobe. Musculoskeletal: Fairly marked cachexia. No chest wall mass or axillary adenopathy. The bony structures are intact.  No bone lesions are identified. CT ABDOMEN PELVIS FINDINGS Hepatobiliary: Numerous hepatic cysts. No worrisome hepatic lesions or intrahepatic biliary dilatation. Pancreas: No mass, inflammation or ductal dilatation. Moderate pancreatic atrophy. Spleen: Normal size.  No worrisome lesions. Adrenals/Urinary Tract: The adrenal  glands and kidneys are unremarkable. A right renal cyst is noted with some thin peripheral calcifications. No hydronephrosis. The bladder is grossly normal. Stomach/Bowel: Surgical changes noted involving the stomach but I do not see any  findings suspicious for recurrent mass. The small bowel and colon are grossly normal. Vascular/Lymphatic: Marked tortuosity and mild ectasia of the abdominal aorta with moderate atherosclerotic calcifications but no focal aneurysm or dissection. The branch vessels are patent. There is marked tortuosity, ectasia and calcification of the iliac arteries. 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 No obvious mesenteric or retroperitoneal mass or adenopathy. Reproductive: The prostate gland and seminal vesicles are grossly normal. Other: No pelvic mass or free pelvic fluid collections. Musculoskeletal: Scoliosis and advanced degenerative lumbar spondylosis but no worrisome bone lesions. IMPRESSION: 1. Worrisome right upper lobe pulmonary lesion measuring 2.5 x 1.4 x 1.3 cm. PET-CT may be helpful for further evaluation. No mediastinal or hilar lymphadenopathy or evidence of metastatic disease elsewhere. 2. Patchy areas of tree-in-bud appearance suggesting chronic inflammation or atypical infection such as MAC. 3. Moderate amount of endobronchial debris in the right lower lobe along with peribronchial thickening possibly due to aspiration but no definite findings for pneumonia. 4. Numerous hepatic cysts but no worrisome hepatic lesions. There is also a complex upper pole right renal cyst with areas of peripheral calcification but no solid enhancing components. 5. Advanced vascular disease. 6. Surgical changes from gastric surgery without complicating features. I do not see any definite findings for recurrent gastric mass. Electronically Signed   By: Marijo Sanes M.D.   On: 05/08/2019 08:51   Ct Abdomen Pelvis W Contrast  Result Date:  05/08/2019 CLINICAL DATA:  Remote history of gastric cancer. Patient is having progressive weight loss. Symptoms of dysphagia. EXAM: CT CHEST, ABDOMEN, AND PELVIS WITH CONTRAST TECHNIQUE: Multidetector CT imaging of the chest, abdomen and pelvis was performed following the standard protocol during bolus administration of intravenous contrast. CONTRAST:  25m OMNIPAQUE IOHEXOL 300 MG/ML  SOLN COMPARISON:  None. FINDINGS: CT CHEST FINDINGS Cardiovascular: The heart is normal in size. There is age-related tortuosity, ectasia and calcification of the thoracic aorta but no dissection or focal aneurysm. The branch vessels are patent. Moderate three-vessel coronary artery calcifications are noted. Mediastinum/Nodes: No mediastinal or hilar mass or lymphadenopathy. The esophagus is grossly normal. Lungs/Pleura: There is a 2.5 x 1.4 x 1.3 cm lesion in the right upper lobe suspicious for neoplasm but possibly scar tissue. Without prior studies for comparison PET-CT may be helpful for further evaluation. Patchy areas of tree-in-bud opacity in the left upper lobe and left lower lobe suggesting chronic inflammation or possibly atypical infection such as MAC. Endobronchial debris in the right lower lobe along with peribronchial thickening may suggest aspiration. No right lower lobe pneumonia. Patchy atelectasis or scarring change noted in the superior segment of the left lower lobe. Musculoskeletal: Fairly marked cachexia. No chest wall mass or axillary adenopathy. The bony structures are intact.  No bone lesions are identified. CT ABDOMEN PELVIS FINDINGS Hepatobiliary: Numerous hepatic cysts. No worrisome hepatic lesions or intrahepatic biliary dilatation. Pancreas: No mass, inflammation or ductal dilatation. Moderate pancreatic atrophy. Spleen: Normal size.  No worrisome lesions. Adrenals/Urinary Tract: The adrenal glands and kidneys are unremarkable. A right renal cyst is noted with some thin peripheral calcifications. No  hydronephrosis. The bladder is grossly normal. Stomach/Bowel: Surgical changes noted involving the stomach but I do not see any findings suspicious for recurrent mass. The small bowel and colon are grossly normal. Vascular/Lymphatic: Marked tortuosity and mild ectasia of the abdominal aorta with moderate atherosclerotic calcifications but no focal aneurysm or dissection. The branch vessels are patent. There is marked tortuosity, ectasia and calcification of the  iliac arteries. 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 No obvious mesenteric or retroperitoneal mass or adenopathy. Reproductive: The prostate gland and seminal vesicles are grossly normal. Other: No pelvic mass or free pelvic fluid collections. Musculoskeletal: Scoliosis and advanced degenerative lumbar spondylosis but no worrisome bone lesions. IMPRESSION: 1. Worrisome right upper lobe pulmonary lesion measuring 2.5 x 1.4 x 1.3 cm. PET-CT may be helpful for further evaluation. No mediastinal or hilar lymphadenopathy or evidence of metastatic disease elsewhere. 2. Patchy areas of tree-in-bud appearance suggesting chronic inflammation or atypical infection such as MAC. 3. Moderate amount of endobronchial debris in the right lower lobe along with peribronchial thickening possibly due to aspiration but no definite findings for pneumonia. 4. Numerous hepatic cysts but no worrisome hepatic lesions. There is also a complex upper pole right renal cyst with areas of peripheral calcification but no solid enhancing components. 5. Advanced vascular disease. 6. Surgical changes from gastric surgery without complicating features. I do not see any definite findings for recurrent gastric mass. Electronically Signed   By: Marijo Sanes M.D.   On: 05/08/2019 08:51   Dg Carlena Hurl Op Medicare Speech Path  Result Date: 05/01/2019 CLINICAL DATA:  Weight loss.  Dysphagia. EXAM: MODIFIED BARIUM SWALLOW TECHNIQUE: Different  consistencies of barium were administered orally to the patient by the Speech Pathologist. Imaging of the pharynx was performed in the lateral projection. The radiologist was present in the fluoroscopy room for this study, providing personal supervision. FLUOROSCOPY TIME:  Fluoroscopy Time:  1 minutes Number of Acquired Spot Images: 0 COMPARISON:  None. FINDINGS: There was aspiration during the study. Please see the speech pathologist's report for full details. IMPRESSION: There was aspiration during the study. Please see the speech pathologist's report for full details. Please refer to the Speech Pathologists report for complete details and recommendations. Electronically Signed   By: Dorise Bullion III M.D   On: 05/01/2019 14:08   Dg Chest Port 1 View  Result Date: 05/16/2019 CLINICAL DATA:  Shortness of Breath EXAM: PORTABLE CHEST 1 VIEW COMPARISON:  05/16/2019 FINDINGS: Heart is normal size. Vague airspace opacity at the left lung base is again noted concerning for infiltrate/pneumonia. He views gaseous distention of the colon underneath both left and right hemidiaphragms. No effusions. No acute bony abnormality. IMPRESSION: Vague airspace opacity in the left lower lung concerning for infiltrate/pneumonia. Electronically Signed   By: Rolm Baptise M.D.   On: 05/16/2019 23:12   Dg Chest Port 1 View  Addendum Date: 05/16/2019   ADDENDUM REPORT: 05/16/2019 16:48 ADDENDUM: Study discussed by telephone with Dr. Demetrios Loll on 05/16/2019 at 1645 hours, who advises that this patient had a jejunostomy tube surgically placed 2 days ago. Therefore, some pneumoperitoneum could be an expected finding. We discussed that left-side-down decubitus view should confirm or refute, and might also determine the extent of, free air. Electronically Signed   By: Genevie Ann M.D.   On: 05/16/2019 16:48   Result Date: 05/16/2019 CLINICAL DATA:  85 year old male with history of gastric cancer, suspected aspiration. EXAM: PORTABLE CHEST  1 VIEW COMPARISON:  CT Chest, Abdomen, and Pelvis 04/17/2019 FINDINGS: Portable AP upright view at 1612 hours. Increased gas distended bowel in the right abdomen, but the appearance of lucency under the right hemidiaphragm is such that pneumoperitoneum cannot be excluded. Lower lung volumes. Veiling opacity at the left lung base. Stable cardiac size and mediastinal contours. No pneumothorax or pulmonary edema. No acute osseous abnormality identified. Increased gas-filled bowel  loops in the bilateral upper quadrants. Multiple bilateral upper quadrant surgical clips and staple lines. IMPRESSION: 1. Possible pneumoperitoneum, although lucency under the right hemidiaphragm may instead be related to increased gaseous distension of bowel throughout the upper abdomen. Left-side-down lateral decubitus view may be confirmatory. 2. New veiling opacity at the left lung base more resembles pleural effusion, but aspiration or pneumonia are difficult to exclude. Electronically Signed: By: Genevie Ann M.D. On: 05/16/2019 16:39   Dg Addison Bailey G Tube Plc W/fl W/rad  Result Date: 05/12/2019 CLINICAL DATA:  Malnutrition, tube placement EXAM: NASO G TUBE PLACEMENT WITH FL AND WITH RAD CONTRAST:  10 mL [id number] FLUOROSCOPY TIME:  Fluoroscopy Time:  00:30 Number of Acquired Spot Images: 1 COMPARISON:  None. FINDINGS: Using lidocaine jelly for local anesthesia, a weighted enteric feeding tube was advanced through the right nostril to the partially resected stomach under fluoroscopic guidance without complication. A small volume of water soluble contrast was injected to confirm intragastric tube location and the tube was secured. IMPRESSION: Successful fluoroscopic placement of weighted enteric feeding tube within the partially resected stomach. Electronically Signed   By: Eddie Candle M.D.   On: 05/12/2019 09:18   Dg Addison Bailey G Tube Plc W/fl W/rad  Result Date: 04/19/2019 CLINICAL DATA:  Feeding tube needed. EXAM: NASO G TUBE PLACEMENT WITH  FL AND WITH RAD CONTRAST:  None. FLUOROSCOPY TIME:  Fluoroscopy Time:  4 minutes 42 seconds Radiation Exposure Index (if provided by the fluoroscopic device): 43.0 mGy COMPARISON:  CT 05/04/2019. FINDINGS: A feeding tube was placed through the left nares down the esophagus into the stomach under fluoroscopic guidance. There were no complications. IMPRESSION: Successful feeding tube placement. Electronically Signed   By: Marcello Moores  Register   On: 04/20/2019 14:56   Dg Foot Complete Right  Result Date: 05/02/2019 Please see detailed radiograph report in office note.   Assessment and plan-  Patient is a 85 y.o. male with a history of gastric cancer in 1987, status post partial gastrectomy, History of blood clots, glaucoma, malabsorption syndrome presented for evaluation of progressive weight loss.  Was found to have severe dysphagia.  Image work-up showed lung mass.  #Cachexia malnutrition secondary to dysphagia. Status post J-tube placement.  Continue feeding via feeding tubes.  #Acute hypoxic respiratory failure, likely secondary to aspiration pneumonia.  Antibiotics were switched to Unasyn.  #Right upper lobe lung mass Patient is confused and not able to have any meaningful conversation with me today. I called patient's daughter and she informs me that current plan is most likely for him to stabilize and then go to rehab facility and maybe hospice from there.  She feels that given patient's multiple comorbidities, poor performance status, additional work-up for lung nodule/mass may not help to improve his overall condition.  She will discuss with her sister.  #History of gastric cancer, remote history in 68.  Pathology is not available to me at this point. No definitive radiographic evidence of local or distant metastasis at this point. CODE STATUS DNR/DNI. Oncology will sign off.  Please call if additional concerns or problems. Thank you for allowing me to participate in the care of this  patient.   Earlie Server, MD, PhD Hematology Oncology [facility] Pager- [phone] 05/19/2019

## 2019-05-19 NOTE — Care Management Important Message (Signed)
Important Message  Patient Details  Name: Brian Ferguson MRN: [id number] Date of Birth: Mar 09, 1929   Medicare Important Message Given:  Yes     Juliann Pulse A Ugonna Keirsey 05/19/2019, 11:09 AM

## 2019-05-19 NOTE — Progress Notes (Signed)
PHARMACY CONSULT NOTE - FOLLOW UP  Pharmacy Consult for Electrolyte Monitoring and Replacement   Recent Labs: Potassium (mmol/L)  Date Value  05/19/2019 3.3 (L)  10/18/2011 4.0   Magnesium (mg/dL)  Date Value  05/19/2019 2.1   Calcium (mg/dL)  Date Value  05/19/2019 8.2 (L)   Calcium, Total (mg/dL)  Date Value  10/18/2011 9.1   Albumin (g/dL)  Date Value  05/04/2019 3.6   Phosphorus (mg/dL)  Date Value  05/19/2019 2.3 (L)   Sodium (mmol/L)  Date Value  05/19/2019 150 (H)  10/18/2011 138     Assessment: 85 year old male to restart tube feeds today. Pharmacy contacted by dietitian as patient is at risk of refeeding syndrome. MD consulted pharmacy to monitor electrolytes.  S/p PEG placement 7/29. Patient with one IV access.   Goal of Therapy:  Electrolytes WNL  Plan:  K+ 3.3 - MD started D5 w/ KCl 20 mEq infusion @ 40 mL/hr.   Phos 2.3 - Will supplement with KPhos 10 mmol IV times one  Mag 2.1 - No supplementation required.  Electrolytes have been ordered with morning labs.  Pharmacy to follow and replace electrolytes as indicated.  Paulina Fusi, PharmD, BCPS 05/19/2019 9:22 AM

## 2019-05-20 LAB — PHOSPHORUS: Phosphorus: 2.1 mg/dL — ABNORMAL LOW (ref 2.5–4.6)

## 2019-05-20 LAB — GLUCOSE, CAPILLARY: Glucose-Capillary: 151 mg/dL — ABNORMAL HIGH (ref 70–99)

## 2019-05-20 LAB — MAGNESIUM: Magnesium: 2.1 mg/dL (ref 1.7–2.4)

## 2019-05-20 MED ORDER — LACTULOSE 10 GM/15ML PO SOLN
20.0000 g | Freq: Once | ORAL | Status: AC
  Administered 2019-05-20: 20 g
  Filled 2019-05-20: qty 30

## 2019-05-20 NOTE — Progress Notes (Addendum)
Subjective:  CC: Brian Ferguson is a 85 y.o. male  Hospital stay day 13, malnutrition POD #5 open jejunostomy tube placement  HPI: Foley placed overnight.  Asking for water again.  ROS:  Unable to obtain due to mentation.  Objective:   Temp:  [97.9 F (36.6 C)-98.1 F (36.7 C)] 97.9 F (36.6 C) (08/04 0442) Pulse Rate:  [73-85] 85 (08/04 0442) Resp:  [17-18] 18 (08/04 0442) BP: (111-126)/(69-75) 126/75 (08/04 0442) SpO2:  [95 %-98 %] 95 % (08/04 0442)     Height: 5\' 10"  (177.8 cm) Weight: 54.7 kg BMI (Calculated): 17.3   Intake/Output this shift:   Intake/Output Summary (Last 24 hours) at 05/20/2019 0730 Last data filed at 05/20/2019 0548 Gross per 24 hour  Intake 1134.07 ml  Output 1700 ml  Net -565.93 ml    Constitutional :  alert, appears stated age, cachectic and no distress  Respiratory:  clear to auscultation bilaterally  Cardiovascular:  regular rate and rhythm  Gastrointestinal: soft, stable distention today compared to previous exam, no tenderness.  jtube site clean.  staples c/d/i.Marland Kitchen   Skin: Cool and moist.   Psychiatric: Normal affect, non-agitated, not confused       LABS:  CMP Latest Ref Rng & Units 05/19/2019 05/18/2019 05/17/2019  Glucose 70 - 99 mg/dL 164(H) 159(H) 136(H)  BUN 8 - 23 mg/dL 34(H) 36(H) 35(H)  Creatinine 0.61 - 1.24 mg/dL 0.79 0.78 0.79  Sodium 135 - 145 mmol/L 150(H) 152(H) 148(H)  Potassium 3.5 - 5.1 mmol/L 3.3(L) 3.4(L) 3.6  Chloride 98 - 111 mmol/L 112(H) 113(H) 111  CO2 22 - 32 mmol/L 29 30 26   Calcium 8.9 - 10.3 mg/dL 8.2(L) 8.8(L) 8.4(L)  Total Protein 6.5 - 8.1 g/dL - - -  Total Bilirubin 0.3 - 1.2 mg/dL - - -  Alkaline Phos 38 - 126 U/L - - -  AST 15 - 41 U/L - - -  ALT 0 - 44 U/L - - -   CBC Latest Ref Rng & Units 05/16/2019 05/15/2019 05/03/2019  WBC 4.0 - 10.5 K/uL 9.1 13.0(H) 10.6(H)  Hemoglobin 13.0 - 17.0 g/dL 12.0(L) 12.4(L) 13.0  Hematocrit 39.0 - 52.0 % 38.9(L) 40.5 42.7  Platelets 150 - 400 K/uL 152 158 161     RADS: n/a Assessment:   Cachexia, malnourishment secondary to dysphagia. Incidental lung lesion. Discussed case with oncology and they recommend maximizing nutritional status first in case lung lesion requires additional treatment.  S/p open Jtube placement.  Tube feeds now at 65ml//hr.  Seems to be tolerating well.  Continue to advance as recommended to goal.  Monitor lytes.    I still recommend formal re-eval by speech to see if he can safely tolerate pleasure feeds, to keep him content.  Stage II pressure ulcer sacrum.  Keep padded.  PEG tube should be at 4cm mark on the skin.  The balloon should only be filled up to 2ml of fluid in order to prevent an obstruction.  Surgery will peripherally follow.  Plan is staple removal around 10days postop.  Please call if plan is possible transfer out of hospital before then.  Ok to leave incision uncovered to minimize tape to skin.  Dressing around jtube site as needed.  Monitor for skin breakdown and signs of malfunction, infection.    If placement will be an issue due to the continuous tube feeds, recommend discussing case with dietician to see if feedings can be converted to bolus feeds

## 2019-05-20 NOTE — Progress Notes (Signed)
PT Cancellation Note  Patient Details Name: Brian Ferguson MRN: [id number] DOB: [birth year]   Cancelled Treatment:    Reason Eval/Treat Not Completed: Fatigue/lethargy limiting ability to participate   Pt in bed, appears generally fatigued.  Refuses attempts at therapy and asking for water.  Explained NPO status but pt continues to ask for water "I had it 2 days ago."  Offered mouth swabs and moisture swabs were used to wet lips and mouth.  Pt voiced it was better but continued to ask for water.  Daughter called and assisted him with phone.     Chesley Noon 05/20/2019, 2:35 PM

## 2019-05-20 NOTE — Progress Notes (Signed)
PHARMACY CONSULT NOTE - FOLLOW UP  Pharmacy Consult for Electrolyte Monitoring and Replacement   Recent Labs: Potassium (mmol/L)  Date Value  05/19/2019 3.3 (L)  10/18/2011 4.0   Magnesium (mg/dL)  Date Value  05/19/2019 2.1   Calcium (mg/dL)  Date Value  05/19/2019 8.2 (L)   Calcium, Total (mg/dL)  Date Value  10/18/2011 9.1   Albumin (g/dL)  Date Value  04/19/2019 3.6   Phosphorus (mg/dL)  Date Value  05/19/2019 2.3 (L)   Sodium (mmol/L)  Date Value  05/19/2019 150 (H)  10/18/2011 138     Assessment: 85 year old male to restart tube feeds today. Pharmacy contacted by dietitian as patient is at risk of refeeding syndrome. MD consulted pharmacy to monitor electrolytes.  S/p PEG placement 7/29. Patient with one IV access.   Goal of Therapy:  Electrolytes WNL  Plan:  8/4 AM labs were not drawn.  Electrolytes have been ordered with morning labs.  Pharmacy to follow and replace electrolytes as indicated.  Paulina Fusi, PharmD, BCPS 05/20/2019 2:59 PM

## 2019-05-20 NOTE — Progress Notes (Signed)
Patient ID: Brian Ferguson, male   DOB: 23-Nov-1928, 85 y.o.   MRN: [id number]  [facility] PROGRESS NOTE  Brian Ferguson GGY:[id number] DOB: 1929/09/07 DOA: 05/02/2019 PCP: Olin Hauser, DO  HPI/Subjective: Patient today asking for water.  I explained that he is n.p.o. that we can give him water.  I explained that we are feeding him through the tube and via the IV right now.  Patient upset with this answer.  Objective: Vitals:   05/20/19 0442 05/20/19 1222  BP: 126/75 124/73  Pulse: 85 87  Resp: 18 (!) 22  Temp: 97.9 F (36.6 C) 97.7 F (36.5 C)  SpO2: 95% 93%    Filed Weights   05/17/19 1105 05/18/19 0514 05/19/19 0500  Weight: 55.3 kg 54.8 kg 54.7 kg    ROS: Review of Systems  Unable to perform ROS: Acuity of condition  Respiratory: Positive for cough. Negative for shortness of breath.   Gastrointestinal: Positive for abdominal pain.   Exam: Physical Exam  HENT:  Nose: No mucosal edema.  Mouth/Throat: No oropharyngeal exudate or posterior oropharyngeal edema.  Eyes: Pupils are equal, round, and reactive to light. Conjunctivae, EOM and lids are normal.  Neck: No JVD present. Carotid bruit is not present. No edema present. No thyroid mass and no thyromegaly present.  Cardiovascular: S1 normal and S2 normal. Exam reveals no gallop.  No murmur heard. Respiratory: No respiratory distress. He has decreased breath sounds in the right lower field and the left lower field. He has no wheezes. He has no rhonchi. He has no rales.  GI: Soft. Bowel sounds are normal. There is no abdominal tenderness.  Musculoskeletal:     Right ankle: He exhibits swelling.     Left ankle: He exhibits swelling.  Lymphadenopathy:    He has no cervical adenopathy.  Neurological: He is alert. No cranial nerve deficit.  Skin: Skin is warm. Nails show no clubbing.  Brownish discoloration right lower extremity, reddish discoloration left lower extremity.  Psychiatric: He has a normal mood  and affect.      Data Reviewed: Basic Metabolic Panel: Recent Labs  Lab 05/15/19 0531 05/16/19 0502 05/17/19 0557 05/18/19 0634 05/18/19 0635 05/19/19 0615  NA 146* 144 148*  --  152* 150*  K 3.8 3.3* 3.6  --  3.4* 3.3*  CL 112* 111 111  --  113* 112*  CO2 26 29 26   --  30 29  GLUCOSE 138* 132* 136*  --  159* 164*  BUN 27* 24* 35*  --  36* 34*  CREATININE 0.92 0.69 0.79  --  0.78 0.79  CALCIUM 7.9* 8.1* 8.4*  --  8.8* 8.2*  MG 1.9 2.1 2.1 2.1  --  2.1  PHOS 3.1 1.9* 3.3 2.2*  --  2.3*   CBC: Recent Labs  Lab 05/15/2019 2103 05/15/19 0531 05/16/19 0502  WBC 10.6* 13.0* 9.1  HGB 13.0 12.4* 12.0*  HCT 42.7 40.5 38.9*  MCV 97.9 97.4 98.5  PLT 161 158 152    CBG: Recent Labs  Lab 05/16/19 0740 05/17/19 1149 05/18/19 0746 05/19/19 0741 05/20/19 0752  GLUCAP 118* 131* 142* 147* 151*     Scheduled Meds: . bisacodyl  5 mg Oral Daily  . Chlorhexidine Gluconate Cloth  6 each Topical Q0600  . docusate  100 mg Per Tube BID  . enoxaparin (LOVENOX) injection  40 mg Subcutaneous Q24H  . feeding supplement (PRO-STAT SUGAR FREE 64)  30 mL Per Tube Daily  . free water  90  mL Per Tube Q3H  . lactulose  20 g Per Tube Once  . latanoprost  1 drop Both Eyes QHS  . mouth rinse  15 mL Mouth Rinse BID  . multivitamin  15 mL Per Tube Daily   Continuous Infusions: . ampicillin-sulbactam (UNASYN) IV 3 g (05/20/19 8182)  . dextrose 5 % with KCl 20 mEq / L 20 mEq (05/19/19 2019)  . feeding supplement (OSMOLITE 1.5 CAL) 30 mL/hr at 05/20/19 0322    Assessment/Plan:  1. Severe protein calorie malnutrition with cachexia weight loss and dysphasia.  Patient had J-tube placed by surgery.  Morning his tube feeds were at 30 cc/h.  Goal is to get up to 45 cc/h. 2. Acute hypoxic respiratory failure.  Aspiration pneumonia even though patient n.p.o.  On Unasyn.  Continue nasal cannula oxygen supplementation 3. Right upper lobe pulmonary lesion 4. Hypokalemia and hypophosphatemia.   Supplement with tube feeding, and potassium and IV fluids 5. Dehydration hypernatremia.  Continue D5 with KCl today for hypernatremia and hypokalemia. 6. Overall prognosis is poor.  Patient is a DNR 8.   Urinary retension.  Foley catheter placed.   Code Status:     Code Status Orders  (From admission, onward)         Start     Ordered   05/16/19 0944  Do not attempt resuscitation (DNR)  Continuous    Question Answer Comment  In the event of cardiac or respiratory ARREST Do not call a "code blue"   In the event of cardiac or respiratory ARREST Do not perform Intubation, CPR, defibrillation or ACLS   In the event of cardiac or respiratory ARREST Use medication by any route, position, wound care, and other measures to relive pain and suffering. May use oxygen, suction and manual treatment of airway obstruction as needed for comfort.      05/16/19 0943        Code Status History    Date Active Date Inactive Code Status Order ID Comments User Context   04/20/2019 1337 05/16/2019 0943 Full Code [id number]  Epifanio Lesches, MD Inpatient   Advance Care Planning Activity    Advance Directive Documentation     Most Recent Value  Type of Advance Directive  Healthcare Power of Attorney, Living will  Pre-existing out of facility DNR order (yellow form or pink MOST form)  -  "MOST" Form in Place?  -     Family Communication: Spoke with daughter [name] on the phone.  Disposition Plan: Social worker to look into facilities that can do continuous tube feeding.  Family did not want to proceed with hospice at this time.  Time spent: 28 minutes.  Aayliah Rotenberry [employer]

## 2019-05-21 ENCOUNTER — Inpatient Hospital Stay: Payer: Medicare Other

## 2019-05-21 LAB — BASIC METABOLIC PANEL
Anion gap: 9 (ref 5–15)
BUN: 19 mg/dL (ref 8–23)
CO2: 31 mmol/L (ref 22–32)
Calcium: 7.5 mg/dL — ABNORMAL LOW (ref 8.9–10.3)
Chloride: 108 mmol/L (ref 98–111)
Creatinine, Ser: 0.49 mg/dL — ABNORMAL LOW (ref 0.61–1.24)
GFR calc Af Amer: >60 mL/min (ref >60)
GFR calc non Af Amer: >60 mL/min (ref >60)
Glucose, Bld: 132 mg/dL — ABNORMAL HIGH (ref 70–99)
Potassium: 3.2 mmol/L — ABNORMAL LOW (ref 3.5–5.1)
Sodium: 148 mmol/L — ABNORMAL HIGH (ref 135–145)

## 2019-05-21 LAB — POTASSIUM: Potassium: 3.4 mmol/L — ABNORMAL LOW (ref 3.5–5.1)

## 2019-05-21 LAB — MAGNESIUM: Magnesium: 2 mg/dL (ref 1.7–2.4)

## 2019-05-21 LAB — GLUCOSE, CAPILLARY: Glucose-Capillary: 128 mg/dL — ABNORMAL HIGH (ref 70–99)

## 2019-05-21 LAB — PHOSPHORUS: Phosphorus: 1.7 mg/dL — ABNORMAL LOW (ref 2.5–4.6)

## 2019-05-21 MED ORDER — POTASSIUM PHOSPHATES 15 MMOLE/5ML IV SOLN
20.0000 mmol | Freq: Once | INTRAVENOUS | Status: AC
  Administered 2019-05-21: 20 mmol via INTRAVENOUS
  Filled 2019-05-21: qty 6.67

## 2019-05-21 MED ORDER — SODIUM CHLORIDE 0.9 % IV SOLN
INTRAVENOUS | Status: DC | PRN
  Administered 2019-05-21 – 2019-05-23 (×4): 250 mL via INTRAVENOUS

## 2019-05-21 MED ORDER — FUROSEMIDE 10 MG/ML IJ SOLN
40.0000 mg | Freq: Once | INTRAMUSCULAR | Status: AC
  Administered 2019-05-21: 12:00:00 40 mg via INTRAVENOUS
  Filled 2019-05-21: qty 4

## 2019-05-21 MED ORDER — LACTULOSE 10 GM/15ML PO SOLN: 20.0000 g | Freq: Every day | ORAL | Status: DC

## 2019-05-21 MED ORDER — POTASSIUM CHLORIDE 10 MEQ/100ML IV SOLN
10.0000 meq | INTRAVENOUS | Status: AC
  Administered 2019-05-21 (×2): 10 meq via INTRAVENOUS
  Filled 2019-05-21 (×2): qty 100

## 2019-05-21 MED ORDER — QUETIAPINE FUMARATE 25 MG PO TABS
12.5000 mg | ORAL_TABLET | Freq: Every day | ORAL | Status: DC
  Administered 2019-05-22: 12.5 mg via ORAL
  Filled 2019-05-21: qty 1

## 2019-05-21 MED ORDER — HYDROCODONE-ACETAMINOPHEN 5-325 MG PO TABS: 1.0000 | ORAL_TABLET | Freq: Four times a day (QID) | ORAL | Status: DC | PRN

## 2019-05-21 MED ORDER — POTASSIUM CHLORIDE 10 MEQ/100ML IV SOLN
10.0000 meq | INTRAVENOUS | Status: AC
  Administered 2019-05-21 (×2): 10 meq via INTRAVENOUS
  Filled 2019-05-21 (×2): qty 100

## 2019-05-21 NOTE — Progress Notes (Signed)
Patient ID: Beckam Abdulaziz, male   DOB: Mar 09, 1929, 85 y.o.   MRN: [id number]  Patient ID: Hannan Hutmacher, male   DOB: 26-Sep-1929, 85 y.o.   MRN: [id number]  [facility] PROGRESS NOTE  Alexandre Faries [id number]:[id number] DOB: Feb 18, 1929 DOA: 05/10/2019 PCP: Olin Hauser, DO  HPI/Subjective: Patient thought it was 12 midnight when it was 12 noon.  Patient answered yes when I asked him if he slept last night.  Patient did not complain of any shortness of breath or cough.  Nursing staff concerned about his respiratory status.  I stopped IV fluids and ordered a dose of Lasix.  Objective: Vitals:   05/21/19 0407 05/21/19 0634  BP: 120/69   Pulse: 87   Resp: 17   Temp: 99.6 F (37.6 C) 98.2 F (36.8 C)  SpO2: 92%     Filed Weights   05/18/19 0514 05/19/19 0500 05/21/19 0500  Weight: 54.8 kg 54.7 kg 52.5 kg    ROS: Review of Systems  Unable to perform ROS: Acuity of condition  Respiratory: Negative for cough and shortness of breath.   Gastrointestinal: Negative for abdominal pain.   Exam: Physical Exam  HENT:  Nose: No mucosal edema.  Mouth/Throat: No oropharyngeal exudate or posterior oropharyngeal edema.  Eyes: Pupils are equal, round, and reactive to light. Conjunctivae, EOM and lids are normal.  Neck: No JVD present. Carotid bruit is not present. No edema present. No thyroid mass and no thyromegaly present.  Cardiovascular: S1 normal and S2 normal. Exam reveals no gallop.  No murmur heard. Respiratory: No respiratory distress. He has decreased breath sounds in the right lower field and the left lower field. He has no wheezes. He has no rhonchi. He has no rales.  GI: Soft. Bowel sounds are normal. There is no abdominal tenderness.  Musculoskeletal:     Right ankle: He exhibits swelling.     Left ankle: He exhibits swelling.  Lymphadenopathy:    He has no cervical adenopathy.  Neurological: He is alert. No cranial nerve deficit.  Skin: Skin is warm. Nails show no  clubbing.  Brownish discoloration right lower extremity, reddish discoloration left lower extremity.  Psychiatric: He has a normal mood and affect.      Data Reviewed: Basic Metabolic Panel: Recent Labs  Lab 05/16/19 0502 05/17/19 0557 05/18/19 9381 05/18/19 0175 05/19/19 0615 05/20/19 1730 05/21/19 0841  NA 144 148*  --  152* 150*  --  148*  K 3.3* 3.6  --  3.4* 3.3*  --  3.2*  CL 111 111  --  113* 112*  --  108  CO2 29 26  --  30 29  --  31  GLUCOSE 132* 136*  --  159* 164*  --  132*  BUN 24* 35*  --  36* 34*  --  19  CREATININE 0.69 0.79  --  0.78 0.79  --  0.49*  CALCIUM 8.1* 8.4*  --  8.8* 8.2*  --  7.5*  MG 2.1 2.1 2.1  --  2.1 2.1 2.0  PHOS 1.9* 3.3 2.2*  --  2.3* 2.1* 1.7*   CBC: Recent Labs  Lab 04/26/2019 2103 05/15/19 0531 05/16/19 0502  WBC 10.6* 13.0* 9.1  HGB 13.0 12.4* 12.0*  HCT 42.7 40.5 38.9*  MCV 97.9 97.4 98.5  PLT 161 158 152    CBG: Recent Labs  Lab 05/17/19 1149 05/18/19 0746 05/19/19 0741 05/20/19 0752 05/21/19 0742  GLUCAP 131* 142* 147* 151* 128*     Scheduled Meds: .  Chlorhexidine Gluconate Cloth  6 each Topical Q0600  . docusate  100 mg Per Tube BID  . enoxaparin (LOVENOX) injection  40 mg Subcutaneous Q24H  . feeding supplement (PRO-STAT SUGAR FREE 64)  30 mL Per Tube Daily  . free water  90 mL Per Tube Q3H  . lactulose  20 g Per Tube Daily  . latanoprost  1 drop Both Eyes QHS  . mouth rinse  15 mL Mouth Rinse BID  . multivitamin  15 mL Per Tube Daily  . QUEtiapine  12.5 mg Oral QHS   Continuous Infusions: . ampicillin-sulbactam (UNASYN) IV Stopped (05/21/19 0540)  . feeding supplement (OSMOLITE 1.5 CAL) 1,000 mL (05/20/19 2227)  . potassium PHOSPHATE IVPB (in mmol)      Assessment/Plan:  1. Acute respiratory failure and aspiration pneumonia.  Repeat chest x-ray today.  Stopped IV fluids and gave a dose of Lasix.  Continue Unasyn.   2. Acute delirium.  Give a dose of Seroquel this evening to try to reset the  patient's time clock. 3. Severe protein calorie malnutrition with cachexia weight loss and dysphasia.  Patient had J-tube placed by surgery.  Feeding up to goal rate. 4. Right upper lobe pulmonary lesion 5. Hypokalemia and hypophosphatemia.  K-Phos being given 6. Dehydration hypernatremia.  We will have to give another free water via the tube at this point in order to maintain hydration 7. Overall prognosis is poor.  Patient is a DNR 7.   Urinary retension.  Foley catheter placed.     Code Status:     Code Status Orders  (From admission, onward)         Start     Ordered   05/16/19 0944  Do not attempt resuscitation (DNR)  Continuous    Question Answer Comment  In the event of cardiac or respiratory ARREST Do not call a "code blue"   In the event of cardiac or respiratory ARREST Do not perform Intubation, CPR, defibrillation or ACLS   In the event of cardiac or respiratory ARREST Use medication by any route, position, wound care, and other measures to relive pain and suffering. May use oxygen, suction and manual treatment of airway obstruction as needed for comfort.      05/16/19 0943        Code Status History    Date Active Date Inactive Code Status Order ID Comments User Context   04/20/2019 1337 05/16/2019 0943 Full Code [id number]  Epifanio Lesches, MD Inpatient   Advance Care Planning Activity    Advance Directive Documentation     Most Recent Value  Type of Advance Directive  Healthcare Power of Attorney, Living will  Pre-existing out of facility DNR order (yellow form or pink MOST form)  -  "MOST" Form in Place?  -     Family Communication: Spoke with daughter [name] on the phone.  Disposition Plan: [facility] can take the patient with continuous feeding.  In speaking with the daughter [name] that if the patient declines then can be converted over to hospice.  Time spent: 30 minutes.  Morgan Rennert [employer]

## 2019-05-21 NOTE — Progress Notes (Signed)
PHARMACY CONSULT NOTE - FOLLOW UP  Pharmacy Consult for Electrolyte Monitoring and Replacement   Recent Labs: Potassium (mmol/L)  Date Value  05/21/2019 3.2 (L)  10/18/2011 4.0   Magnesium (mg/dL)  Date Value  05/21/2019 2.0   Calcium (mg/dL)  Date Value  05/21/2019 7.5 (L)   Calcium, Total (mg/dL)  Date Value  10/18/2011 9.1   Albumin (g/dL)  Date Value  05/14/2019 3.6   Phosphorus (mg/dL)  Date Value  05/21/2019 1.7 (L)   Sodium (mmol/L)  Date Value  05/21/2019 148 (H)  10/18/2011 138     Assessment: 85 year old male to restart tube feeds today. Pharmacy contacted by dietitian as patient is at risk of refeeding syndrome. MD consulted pharmacy to monitor electrolytes.  S/p PEG placement 7/29. Patient with one IV access.   Goal of Therapy:  Electrolytes WNL  Plan:  Continue IVF D5 w 20 meq KCL @ 34ml/hr  Will give potassium phosphate 20 mmol IV x 1  Will give KCL IV 52meq x 2  Will recheck potassium @ 1800 per protocol  Electrolytes (sans magnesium) have been ordered with morning labs.  Pharmacy to follow and replace electrolytes as indicated.  Lu Duffel, PharmD, BCPS Clinical Pharmacist 05/21/2019 10:00 AM

## 2019-05-21 NOTE — Progress Notes (Signed)
PHARMACY CONSULT NOTE - FOLLOW UP  Pharmacy Consult for Electrolyte Monitoring and Replacement   Recent Labs: Potassium (mmol/L)  Date Value  05/21/2019 3.4 (L)  10/18/2011 4.0   Magnesium (mg/dL)  Date Value  05/21/2019 2.0   Calcium (mg/dL)  Date Value  05/21/2019 7.5 (L)   Calcium, Total (mg/dL)  Date Value  10/18/2011 9.1   Albumin (g/dL)  Date Value  05/16/2019 3.6   Phosphorus (mg/dL)  Date Value  05/21/2019 1.7 (L)   Sodium (mmol/L)  Date Value  05/21/2019 148 (H)  10/18/2011 138     Assessment: 85 year old male to restart tube feeds today. Pharmacy contacted by dietitian as patient is at risk of refeeding syndrome. MD consulted pharmacy to monitor electrolytes.  S/p PEG placement 7/29. Patient with one IV access.   Goal of Therapy:  Electrolytes WNL  Plan:   IVF D5 w 20 meq KCL @ 11ml/hr - d/c'ed  Will give potassium phosphate 20 mmol IV x 1  Pt NPO. Will give KCL IV 23meq x 2  Will recheck potassium with AM labs.   Electrolytes (sans magnesium) have been ordered with morning labs.  Pharmacy to follow and replace electrolytes as indicated.  Oswald Hillock, PharmD, BCPS Clinical Pharmacist 05/21/2019 7:55 PM

## 2019-05-22 LAB — BASIC METABOLIC PANEL
Anion gap: 10 (ref 5–15)
BUN: 21 mg/dL (ref 8–23)
CO2: 32 mmol/L (ref 22–32)
Calcium: 7.6 mg/dL — ABNORMAL LOW (ref 8.9–10.3)
Chloride: 105 mmol/L (ref 98–111)
Creatinine, Ser: 0.67 mg/dL (ref 0.61–1.24)
GFR calc Af Amer: >60 mL/min (ref >60)
GFR calc non Af Amer: >60 mL/min (ref >60)
Glucose, Bld: 112 mg/dL — ABNORMAL HIGH (ref 70–99)
Potassium: 3 mmol/L — ABNORMAL LOW (ref 3.5–5.1)
Sodium: 147 mmol/L — ABNORMAL HIGH (ref 135–145)

## 2019-05-22 LAB — GLUCOSE, CAPILLARY: Glucose-Capillary: 96 mg/dL (ref 70–99)

## 2019-05-22 LAB — MAGNESIUM: Magnesium: 1.9 mg/dL (ref 1.7–2.4)

## 2019-05-22 LAB — PHOSPHORUS: Phosphorus: 2.1 mg/dL — ABNORMAL LOW (ref 2.5–4.6)

## 2019-05-22 MED ORDER — POTASSIUM PHOSPHATES 15 MMOLE/5ML IV SOLN
20.0000 mmol | Freq: Once | INTRAVENOUS | Status: AC
  Administered 2019-05-22: 20 mmol via INTRAVENOUS
  Filled 2019-05-22: qty 6.67

## 2019-05-22 MED ORDER — POTASSIUM CHLORIDE 20 MEQ PO PACK
40.0000 meq | PACK | Freq: Two times a day (BID) | ORAL | Status: DC
  Administered 2019-05-22 (×2): 40 meq via JEJUNOSTOMY
  Filled 2019-05-22 (×2): qty 2

## 2019-05-22 NOTE — Progress Notes (Signed)
Patient ID: Suhayb Anzalone, male   DOB: 1928/11/25, 85 y.o.   MRN: [id number]  [facility] PROGRESS NOTE  Render Marley ZDG:[id number] DOB: 1929/03/03 DOA: 05/16/2019 PCP: Olin Hauser, DO  HPI/Subjective: Patient asking to get out of the hospital.  Patient asking for yogurt.  No complaints of shortness of breath or cough.  Patient had a rough night with going on high flow nasal cannula and his tube feeds being held.  Now back to regular nasal cannula at this point.  Objective: Vitals:   05/22/19 0535 05/22/19 0815  BP: (!) 119/58   Pulse: 98   Resp:    Temp: 98.5 F (36.9 C)   SpO2: 90% 95%    Filed Weights   05/19/19 0500 05/21/19 0500 05/22/19 0500  Weight: 54.7 kg 52.5 kg 56.2 kg    ROS: Review of Systems  Unable to perform ROS: Acuity of condition  Respiratory: Negative for cough and shortness of breath.   Gastrointestinal: Negative for abdominal pain.   Exam: Physical Exam  HENT:  Nose: No mucosal edema.  Mouth/Throat: No oropharyngeal exudate or posterior oropharyngeal edema.  Eyes: Pupils are equal, round, and reactive to light. Conjunctivae, EOM and lids are normal.  Neck: No JVD present. Carotid bruit is not present. No edema present. No thyroid mass and no thyromegaly present.  Cardiovascular: S1 normal and S2 normal. Exam reveals no gallop.  No murmur heard. Respiratory: No respiratory distress. He has decreased breath sounds in the right lower field and the left lower field. He has no wheezes. He has no rhonchi. He has no rales.  GI: Soft. Bowel sounds are normal. There is no abdominal tenderness.  Musculoskeletal:     Right ankle: He exhibits swelling.     Left ankle: He exhibits swelling.  Lymphadenopathy:    He has no cervical adenopathy.  Neurological: He is alert. No cranial nerve deficit.  Skin: Skin is warm. Nails show no clubbing.  Brownish discoloration right lower extremity, reddish discoloration left lower extremity.  Psychiatric:  He has a normal mood and affect.      Data Reviewed: Basic Metabolic Panel: Recent Labs  Lab 05/17/19 0557 05/18/19 5956 05/18/19 3875 05/19/19 0615 05/20/19 1730 05/21/19 0841 05/21/19 1818 05/22/19 0513  NA 148*  --  152* 150*  --  148*  --  147*  K 3.6  --  3.4* 3.3*  --  3.2* 3.4* 3.0*  CL 111  --  113* 112*  --  108  --  105  CO2 26  --  30 29  --  31  --  32  GLUCOSE 136*  --  159* 164*  --  132*  --  112*  BUN 35*  --  36* 34*  --  19  --  21  CREATININE 0.79  --  0.78 0.79  --  0.49*  --  0.67  CALCIUM 8.4*  --  8.8* 8.2*  --  7.5*  --  7.6*  MG 2.1 2.1  --  2.1 2.1 2.0  --  1.9  PHOS 3.3 2.2*  --  2.3* 2.1* 1.7*  --  2.1*   CBC: Recent Labs  Lab 05/16/19 0502  WBC 9.1  HGB 12.0*  HCT 38.9*  MCV 98.5  PLT 152    CBG: Recent Labs  Lab 05/18/19 0746 05/19/19 0741 05/20/19 0752 05/21/19 0742 05/22/19 0905  GLUCAP 142* 147* 151* 128* 96     Scheduled Meds: . Chlorhexidine Gluconate Cloth  6  each Topical [id number]  . enoxaparin (LOVENOX) injection  40 mg Subcutaneous Q24H  . feeding supplement (PRO-STAT SUGAR FREE 64)  30 mL Per Tube Daily  . free water  90 mL Per Tube Q3H  . latanoprost  1 drop Both Eyes QHS  . mouth rinse  15 mL Mouth Rinse BID  . multivitamin  15 mL Per Tube Daily  . potassium chloride  40 mEq Per J Tube BID  . QUEtiapine  12.5 mg Oral QHS   Continuous Infusions: . sodium chloride 250 mL (05/22/19 0532)  . ampicillin-sulbactam (UNASYN) IV 3 g (05/22/19 0533)  . feeding supplement (OSMOLITE 1.5 CAL) 1,000 mL (05/22/19 1030)  . potassium PHOSPHATE IVPB (in mmol) 20 mmol (05/22/19 1035)    Assessment/Plan:  1. Acute respiratory failure and aspiration pneumonia.  Repeat chest x-ray showed worsening pneumonia.  Continue Unasyn.  Patient required high flow nasal cannula last night and now tapered back to regular nasal cannula 6 L. 2. Acute delirium.  Seems to be a little bit better today with Seroquel given last night. 3. Severe  protein calorie malnutrition with cachexia weight loss and dysphasia.  Patient had J-tube placed by general surgery.  Tube feedings held last night.  Restart tube feeding today. 4. Right upper lobe pulmonary lesion 5. Hypokalemia and hypophosphatemia.  K-Phos as per pharmacy 6. Dehydration hypernatremia.  We will have to be given with the free water at this point. 7. Overall prognosis is poor.  Patient is a DNR.  Spoke with [name] the patient's daughter about the potential for hospice.  She will see the patient tomorrow and her sister will see the patient today and they will contemplate this decision. 7.   Urinary retension.  Foley catheter placed.     Code Status:     Code Status Orders  (From admission, onward)         Start     Ordered   05/16/19 0944  Do not attempt resuscitation (DNR)  Continuous    Question Answer Comment  In the event of cardiac or respiratory ARREST Do not call a "code blue"   In the event of cardiac or respiratory ARREST Do not perform Intubation, CPR, defibrillation or ACLS   In the event of cardiac or respiratory ARREST Use medication by any route, position, wound care, and other measures to relive pain and suffering. May use oxygen, suction and manual treatment of airway obstruction as needed for comfort.      05/16/19 0943        Code Status History    Date Active Date Inactive Code Status Order ID Comments User Context   04/26/2019 1337 05/16/2019 0943 Full Code [id number]  Epifanio Lesches, MD Inpatient   Advance Care Planning Activity    Advance Directive Documentation     Most Recent Value  Type of Advance Directive  Healthcare Power of Attorney, Living will  Pre-existing out of facility DNR order (yellow form or pink MOST form)  -  "MOST" Form in Place?  -     Family Communication: Spoke with daughter [name] on the phone.  Disposition Plan: Family to see patient today and tomorrow and then decide on whether to switch their plan from rehab  over to hospice.  Time spent: 28 minutes, including ACP time.  Sagar Tengan [employer]

## 2019-05-22 NOTE — Progress Notes (Signed)
Patient oxygen level 86% on 5L, increased rate to 6L. MD Jannifer Franklin notified, with orders for a high flow nasal cannula. 92% on HFNC on 10L. Patient had 7 bowel movements before midnight, MD notified. Patient had received many medications for constipation, but received orders to stop feedings. Stopped feedings at [place]. Patient resting with sats in the low 90's. Will continue to monitor.  Stacie Glaze, RN

## 2019-05-22 NOTE — Progress Notes (Signed)
Patient ID: Brian Ferguson, male   DOB: 03/14/1929, 85 y.o.   MRN: [id number]  ACP note  Patient cannot participate in this conversation Spoke with patient's daughter [name]  Patient had worsening respiratory status last night requiring high flow nasal cannula.  Chest x-ray showing worsening aspiration pneumonia despite the patient being n.p.o.  The patient's tube feeds were held last night.  We will try to restart tube feeds today to see if he will tolerate.  Patient's acute delirium seems a little bit better today.  The patient is malnourished and has severe protein calorie malnutrition.  The patient still has electrolyte abnormalities despite being on tube feeding.  I do not believe that the patient will do well at rehab.  The patient's daughter [name] will come and visit the patient today.  The patient's daughter [name] will visit tomorrow.  They will talk and contemplate the decision of hospice versus rehab.  Hopefully will come to a decision tomorrow.  Patient already a DO NOT RESUSCITATE.  I do not believe the patient will survive much longer regardless of what we do.  Time spent on ACP discussion 17 minutes Dr Loletha Grayer

## 2019-05-22 NOTE — Progress Notes (Signed)
Physical Therapy Treatment Patient Details Name: Brian Ferguson MRN: [id number] DOB: 1929-09-19 Today's Date: 05/22/2019    History of Present Illness  85 y.o. male with a known history of gastric cancer in 1987 sent in by Dr. Bonna Gains for IV fluid. Has been having progressive weight loss for last 6 to 7 months.  Pt had NG tube placed 7/24 and he pulled it out later that day, now is s/p PEG tube 7/29.    PT Comments    Pt is making limited progress towards goals demonstrating increased weakness this session. Needs assist for all mobility including there-ex. O2 sats at 94% on 6L of O2. Pt very cachexic, may benefit from pressure relief bed in addition to pressure relief boots for B feet. Not able to perform OOB mobility secondary to severe weakness. Will continue to progress as able.  Follow Up Recommendations  SNF     Equipment Recommendations  None recommended by PT    Recommendations for Other Services       Precautions / Restrictions Precautions Precautions: Fall Restrictions Weight Bearing Restrictions: No    Mobility  Bed Mobility Overal bed mobility: Needs Assistance Bed Mobility: Rolling Rolling: Total assist         General bed mobility comments: needs total assist for rolling to B sides. Heavy VC for sequencing and little effort performed from pt. Unable to further progress  Transfers                    Ambulation/Gait                 Stairs             Wheelchair Mobility    Modified Rankin (Stroke Patients Only)       Balance                                            Cognition Arousal/Alertness: Awake/alert Behavior During Therapy: WFL for tasks assessed/performed Overall Cognitive Status: Within Functional Limits for tasks assessed                                        Exercises Other Exercises Other Exercises: supine ther-ex performed including B rolling x 5 reps, AP, quad sets,  SLRs, SAQ, hip abd/add, and hip add squeezes x 10 reps with max assist on B LE. Also performed B UE elbow flexion/extension with mod assist. Need cues for sequencing.    General Comments        Pertinent Vitals/Pain Pain Assessment: No/denies pain    Home Living                      Prior Function            PT Goals (current goals can now be found in the care plan section) Acute Rehab PT Goals Patient Stated Goal: get stronger and get back to exercising at home PT Goal Formulation: With patient Time For Goal Achievement: 06/05/19 Potential to Achieve Goals: Fair Progress towards PT goals: Progressing toward goals    Frequency    Min 2X/week      PT Plan Current plan remains appropriate    Co-evaluation  AM-PAC PT "6 Clicks" Mobility   Outcome Measure  Help needed turning from your back to your side while in a flat bed without using bedrails?: Total Help needed moving from lying on your back to sitting on the side of a flat bed without using bedrails?: Total Help needed moving to and from a bed to a chair (including a wheelchair)?: Total Help needed standing up from a chair using your arms (e.g., wheelchair or bedside chair)?: Total Help needed to walk in hospital room?: Total Help needed climbing 3-5 steps with a railing? : Total 6 Click Score: 6    End of Session Equipment Utilized During Treatment: Oxygen Activity Tolerance: Patient tolerated treatment well Patient left: with bed alarm set;with call bell/phone within reach Nurse Communication: Mobility status PT Visit Diagnosis: Muscle weakness (generalized) (M62.81);Difficulty in walking, not elsewhere classified (R26.2)     Time: 8329-1916 PT Time Calculation (min) (ACUTE ONLY): 24 min  Charges:  $Therapeutic Exercise: 23-37 mins                     Greggory Stallion, PT, DPT [phone]    Hazelene Doten 05/22/2019, 12:02 PM

## 2019-05-22 NOTE — Progress Notes (Addendum)
PHARMACY CONSULT NOTE - FOLLOW UP  Pharmacy Consult for Electrolyte Monitoring and Replacement   Recent Labs: Potassium (mmol/L)  Date Value  05/22/2019 3.0 (L)  10/18/2011 4.0   Magnesium (mg/dL)  Date Value  05/22/2019 1.9   Calcium (mg/dL)  Date Value  05/22/2019 7.6 (L)   Calcium, Total (mg/dL)  Date Value  10/18/2011 9.1   Albumin (g/dL)  Date Value  04/28/2019 3.6   Phosphorus (mg/dL)  Date Value  05/22/2019 2.1 (L)   Sodium (mmol/L)  Date Value  05/22/2019 147 (H)  10/18/2011 138     Assessment: 84 year old male on tube feeds.   Pharmacy contacted by dietitian as patient is at risk of refeeding syndrome. MD consulted pharmacy to monitor electrolytes.  S/p PEG placement 7/29. Patient with one IV access.   Goal of Therapy:  Electrolytes WNL  Plan:   IVF D5 w 20 meq KCL @ 11ml/hr - d/c'ed  Will give potassium phosphate 20 mmol IV x 1  Pt NPO. Physician placed order for KCl 16meq per tube bid   Electrolytes have been ordered with morning labs.  Pharmacy to follow and replace electrolytes as indicated.  Lu Duffel, PharmD, BCPS Clinical Pharmacist 05/22/2019 7:23 AM

## 2019-05-22 NOTE — TOC Progression Note (Signed)
Transition of Care [facility]) - Progression Note    Patient Details  Name: Brian Ferguson MRN: [id number] Date of Birth: 1929-02-20  [facility] CM/SW Contact  Shelbie Hutching, RN Phone Number: 05/22/2019, 4:27 PM  Clinical Narrative:    Patient's daughter's are going to decide on plan of care tomorrow.  Daughter's are leaning toward residential hospice.  Patient verbalized to [facility] today that he will not go to [facility].  Daughter Jeanie Cooks is coming tomorrow, she is the [place] and other daughter [name] will make decision tomorrow.     Expected Discharge Plan: [place] Barriers to Discharge: Continued Medical Work up  Expected Discharge Plan and Services Expected Discharge Plan: [place]   Discharge Planning Services: CM Consult   Living arrangements for the past 2 months: Single Family Home                                       Social Determinants of Health (SDOH) Interventions    Readmission Risk Interventions No flowsheet data found.

## 2019-05-22 NOTE — Care Management Important Message (Signed)
Important Message  Patient Details  Name: Brian Ferguson MRN: [id number] Date of Birth: 08/04/1929   Medicare Important Message Given:  Yes     Juliann Pulse A Anaelle Dunton 05/22/2019, 11:41 AM

## 2019-05-23 LAB — BASIC METABOLIC PANEL
Anion gap: 7 (ref 5–15)
BUN: 21 mg/dL (ref 8–23)
CO2: 31 mmol/L (ref 22–32)
Calcium: 7.8 mg/dL — ABNORMAL LOW (ref 8.9–10.3)
Chloride: 107 mmol/L (ref 98–111)
Creatinine, Ser: 0.58 mg/dL — ABNORMAL LOW (ref 0.61–1.24)
GFR calc Af Amer: >60 mL/min (ref >60)
GFR calc non Af Amer: >60 mL/min (ref >60)
Glucose, Bld: 139 mg/dL — ABNORMAL HIGH (ref 70–99)
Potassium: 3.8 mmol/L (ref 3.5–5.1)
Sodium: 145 mmol/L (ref 135–145)

## 2019-05-23 LAB — PHOSPHORUS: Phosphorus: 2.1 mg/dL — ABNORMAL LOW (ref 2.5–4.6)

## 2019-05-23 LAB — GLUCOSE, CAPILLARY: Glucose-Capillary: 124 mg/dL — ABNORMAL HIGH (ref 70–99)

## 2019-05-23 LAB — MAGNESIUM: Magnesium: 2.1 mg/dL (ref 1.7–2.4)

## 2019-05-23 MED ORDER — POTASSIUM PHOSPHATES 15 MMOLE/5ML IV SOLN
20.0000 mmol | Freq: Once | INTRAVENOUS | Status: AC
  Administered 2019-05-23: 10:00:00 20 mmol via INTRAVENOUS
  Filled 2019-05-23: qty 6.67

## 2019-05-23 MED ORDER — MORPHINE SULFATE (CONCENTRATE) 10 MG /0.5 ML PO SOLN: 5.0000 mg | ORAL | 0 refills | Status: AC | PRN

## 2019-05-23 MED ORDER — GLYCOPYRROLATE 0.2 MG/ML IJ SOLN
0.2000 mg | INTRAMUSCULAR | Status: DC | PRN
  Filled 2019-05-23: qty 1

## 2019-05-23 MED ORDER — MORPHINE SULFATE (PF) 2 MG/ML IV SOLN
2.0000 mg | INTRAVENOUS | Status: DC | PRN
  Administered 2019-05-24 – 2019-05-25 (×2): 2 mg via INTRAVENOUS
  Filled 2019-05-23 (×2): qty 1

## 2019-05-23 MED ORDER — POTASSIUM CHLORIDE 20 MEQ PO PACK
20.0000 meq | PACK | Freq: Two times a day (BID) | ORAL | Status: DC
  Administered 2019-05-23: 20 meq via JEJUNOSTOMY
  Filled 2019-05-23: qty 1

## 2019-05-23 MED ORDER — GLYCOPYRROLATE 1 MG PO TABS
1.0000 mg | ORAL_TABLET | ORAL | Status: DC | PRN
  Filled 2019-05-23: qty 1

## 2019-05-23 MED ORDER — GLYCOPYRROLATE 0.2 MG/ML IJ SOLN
0.2000 mg | INTRAMUSCULAR | Status: DC | PRN
  Administered 2019-05-23: 21:00:00 0.2 mg via INTRAVENOUS
  Filled 2019-05-23 (×2): qty 1

## 2019-05-23 NOTE — Progress Notes (Signed)
PHARMACY CONSULT NOTE - FOLLOW UP  Pharmacy Consult for Electrolyte Monitoring and Replacement   Recent Labs: Potassium (mmol/L)  Date Value  05/23/2019 3.8  10/18/2011 4.0   Magnesium (mg/dL)  Date Value  05/23/2019 2.1   Calcium (mg/dL)  Date Value  05/23/2019 7.8 (L)   Calcium, Total (mg/dL)  Date Value  10/18/2011 9.1   Albumin (g/dL)  Date Value  04/26/2019 3.6   Phosphorus (mg/dL)  Date Value  05/23/2019 2.1 (L)   Sodium (mmol/L)  Date Value  05/23/2019 145  10/18/2011 138     Assessment: 85 year old male on tube feeds.   Pharmacy contacted by dietitian as patient is at risk of refeeding syndrome. MD consulted pharmacy to monitor electrolytes.  S/p PEG placement 7/29. Patient with one IV access.   K = 3.8, Phos = 2.1, Mg = 2.1  Patient NPO  Goal of Therapy:  Electrolytes WNL  Plan:  Will give potassium phosphate 20 mmol IV x 1  Will reduce daily potassium replenishment from 59meq bid to 20 meq bid per tube  Electrolytes have been ordered with morning labs.  Pharmacy to follow and replace electrolytes as indicated.  Lu Duffel, PharmD, BCPS Clinical Pharmacist 05/23/2019 7:27 AM

## 2019-05-23 NOTE — Discharge Summary (Addendum)
[place] [facility] [place] NAME: Brian Ferguson    MR#:  [id number]  DATE OF BIRTH:  January 13, 1929  DATE OF ADMISSION:  05/11/2019 ADMITTING PHYSICIAN: Epifanio Lesches, MD  DATE OF DISCHARGE: 05/24/2019  PRIMARY CARE PHYSICIAN: Olin Hauser, DO    ADMISSION DIAGNOSIS:  Weight loss dehydration dysphagia  DISCHARGE DIAGNOSIS:  Active Problems:   History of gastric cancer   Malnutrition (HCC)   Cachexia (HCC)   Dysphagia   Lung mass   Palliative care encounter   Protein-calorie malnutrition, severe   Pressure injury of skin   Aspiration pneumonia ([place])   SECONDARY DIAGNOSIS:   Past Medical History:  Diagnosis Date  . Cancer ([place]) 1987   stomach. 2/3 of stomach removed  . Confusion    alert and oriented at times  . Coronary artery disease   . Glaucoma   . History of blood clots   . Malabsorption syndrome     HOSPITAL COURSE:  85 year old male with a remote history of gastric cancer who was initially sent from GI office due to adult failure to thrive with plans for surgical feeding tube.  1.  Acute hypoxic respiratory failure with aspiration pneumonia: Patient has been treated for 7 days of antibiotics for aspiration pneumonia. O2 PRN  2.  Acute encephalopathy/delirium from prolonged hospitalization:   3.  Severe protein calorie malnutrition with cachexia, weight loss and dysphasia: J-tube has been placed by general surgery.  4.  Incidental finding of right upper lobe pulmonary lesion: No further work-up at this time  5.  Electrolyte abnormalities: These were being repleted by pharmacy PRN.  6.  Urinary retention: Continue Foley catheter  Patient is critically ill.  He has very poor prognosis. HE needs Hospice and family in agreement  DISCHARGE CONDITIONS AND DIET:  Guarded/poor Diet as tolerated for comfort  CONSULTS OBTAINED:  Treatment Team:  Benjamine Sprague, DO  DRUG ALLERGIES:  No Known  Allergies  DISCHARGE MEDICATIONS:   Allergies as of 05/23/2019   No Known Allergies     Medication List    STOP taking these medications   latanoprost 0.005 % ophthalmic solution Commonly known as: XALATAN   vitamin C 500 MG tablet Commonly known as: ASCORBIC ACID     TAKE these medications   morphine CONCENTRATE 10 mg / 0.5 ml concentrated solution Take 0.25 mLs (5 mg total) by mouth every 2 (two) hours as needed for severe pain.         Today   CHIEF COMPLAINT:   Get me out of here   VITAL SIGNS:  Blood pressure 112/63, pulse 90, temperature 97.6 F (36.4 C), temperature source Oral, resp. rate 17, height 5\' 10"  (1.778 m), weight 55.5 kg, SpO2 92 %.   REVIEW OF SYSTEMS:  Review of Systems  Unable to perform ROS: Acuity of condition     PHYSICAL EXAMINATION:  GENERAL:  85 y.o.-year-old patient   DATA REVIEW:   CBC No results for input(s): WBC, HGB, HCT, PLT in the last 168 hours.  Chemistries  Recent Labs  Lab 05/23/19 0454  NA 145  K 3.8  CL 107  CO2 31  GLUCOSE 139*  BUN 21  CREATININE 0.58*  CALCIUM 7.8*  MG 2.1    Cardiac Enzymes No results for input(s): TROPONINI in the last 168 hours.  Microbiology Results  @MICRORSLT48 @  RADIOLOGY:  Dg Chest Port 1 View  Result Date: 05/21/2019 CLINICAL DATA:  Productive cough, history of stomach cancer,  ex smoker EXAM: PORTABLE CHEST 1 VIEW COMPARISON:  05/16/2019 FINDINGS: Slight interval increase in heterogeneous airspace opacity of the perihilar and lower left lung. Unchanged elevation of the right hemidiaphragm with bandlike scarring or atelectasis. The heart and mediastinum are unremarkable. IMPRESSION: Slight interval increase in heterogeneous airspace opacity of the perihilar and lower left lung. Unchanged elevation of the right hemidiaphragm with bandlike scarring or atelectasis. The heart and mediastinum are unremarkable. Electronically Signed   By: Eddie Candle M.D.   On: 05/21/2019 15:25       Allergies as of 05/23/2019   No Known Allergies     Medication List    STOP taking these medications   latanoprost 0.005 % ophthalmic solution Commonly known as: XALATAN   vitamin C 500 MG tablet Commonly known as: ASCORBIC ACID     TAKE these medications   morphine CONCENTRATE 10 mg / 0.5 ml concentrated solution Take 0.25 mLs (5 mg total) by mouth every 2 (two) hours as needed for severe pain.          Management plans discussed with the patient's daughter and she is in agreement. Stable for discharge   Patient should follow up with hospice  CODE STATUS:     Code Status Orders  (From admission, onward)         Start     Ordered   05/16/19 0944  Do not attempt resuscitation (DNR)  Continuous    Question Answer Comment  In the event of cardiac or respiratory ARREST Do not call a "code blue"   In the event of cardiac or respiratory ARREST Do not perform Intubation, CPR, defibrillation or ACLS   In the event of cardiac or respiratory ARREST Use medication by any route, position, wound care, and other measures to relive pain and suffering. May use oxygen, suction and manual treatment of airway obstruction as needed for comfort.      05/16/19 0943        Code Status History    Date Active Date Inactive Code Status Order ID Comments User Context   05/03/2019 1337 05/16/2019 0943 Full Code [id number]  Epifanio Lesches, MD Inpatient   Advance Care Planning Activity    Advance Directive Documentation     Most Recent Value  Type of Advance Directive  Healthcare Power of [place], Living will  Pre-existing out of facility DNR order (yellow form or pink MOST form)  -  "MOST" Form in Place?  -      TOTAL TIME TAKING CARE OF THIS PATIENT: 37 minutes.    Note: This dictation was prepared with Dragon dictation along with smaller phrase technology. Any transcriptional errors that result from this process are unintentional.  Bettey Costa M.D on 05/23/2019 at 11:22  AM  Between 7am to 6pm - Pager - [phone] After 6pm go to [email or web address] - password [email or web address] [facility]  Office  [phone]  CC: Primary care physician; Olin Hauser, DO

## 2019-05-23 NOTE — Progress Notes (Signed)
New referral for inpatient hospice received from [facility].  Informed Josh that we are currently on a waiting list.  Notified the family of this as well and that someone from the hospice home would be in touch with them if a bed opened up.  Information faxed to referral intake. I called and spoke with [name], patient's daughter, to notify her of current waitlist and the procedure that would follow if a bed opened up over the weekend.  She was appreciative of my call and states he is on comfort care at the hospital and was happy with how he is being taken care of.

## 2019-05-23 NOTE — Progress Notes (Signed)
PT Cancellation Note  Patient Details Name: Brian Ferguson MRN: [id number] DOB: 1929/01/18   Cancelled Treatment:    Reason Eval/Treat Not Completed: (Per chart review, patient now transitioned to comfort care, awaiting bed at hospice home.  Will complete PT orders; please re-consult should goals of care/needs change.)  Lashica Hannay H. Owens Shark, PT, DPT, NCS 05/23/19, 10:50 PM [phone]

## 2019-05-23 NOTE — Consult Note (Signed)
I have placed a request via Secure Chat to Dr. Benjie Karvonen requesting photos of the wound areas of concern to be placed in the EMR.  [place], [place], [place]

## 2019-05-23 NOTE — TOC Progression Note (Signed)
Transition of Care [facility]) - Progression Note    Patient Details  Name: Brian Ferguson MRN: [id number] Date of Birth: 03-May-1929  Transition of Care [facility]) CM/SW Contact  Latanya Maudlin, RN Phone Number: 05/23/2019, 3:01 PM  Clinical Narrative:  TOC consulted to assist with discharge to hospice facility. Spoke to daughter [name] at bedside. She prefers to go to [place]. Patient is currently on comfort care. Referral placed with Marcene Brawn from [facility]. She has received referral for hospice home bed but there are currently no beds available. Patient will await bed to facility.     Expected Discharge Plan: [place] Barriers to Discharge: Hospice Bed not available  Expected Discharge Plan and Services Expected Discharge Plan: [place]   Discharge Planning Services: CM Consult   Living arrangements for the past 2 months: Single Family Home Expected Discharge Date: 05/23/19                           [facility] Agency: [facility]         Social Determinants of Health (SDOH) Interventions    Readmission Risk Interventions Readmission Risk Prevention Plan 05/23/2019  Post Dischage Appt Complete  Medication Screening Complete  Transportation Screening Complete  Some recent data might be hidden

## 2019-05-23 NOTE — Progress Notes (Signed)
[name] [place] NAME: Brian Ferguson    MR#:  [id number]  DATE OF BIRTH:  27-Jun-1929  SUBJECTIVE:   Saying I want to please help me out of here   REVIEW OF SYSTEMS:  Patient seems confused unable to obtain good review of systems   Tolerating Diet: Via J-tube      DRUG ALLERGIES:  No Known Allergies  VITALS:  Blood pressure 112/63, pulse 90, temperature 97.6 F (36.4 C), temperature source Oral, resp. rate 17, height 5\' 10"  (1.778 m), weight 55.5 kg, SpO2 92 %.  PHYSICAL EXAMINATION:  Constitutional: Appears cachectic, malnourished critically ill. HENT: Normocephalic. Marland Kitchen Oropharynx is clear and moist.  Eyes: Conjunctivae and EOM are normal. PERRLA, no scleral icterus.  Neck: Normal ROM. Neck supple. No JVD. No tracheal deviation. CVS: RRR, S1/S2 +, no murmurs, no gallops, no carotid bruit.  Pulmonary: Decreased breath sounds throughout.  Abdominal: J-tube in place without abdominal tenderness, rebound Musculoskeletal: Normal range of motion.  1+ lower extremity edema and no tenderness.  Neuro: Alert.  No obvious focal deficit Psychiatric: Confused   LABORATORY PANEL:   CBC No results for input(s): WBC, HGB, HCT, PLT in the last 168 hours. ------------------------------------------------------------------------------------------------------------------  Chemistries  Recent Labs  Lab 05/23/19 0454  NA 145  K 3.8  CL 107  CO2 31  GLUCOSE 139*  BUN 21  CREATININE 0.58*  CALCIUM 7.8*  MG 2.1   ------------------------------------------------------------------------------------------------------------------  Cardiac Enzymes No results for input(s): TROPONINI in the last 168 hours. ------------------------------------------------------------------------------------------------------------------  RADIOLOGY:  Dg Chest Port 1 View  Result Date: 05/21/2019 CLINICAL DATA:  Productive cough, history of stomach cancer,  ex smoker EXAM: PORTABLE CHEST 1 VIEW COMPARISON:  05/16/2019 FINDINGS: Slight interval increase in heterogeneous airspace opacity of the perihilar and lower left lung. Unchanged elevation of the right hemidiaphragm with bandlike scarring or atelectasis. The heart and mediastinum are unremarkable. IMPRESSION: Slight interval increase in heterogeneous airspace opacity of the perihilar and lower left lung. Unchanged elevation of the right hemidiaphragm with bandlike scarring or atelectasis. The heart and mediastinum are unremarkable. Electronically Signed   By: Eddie Candle M.D.   On: 05/21/2019 15:25     ASSESSMENT AND PLAN:   85 year old male with a remote history of gastric cancer who was initially sent from GI office due to adult failure to thrive with plans for surgical feeding tube.  1.  Acute hypoxic respiratory failure with aspiration pneumonia: Patient has been treated for 7 days of antibiotics for aspiration pneumonia. Continue oxygen Aspiration precautions 2.  Acute encephalopathy/delirium from prolonged hospitalization: Continue Seroquel  3.  Severe protein calorie malnutrition with cachexia, weight loss and dysphasia: J-tube has been placed by general surgery.  4.  Incidental finding of right upper lobe pulmonary lesion: No further work-up at this time  5.  Electrolyte abnormalities: These are being repleted by pharmacy.  6.  Urinary retention: Continue Foley catheter  Patient is critically ill.  He has very poor prognosis. It is highly recommended that patient have hospice care services/comfort care   Family to decide today on comfort care/hospice services.  Management plans discussed with the patient and he is in agreement.  CODE STATUS: dnr  TOTAL TIME TAKING CARE OF THIS PATIENT: 22 minutes.     POSSIBLE D/C ??, DEPENDING ON CLINICAL CONDITION.   Bettey Costa M.D on 05/23/2019 at 10:10 AM  Between 7am to 6pm - Pager - [phone] After 6pm go to [email or web address] -  password  [email or web address]  [facility]  Office  [phone]  CC: Primary care physician; Olin Hauser, DO  Note: This dictation was prepared with Dragon dictation along with smaller phrase technology. Any transcriptional errors that result from this process are unintentional.

## 2019-05-23 NOTE — Progress Notes (Addendum)
I spoke with patient daughter, [name] today.  Discussed my clinical findings.  Patient has overall very poor prognosis.  Patient very cachectic with protein calorie malnutrition.  Patient has feeding tube however is not improving.  My recommendation is hospice.  She is in full agreement.  Plan of care were discussed with clinical social worker.  If there is a bed available hospice home patient may be able to be discharged.  Patient will be on comfort measures until bed available at hospice home  ACP 30 minutes

## 2019-05-24 LAB — GLUCOSE, CAPILLARY: Glucose-Capillary: 116 mg/dL — ABNORMAL HIGH (ref 70–99)

## 2019-05-24 NOTE — Progress Notes (Signed)
[facility] at [place] NAME: Brian Ferguson    MR#:  [id number]  DATE OF BIRTH:  07-01-1929  SUBJECTIVE:   I want about a year  REVIEW OF SYSTEMS:    Unable to obtain patient is confused/delirious        DRUG ALLERGIES:  No Known Allergies  VITALS:  Blood pressure (!) 127/53, pulse 97, temperature 97.8 F (36.6 C), temperature source Oral, resp. rate 17, height 5\' 10"  (1.778 m), weight 55.5 kg, SpO2 94 %.  PHYSICAL EXAMINATION:  Constitutional: Thin frail cachectic confused     LABORATORY PANEL:   CBC No results for input(s): WBC, HGB, HCT, PLT in the last 168 hours. ------------------------------------------------------------------------------------------------------------------  Chemistries  Recent Labs  Lab 05/23/19 0454  NA 145  K 3.8  CL 107  CO2 31  GLUCOSE 139*  BUN 21  CREATININE 0.58*  CALCIUM 7.8*  MG 2.1   ------------------------------------------------------------------------------------------------------------------  Cardiac Enzymes No results for input(s): TROPONINI in the last 168 hours. ------------------------------------------------------------------------------------------------------------------  RADIOLOGY:  No results found.   ASSESSMENT AND PLAN:   84 year old male with a remote history of gastric cancer who was initially sent from GI office due to adult failure to thrive with plansfor surgical feeding tube.  1. Acute hypoxic respiratory failure with aspiration pneumonia: Patient has been treated for 7 days of antibiotics for aspiration pneumonia. O2 PRN  2. Acute encephalopathy/delirium from prolonged hospitalization:   3. Severe protein calorie malnutrition with cachexia, weight loss and dysphasia: J-tube has been placed by general surgery.  4. Incidental finding of right upper lobe pulmonary lesion: No further work-up at this time  5. Electrolyte abnormalities:  These were being repleted by pharmacy PRN.  6. Urinary retention: Continue Foley catheter  Patient has very poor prognosis.  Patient is on comfort care.  Awaiting hospice home bed.  Plan of care discussed with patient's daughter.      CODE STATUS: dnr  TOTAL TIME TAKING CARE OF THIS PATIENT: 13 minutes.     POSSIBLE D/C today hospice home   Bettey Costa M.D on 05/24/2019 at 9:28 AM  Between 7am to 6pm - Pager - [phone] After 6pm go to [email or web address] - password [email or web address]  Office  [phone]  CC: Primary care physician; Olin Hauser, DO  Note: This dictation was prepared with Dragon dictation along with smaller phrase technology. Any transcriptional errors that result from this process are unintentional.

## 2019-05-24 NOTE — Progress Notes (Signed)
Pt alert and oriented x4. Fatigued, denying pain or discomfort or SOB at this time. Family at the bedside.

## 2019-06-09 ENCOUNTER — Ambulatory Visit: Payer: 59 | Admitting: Gastroenterology

## 2019-06-17 NOTE — Progress Notes (Signed)
Nutrition Brief Follow-Up Note  Chart reviewed. Patient has now transitioned to comfort care.   No further nutrition interventions warranted at this time. Please re-consult RD as needed.   Willey Blade, MS, [place], LDN Office: [phone] Pager: [phone] After Hours/Weekend Pager: [phone]

## 2019-06-17 NOTE — Progress Notes (Signed)
Brian Ferguson at [place] NAME: Brian Ferguson    MR#:  [id number]  DATE OF BIRTH:  Sep 20, 1929  SUBJECTIVE:   Patient very lethargic this am barely opens eyes  REVIEW OF SYSTEMS:    Unable to obtain patient lethargic       DRUG ALLERGIES:  No Known Allergies  VITALS:  Blood pressure (!) 93/50, pulse (!) 111, temperature (!) 101.1 F (38.4 C), temperature source Axillary, resp. rate (!) 35, height 5\' 10"  (1.778 m), weight 54.8 kg, SpO2 (!) 57 %.  PHYSICAL EXAMINATION:  Constitutional: Thin frail cachectic      LABORATORY PANEL:   CBC No results for input(s): WBC, HGB, HCT, PLT in the last 168 hours. ------------------------------------------------------------------------------------------------------------------  Chemistries  Recent Labs  Lab 05/23/19 0454  NA 145  K 3.8  CL 107  CO2 31  GLUCOSE 139*  BUN 21  CREATININE 0.58*  CALCIUM 7.8*  MG 2.1   ------------------------------------------------------------------------------------------------------------------  Cardiac Enzymes No results for input(s): TROPONINI in the last 168 hours. ------------------------------------------------------------------------------------------------------------------  RADIOLOGY:  No results found.   ASSESSMENT AND PLAN:   85 year old male with a remote history of gastric cancer who was initially sent from GI office due to adult failure to thrive with plansfor surgical feeding tube.  1. Acute hypoxic respiratory failure with aspiration pneumonia: Patient completed 7 day course of  antibiotics for aspiration pneumonia. O2 PRN  2. Acute encephalopathy/delirium from prolonged hospitalization:   3. Severe protein calorie malnutrition with cachexia, weight loss and dysphasia: J-tube  placed by general surgery.  4. Incidental finding of right upper lobe pulmonary lesion: No further work-up at this time  5. Electrolyte  abnormalities: These were being repleted by pharmacy PRN.  6. Urinary retention: Continue Foley catheter  Patient has very poor prognosis.  Patient is on comfort care.  Awaiting hospice home bed.  Marland Kitchen     CODE STATUS: dnr  TOTAL TIME TAKING CARE OF THIS PATIENT: 11 minutes.     POSSIBLE D/C today hospice home   Brian Ferguson M.D on Jun 23, 2019 at 10:02 AM  Between 7am to 6pm - Pager - [phone] After 6pm go to [email or web address] - password [email or web address]  Office  [phone]  CC: Primary care physician; Brian Hauser, DO  Note: This dictation was prepared with Dragon dictation along with smaller phrase technology. Any transcriptional errors that result from this process are unintentional.

## 2019-06-17 NOTE — Progress Notes (Signed)
Ch received a page regarding the death of the pt in 104. Pt family was at bedside upon arrival. Ch asked guided questions for family to reflect on the pt's transition. Pt had been craving food for about 2 weeks prior to his demise. Pt was not a candidate to receive a G-tube. As shared by the daughter and grandson, the food that the pt would consume would go into his windpipe. Ch understood the lack of quality of life that the family shared that the pt was experiencing. Pt daughter shared that she had accepted her father's desire to transition and hopes to fulfill his wishes to celebrate his life with a [employer] funeral. Elige Ko gave a farewell prayer which the family appreciated.    06/20/2019 1900  Clinical Encounter Type  Visited With Patient and family together;Health care provider  Visit Type Psychological support;Spiritual support;Social support;Death  Referral From Nurse  Consult/Referral To Chaplain  Spiritual Encounters  Spiritual Needs Emotional;Grief support;Prayer  Stress Factors  Patient Stress Factors Exhausted;Health changes;Major life changes  Family Stress Factors Major life changes

## 2019-06-17 DEATH — deceased

## 2019-09-08 ENCOUNTER — Ambulatory Visit: Payer: Medicare Other | Admitting: Podiatry

## 2020-08-21 IMAGING — RF NASO G TUBE PLACEMENT WITH FL AND WITH RAD
1 series · 3 of 3 positions shown · IV contrast (agent unspecified)
Comparison: CT 05/07/2019.

CLINICAL DATA: Feeding tube needed.

EXAM:
NASO G TUBE PLACEMENT WITH FL AND WITH RAD
CONTRAST:  None.
FLUOROSCOPY TIME:  Fluoroscopy Time:  4 minutes 42 seconds
Radiation Exposure Index (if provided by the fluoroscopic device):
43.0 mGy

[Series 1: fluoro_iodine_singleshot_bw · 0.17mm/px · 3 of 3 slices shown]
[im 1/3]
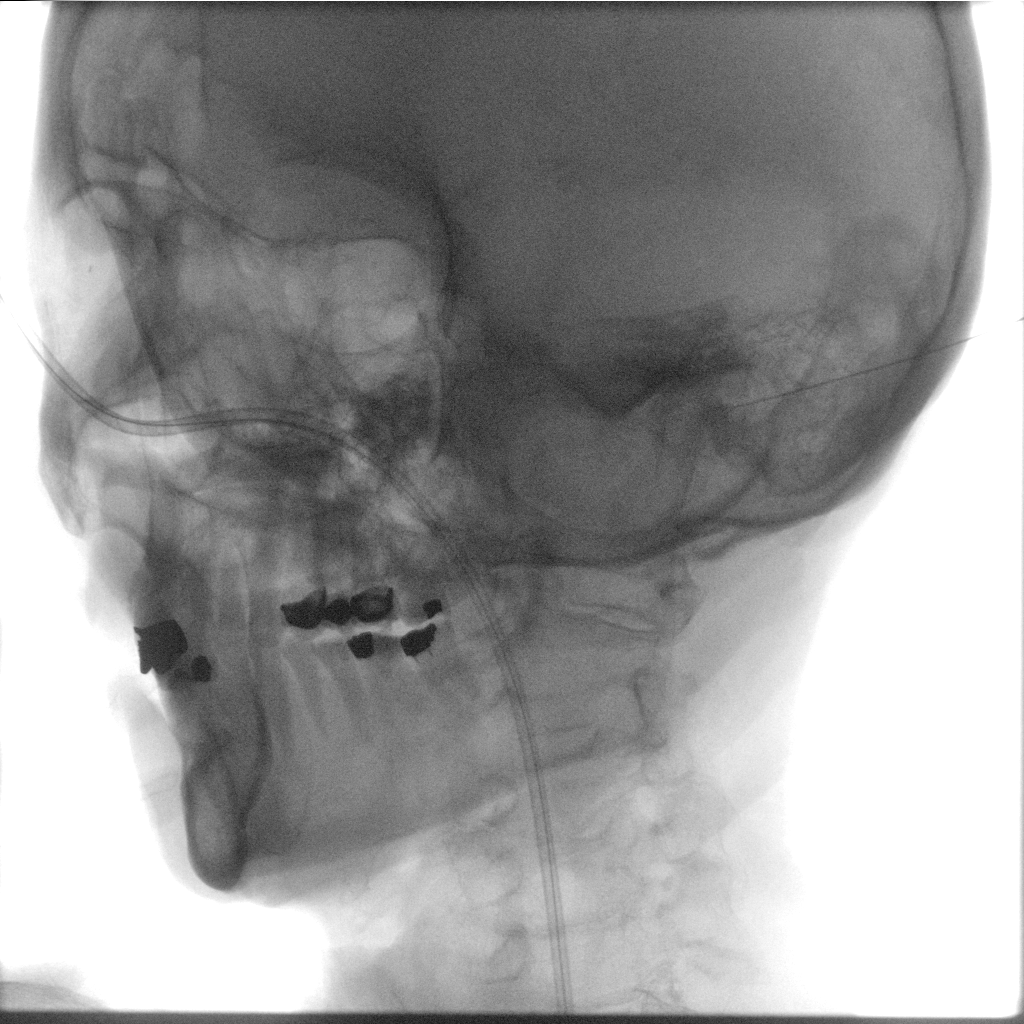
[im 2/3]
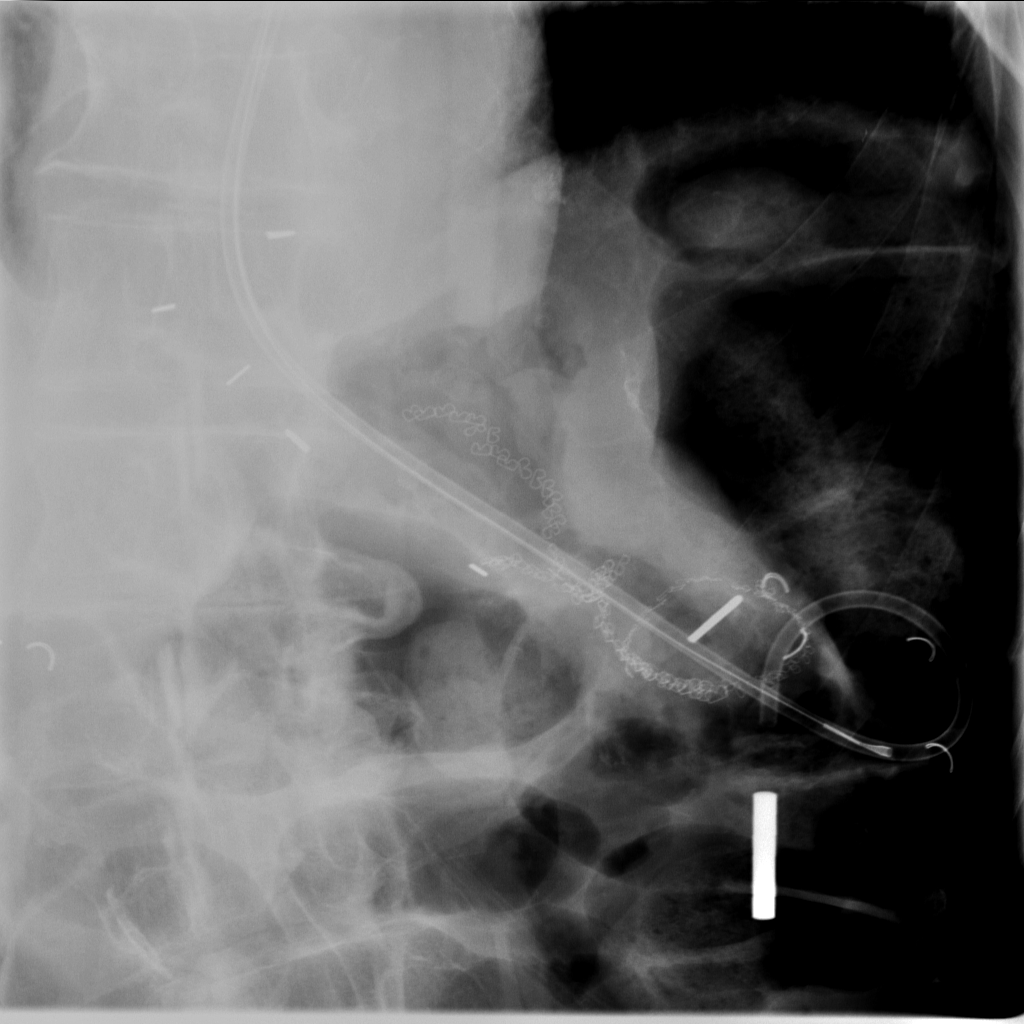
[im 3/3]
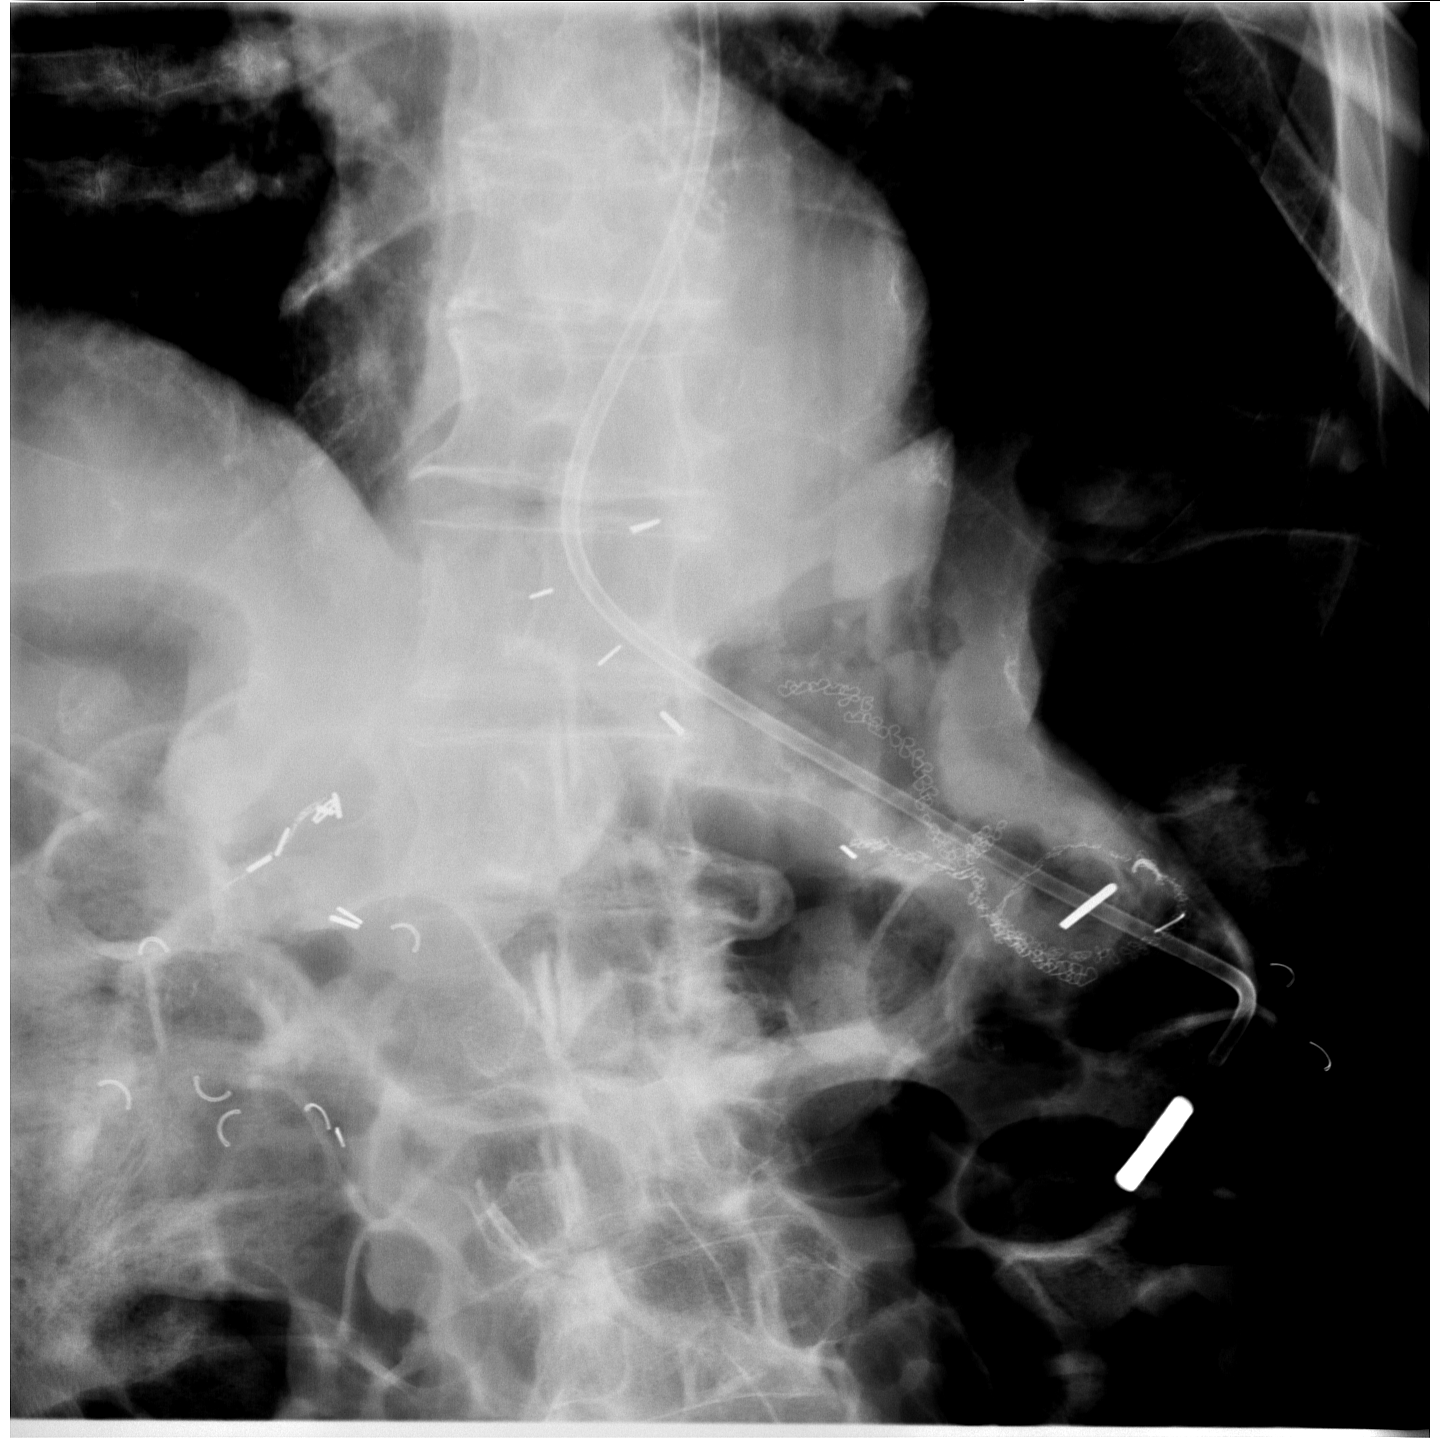

[3 of 3 positions shown; findings below may reference images not displayed]

FINDINGS: A feeding tube was placed through the left nares down the esophagus
into the stomach under fluoroscopic guidance. There were no
complications.
IMPRESSION: Successful feeding tube placement.

## 2020-08-24 IMAGING — RF NASO G TUBE PLACEMENT WITH FL AND WITH RAD
1 series · 1 of 1 positions shown · IV contrast (agent unspecified)
Comparison: None.

CLINICAL DATA: Malnutrition, tube placement

EXAM:
NASO G TUBE PLACEMENT WITH FL AND WITH RAD
CONTRAST:  10 mL [id number]
FLUOROSCOPY TIME:  Fluoroscopy Time:  [DATE]
Number of Acquired Spot Images: 1

[Series 4: cp_standard · 0.26mm/px · 1 of 1 slices shown]
[im 1/1]
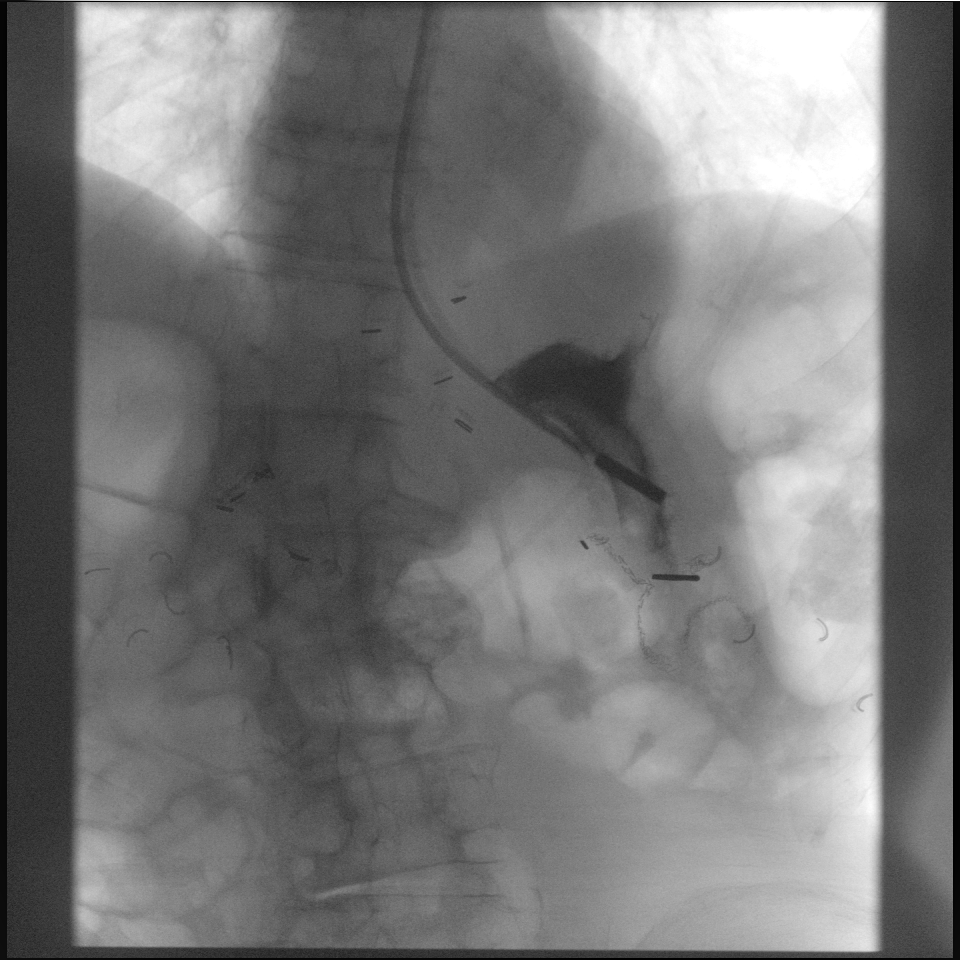

[1 of 1 positions shown; findings below may reference images not displayed]

FINDINGS: Using lidocaine [place]ee for local anesthesia, a weighted enteric
feeding tube was advanced through the right nostril to the partially
resected stomach under fluoroscopic guidance without complication. A
small volume of water soluble contrast was injected to confirm
intragastric tube location and the tube was secured.
IMPRESSION: Successful fluoroscopic placement of weighted enteric feeding tube
within the partially resected stomach.

## 2020-08-27 IMAGING — CT CT HEAD WITHOUT CONTRAST
3 series · 15 of 47 positions shown, 18 images · non-contrast
Comparison: None.

CLINICAL DATA: Dysphagia.  Lung nodule.

EXAM:
CT HEAD WITHOUT CONTRAST
TECHNIQUE: Contiguous axial images were obtained from the base of the skull
through the vertex without intravenous contrast.

[Series 3: head wo · axial · 0.46mm/px · z∈[-146,-6]mm · 9 of 34 slices shown, 12 images]
[im 3/34  brain]
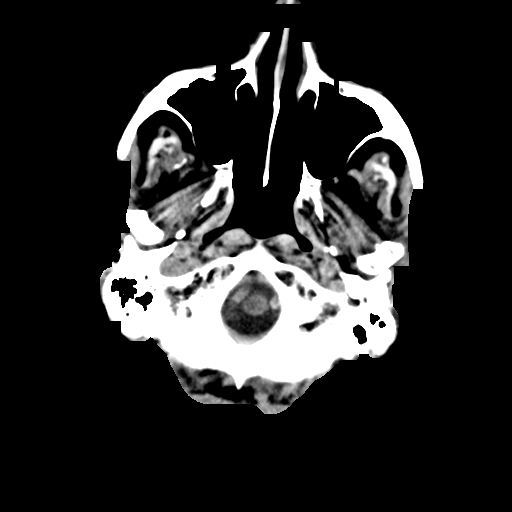
[im 3/34  bone]
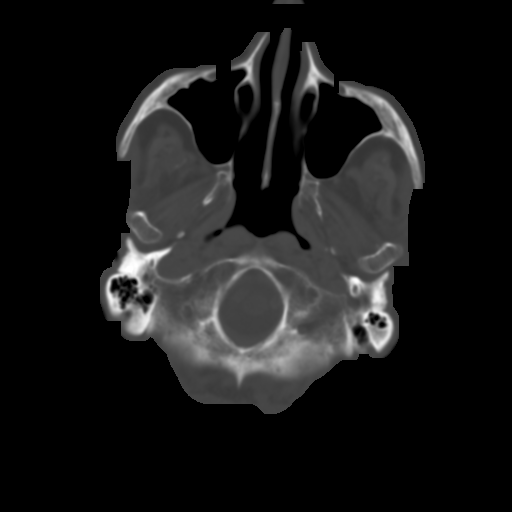
[im 6/34  brain]
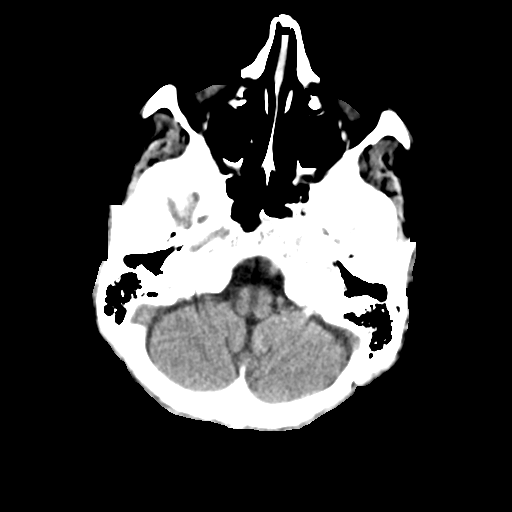
[im 10/34  brain]
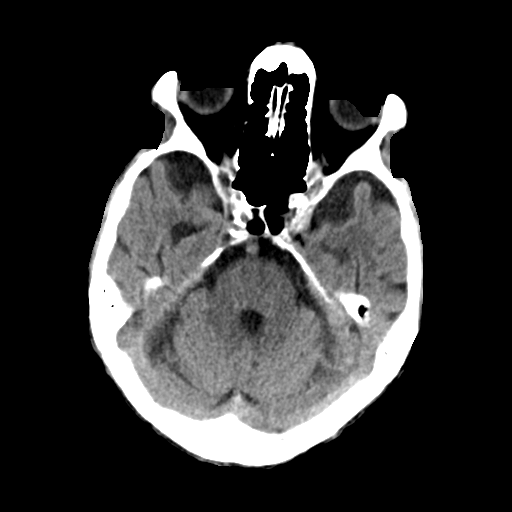
[im 13/34  brain]
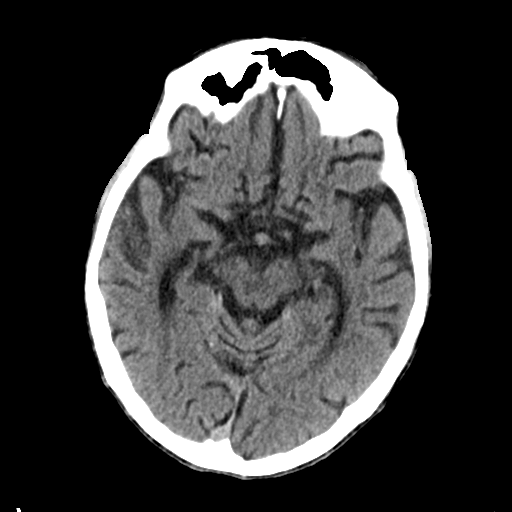
[im 18/34  brain]
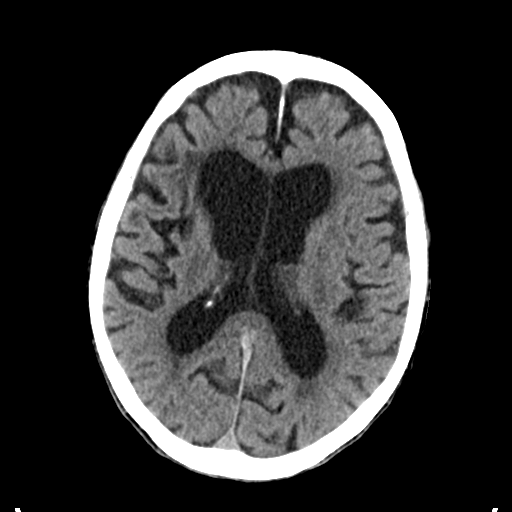
[im 18/34  bone]
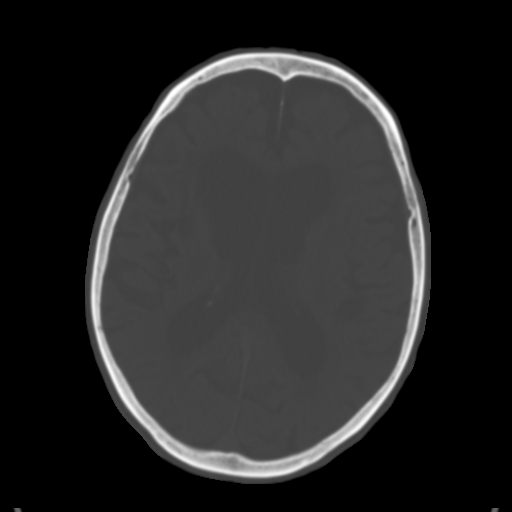
[im 21/34  brain]
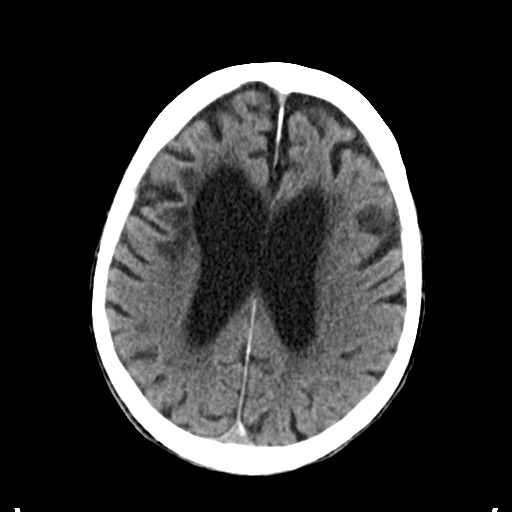
[im 24/34  brain]
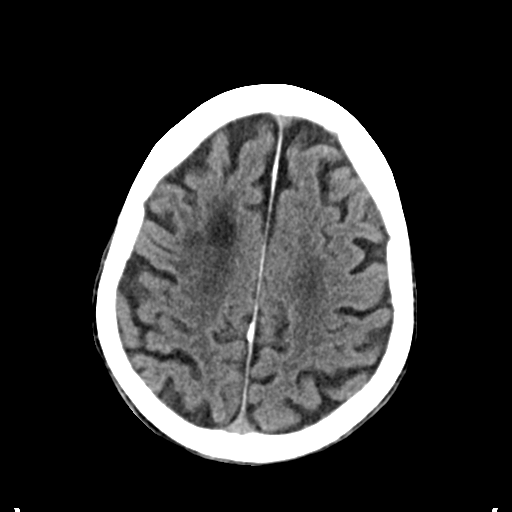
[im 28/34  brain]
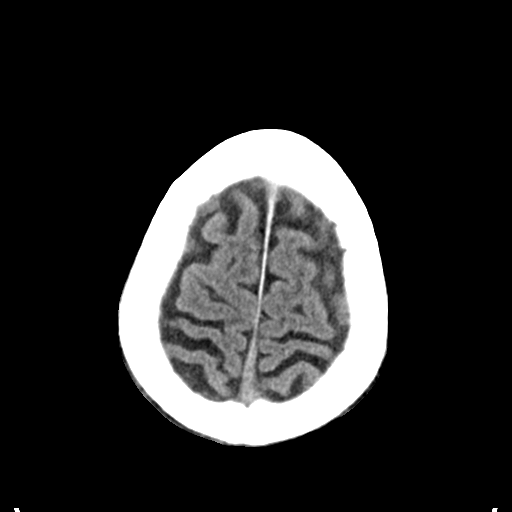
[im 31/34  brain]
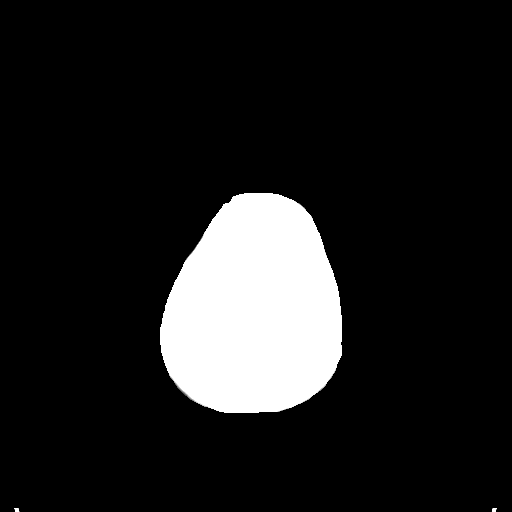
[im 31/34  bone]
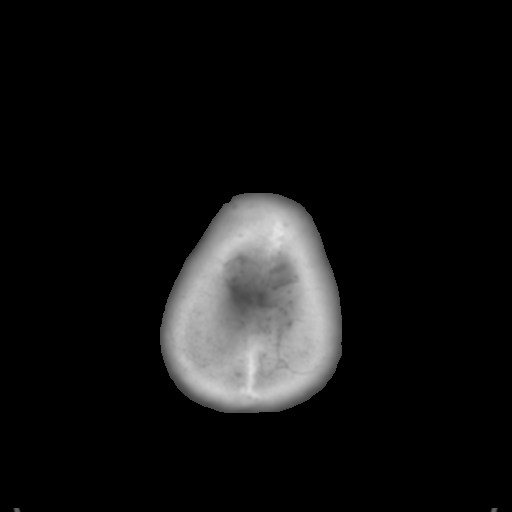

[Series 4: coronal soft tissue · coronal · 0.36mm/px · 3 of 77 slices shown]
[im 26/77  brain]
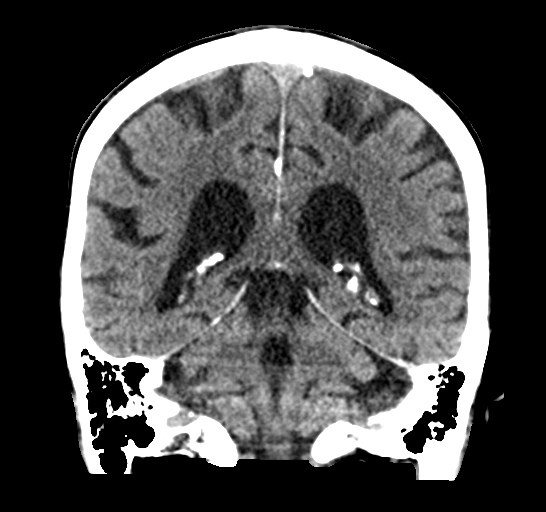
[im 34/77  brain]
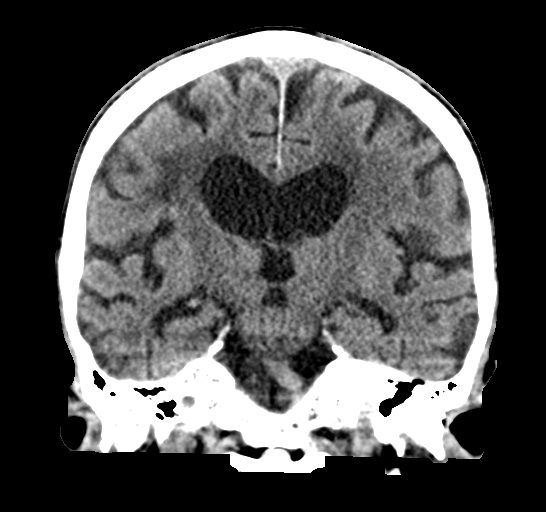
[im 43/77  brain]
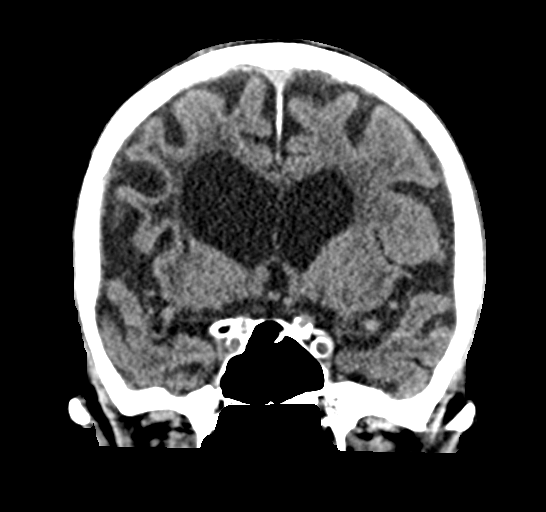

[Series 5: sagittal soft tissue · sagittal · 0.35mm/px · 3 of 56 slices shown]
[im 19/56  brain]
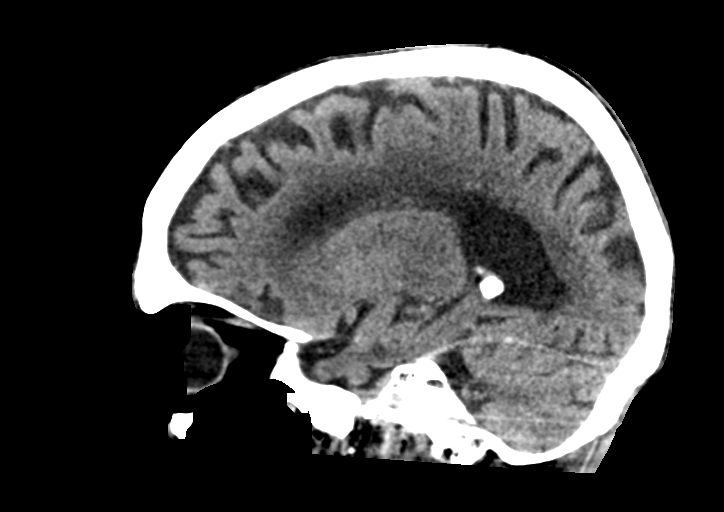
[im 28/56  brain]
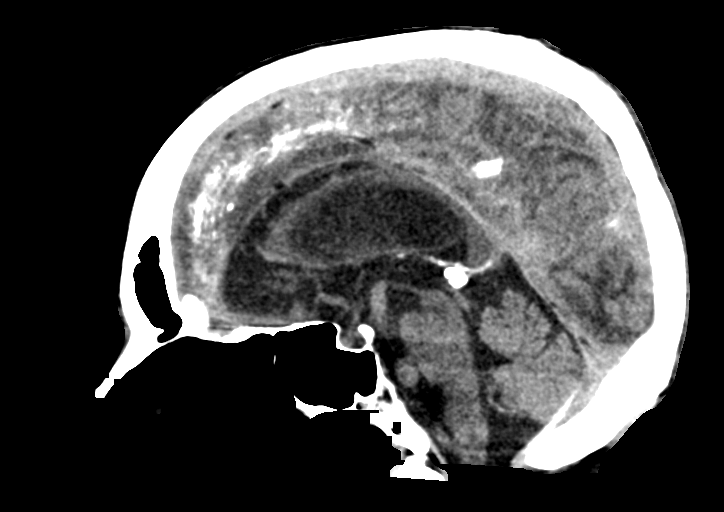
[im 37/56  brain]
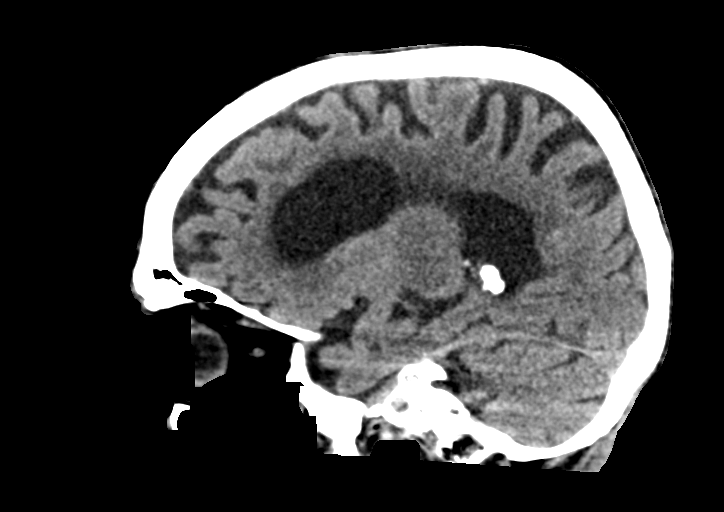

[15 of 47 positions shown; findings below may reference images not displayed]

FINDINGS: Brain: Moderate atrophy. Chronic infarct right frontal lobe with
volume loss. Chronic ischemic change in the white matter
bilaterally. Small chronic infarct left cerebellum.

Negative for acute infarct. Negative for hemorrhage mass or edema.
No midline shift.

Vascular: Negative for hyperdense vessel. Atherosclerotic
calcification in the cavernous carotid bilaterally.

Skull: Negative

Sinuses/Orbits: Paranasal sinuses clear.  Bilateral cataract surgery

Other: None
IMPRESSION: Atrophy and chronic ischemic change.  No acute abnormality.
